# Patient Record
Sex: Female | Born: 1950 | Race: Black or African American | Hispanic: No | Smoking: Current some day smoker
Health system: Southern US, Community
[De-identification: ages and names within clinical notes are randomized; demographics above are authoritative.]

## PROBLEM LIST (undated history)

## (undated) DIAGNOSIS — R9431 Abnormal electrocardiogram [ECG] [EKG]: Secondary | ICD-10-CM

## (undated) DIAGNOSIS — C139 Malignant neoplasm of hypopharynx, unspecified: Secondary | ICD-10-CM

## (undated) NOTE — *Deleted (*Deleted)
Ms. 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 *** Using fluoride trays daily? *** Last ENT visit was on: Not since diagnosis Other notable issues, if any: ***  **vitals**

---

## 1999-01-21 ENCOUNTER — Emergency Department (HOSPITAL_COMMUNITY): Admission: EM | Admit: 1999-01-21 | Discharge: 1999-01-21 | Payer: Self-pay | Admitting: Emergency Medicine

## 2013-11-14 ENCOUNTER — Emergency Department (INDEPENDENT_AMBULATORY_CARE_PROVIDER_SITE_OTHER)
Admission: EM | Admit: 2013-11-14 | Discharge: 2013-11-14 | Disposition: A | Discharge status: Home / Self Care | Payer: 59 | Source: Home / Self Care | Attending: Family Medicine | Admitting: Family Medicine

## 2013-11-14 ENCOUNTER — Encounter (HOSPITAL_COMMUNITY): Payer: Self-pay | Admitting: Emergency Medicine

## 2013-11-14 DIAGNOSIS — B86 Scabies: Secondary | ICD-10-CM

## 2013-11-14 MED ORDER — PERMETHRIN 5 % EX CREA: 1.0000 "application " | TOPICAL_CREAM | Freq: Once | CUTANEOUS | Status: DC

## 2013-11-14 MED ORDER — TRIAMCINOLONE ACETONIDE 0.1 % EX CREA: 1.0000 "application " | TOPICAL_CREAM | Freq: Two times a day (BID) | CUTANEOUS | Status: DC

## 2013-11-14 NOTE — ED Notes (Signed)
Reports rash on arms and abdomen since 7/30.   Denies any recent hotel stays, changes in soaps/detergents, or medications.  No otc treatments tried.

## 2013-11-14 NOTE — ED Provider Notes (Signed)
Tina Kennedy is a 63 y.o. female who presents to Urgent Care today for rash. Patient notes small. Papules scattered across her trunk and extremities. These have been present for about one week. She has not tried any treatment for them yet. She denies any medications fevers chills nausea vomiting or diarrhea. She denies any soaps shampoos detergents etc. She feels well otherwise.   History reviewed. No pertinent past medical history. History  Substance Use Topics  . Smoking status: Current Some Day Smoker  . Smokeless tobacco: Not on file  . Alcohol Use: No   ROS as above Medications: No current facility-administered medications for this encounter.   Current Outpatient Prescriptions  Medication Sig Dispense Refill  . permethrin (ACTICIN) 5 % cream Apply 1 application topically once.  120 g  0  . triamcinolone cream (KENALOG) 0.1 % Apply 1 application topically 2 (two) times daily.  30 g  1    Exam:  BP 158/83  Pulse 84  Temp(Src) 98.7 F (37.1 C) (Oral)  Resp 18  SpO2 95% Gen: Well NAD HEENT: EOMI,  MMM Lungs: Normal work of breathing. CTABL Heart: RRR no MRG Abd: NABS, Soft. Nondistended, Nontender Exts: Brisk capillary refill, warm and well perfused.  Skin: Small scattered excoriated. Papules on trunk and extremities.  No results found for this or any previous visit (from the past 24 hour(s)). No results found.  Assessment and Plan: 63 y.o. female with probable scabies. Plan to treat with permethrin and triamcinolone.  Recommend patient followup with her primary care provider for hypertension.  Discussed warning signs or symptoms. Please see discharge instructions. Patient expresses understanding.   This note was created using Systems analyst. Any transcription errors are unintended.    Gregor Hams, MD 11/14/13 [phone]

## 2013-11-14 NOTE — Discharge Instructions (Signed)
Thank you for coming in today. Apply the permethrin cream from the neck down at night and wash off in the morning. Use Benadryl at night as needed for itching. Use Gold Bond Itch as needed. Come back if not getting better or worsening. Use triamcinolone cream as needed for itching.    Scabies Scabies are small bugs (mites) that burrow under the skin and cause red bumps and severe itching. These bugs can only be seen with a microscope. Scabies are highly contagious. They can spread easily from person to person by direct contact. They are also spread through sharing clothing or linens that have the scabies mites living in them. It is not unusual for an entire family to become infected through shared towels, clothing, or bedding.  HOME CARE INSTRUCTIONS   Your caregiver may prescribe a cream or lotion to kill the mites. If cream is prescribed, massage the cream into the entire body from the neck to the bottom of both feet. Also massage the cream into the scalp and face if your child is less than 38 year old. Avoid the eyes and mouth. Do not wash your hands after application.  Leave the cream on for 8 to 12 hours. Your child should bathe or shower after the 8 to 12 hour application period. Sometimes it is helpful to apply the cream to your child right before bedtime.  One treatment is usually effective and will eliminate approximately 95% of infestations. For severe cases, your caregiver may decide to repeat the treatment in 1 week. Everyone in your household should be treated with one application of the cream.  New rashes or burrows should not appear within 24 to 48 hours after successful treatment. However, the itching and rash may last for 2 to 4 weeks after successful treatment. Your caregiver may prescribe a medicine to help with the itching or to help the rash go away more quickly.  Scabies can live on clothing or linens for up to 3 days. All of your child's recently used clothing, towels,  stuffed toys, and bed linens should be washed in hot water and then dried in a dryer for at least 20 minutes on high heat. Items that cannot be washed should be enclosed in a plastic bag for at least 3 days.  To help relieve itching, bathe your child in a cool bath or apply cool washcloths to the affected areas.  Your child may return to school after treatment with the prescribed cream. SEEK MEDICAL CARE IF:   The itching persists longer than 4 weeks after treatment.  The rash spreads or becomes infected. Signs of infection include red blisters or yellow-tan crust. Document Released: 03/28/2005 Document Revised: 06/20/2011 Document Reviewed: 08/06/2008 [facility] Patient Information 2015 [employer], Preemption. This information is not intended to replace advice given to you by your health care provider. Make sure you discuss any questions you have with your health care provider.

## 2013-11-28 ENCOUNTER — Ambulatory Visit: Payer: 59 | Admitting: Internal Medicine

## 2013-12-24 ENCOUNTER — Ambulatory Visit: Payer: 59 | Attending: Internal Medicine | Admitting: Internal Medicine

## 2013-12-24 ENCOUNTER — Encounter: Payer: Self-pay | Admitting: Internal Medicine

## 2013-12-24 VITALS — BP 134/80 | HR 85 | Temp 98.9°F | Resp 16 | Ht 65.0 in | Wt 155.0 lb

## 2013-12-24 DIAGNOSIS — Z2821 Immunization not carried out because of patient refusal: Secondary | ICD-10-CM

## 2013-12-24 DIAGNOSIS — Z0000 Encounter for general adult medical examination without abnormal findings: Secondary | ICD-10-CM | POA: Diagnosis not present

## 2013-12-24 DIAGNOSIS — F17200 Nicotine dependence, unspecified, uncomplicated: Secondary | ICD-10-CM | POA: Diagnosis not present

## 2013-12-24 DIAGNOSIS — H538 Other visual disturbances: Secondary | ICD-10-CM | POA: Diagnosis not present

## 2013-12-24 DIAGNOSIS — Z23 Encounter for immunization: Secondary | ICD-10-CM

## 2013-12-24 LAB — POCT URINALYSIS DIPSTICK
Bilirubin, UA: NEGATIVE
Glucose, UA: NEGATIVE
Ketones, UA: NEGATIVE
NITRITE UA: NEGATIVE
PH UA: 7
PROTEIN UA: NEGATIVE
RBC UA: NEGATIVE
Spec Grav, UA: 1.01
UROBILINOGEN UA: 1

## 2013-12-24 MED ORDER — BENZONATATE 100 MG PO CAPS: 100.0000 mg | ORAL_CAPSULE | Freq: Two times a day (BID) | ORAL | Status: DC | PRN

## 2013-12-24 NOTE — Progress Notes (Signed)
Pt is here to establish care. Pt is requesting a physical.

## 2013-12-24 NOTE — Progress Notes (Signed)
Patient ID: Tina Kennedy, female   DOB: 10-25-1950, 63 y.o.   MRN: [id number]   [id number]  [id number]  DOB - 01-13-1951  CC:  Chief Complaint  Patient presents with  . Establish Care       HPI: Tina Kennedy is a 63 y.o. female here today to establish medical care.  Patient presents today with no past medical history for a annual physical exam.  She states that her last pap smear and mammogram where both three years ago and were both normal.  She states that she has never had a colonoscopy and is unsure of last Tdap vaccine.  She reports that she does smoke cigarettes and use ocasional marijuana.  She drinks about 4 cans of beer on weekend days.  She is retired but still does some temporary work on the side to help pay her bills.   Patient has No headache, No chest pain, No abdominal pain - No Nausea, No new weakness tingling or numbness, No Cough - SOB.  No Known Allergies History reviewed. No pertinent past medical history. Current Outpatient Prescriptions on File Prior to Visit  Medication Sig Dispense Refill  . permethrin (ACTICIN) 5 % cream Apply 1 application topically once.  120 g  0  . triamcinolone cream (KENALOG) 0.1 % Apply 1 application topically 2 (two) times daily.  30 g  1   No current facility-administered medications on file prior to visit.   Family History  Problem Relation Age of Onset  . Hypertension Mother   . Hypertension Sister    History   Social History  . Marital Status: Married    Spouse Name: N/A    Number of Children: N/A  . Years of Education: N/A   Occupational History  . Not on file.   Social History Main Topics  . Smoking status: Current Some Day Smoker  . Smokeless tobacco: Not on file  . Alcohol Use: No  . Drug Use: Yes    Special: Marijuana  . Sexual Activity: Yes   Other Topics Concern  . Not on file   Social History Narrative  . No narrative on file   Review of Systems  Constitutional: Negative.   HENT:  Negative.   Eyes: Positive for blurred vision.  Respiratory: Positive for cough and sputum production (occasionally). Negative for shortness of breath and wheezing.   Cardiovascular: Negative.   Gastrointestinal: Negative.   Genitourinary: Negative.   Musculoskeletal: Negative.   Skin: Negative.   Neurological: Negative.   Endo/Heme/Allergies: Negative.   Psychiatric/Behavioral: Positive for substance abuse. Negative for depression. The patient is not nervous/anxious.        Objective:   Filed Vitals:   12/24/13 1405  BP: 134/80  Pulse: 85  Temp: 98.9 F (37.2 C)  Resp: 16    Physical Exam: Constitutional: Patient appears well-developed and well-nourished. No distress. HENT: Normocephalic, atraumatic, External right and left ear normal. Oropharynx is clear and moist.  Eyes: Conjunctivae and EOM are normal. PERRLA, no scleral icterus. Neck: Normal ROM. Neck supple. No JVD. No tracheal deviation. No thyromegaly. CVS: RRR, S1/S2 +, no murmurs, no gallops, no carotid bruit.  Pulmonary: Effort and breath sounds normal, no stridor, rhonchi, wheezes, rales.  Abdominal: Soft. BS +, no distension, tenderness, rebound or guarding.  Musculoskeletal: Normal range of motion. No edema and no tenderness.  Lymphadenopathy: No lymphadenopathy noted, cervical Neuro: Alert. Normal reflexes, muscle tone coordination. No cranial nerve deficit. Skin: Skin is warm and dry. No rash noted. Not diaphoretic.  No erythema. No pallor. Psychiatric: Normal mood and affect. Behavior, judgment, thought content normal.  No results found for this basename: WBC, HGB, HCT, MCV, PLT   No results found for this basename: CREATININE, BUN, NA, K, CL, CO2    No results found for this basename: HGBA1C   Lipid Panel  No results found for this basename: chol, trig, hdl, cholhdl, vldl, ldlcalc       Assessment and plan:   Ramaya was seen today for establish care.  Diagnoses and associated orders for this  visit:  Annual physical exam - POCT urinalysis dipstick - CBC; Future - COMPLETE METABOLIC PANEL WITH GFR; Future - Lipid panel; Future - TSH; Future - Hemoglobin A1C; Future - HM COLONOSCOPY - MM Digital Screening; Future - Tdap vaccine greater than or equal to 7yo IM  Tobacco use disorder Smoking cessation discussed in detail  Influenza vaccine refused Explained benefits of vaccine    Return for need pap scheduled.     Chari Manning, NP-C [facility] [phone] 01/06/2014 9:59 PM

## 2014-07-11 ENCOUNTER — Encounter (HOSPITAL_COMMUNITY): Payer: Self-pay

## 2014-07-11 ENCOUNTER — Emergency Department (HOSPITAL_COMMUNITY)
Admission: EM | Admit: 2014-07-11 | Discharge: 2014-07-11 | Disposition: A | Discharge status: Home / Self Care | Payer: 59 | Attending: Emergency Medicine | Admitting: Emergency Medicine

## 2014-07-11 DIAGNOSIS — Z720 Tobacco use: Secondary | ICD-10-CM | POA: Insufficient documentation

## 2014-07-11 DIAGNOSIS — M5431 Sciatica, right side: Secondary | ICD-10-CM

## 2014-07-11 DIAGNOSIS — Z79899 Other long term (current) drug therapy: Secondary | ICD-10-CM | POA: Insufficient documentation

## 2014-07-11 MED ORDER — NAPROXEN 500 MG PO TABS: 500.0000 mg | ORAL_TABLET | Freq: Two times a day (BID) | ORAL | Status: DC

## 2014-07-11 MED ORDER — HYDROCODONE-ACETAMINOPHEN 5-325 MG PO TABS: 1.0000 | ORAL_TABLET | Freq: Four times a day (QID) | ORAL | Status: DC | PRN

## 2014-07-11 NOTE — ED Provider Notes (Signed)
CSN: [id number]     Arrival date & time 07/11/14  0849 History   First MD Initiated Contact with Patient 07/11/14 [phone]     Chief Complaint  Patient presents with  . Rectal Pain     (Consider location/radiation/quality/duration/timing/severity/associated sxs/prior Treatment) Patient is a 64 y.o. female presenting with leg pain. The history is provided by the patient.  Leg Pain Location:  Buttock Time since incident:  3 weeks Injury: no   Buttock location:  R buttock Pain details:    Quality:  Sharp and shooting   Radiates to:  R leg   Severity:  Moderate   Onset quality:  Gradual   Duration:  3 weeks   Timing:  Constant   Progression:  Worsening Chronicity:  New Relieved by:  Rest Exacerbated by: standing and prolonged sitting. Ineffective treatments:  Heat (otc meds) Associated symptoms: tingling   Associated symptoms: no back pain, no decreased ROM and no muscle weakness     History reviewed. No pertinent past medical history. History reviewed. No pertinent past surgical history. Family History  Problem Relation Age of Onset  . Hypertension Mother   . Hypertension Sister    History  Substance Use Topics  . Smoking status: Current Some Day Smoker    Types: Cigarettes  . Smokeless tobacco: Not on file  . Alcohol Use: No   OB History    No data available     Review of Systems  Musculoskeletal: Negative for back pain.  All other systems reviewed and are negative.     Allergies  Review of patient's allergies indicates no known allergies.  Home Medications   Prior to Admission medications   Medication Sig Start Date End Date Taking? Authorizing Provider  benzonatate (TESSALON) 100 MG capsule Take 1 capsule (100 mg total) by mouth 2 (two) times daily as needed for cough. 12/24/13   Lance Bosch, NP  permethrin (ACTICIN) 5 % cream Apply 1 application topically once. 11/14/13   Gregor Hams, MD  triamcinolone cream (KENALOG) 0.1 % Apply 1 application topically  2 (two) times daily. 11/14/13   Gregor Hams, MD   BP 165/86 mmHg  Pulse 94  Temp(Src) 97.9 F (36.6 C) (Oral)  Resp 18  Ht 5\' 5"  (1.651 m)  Wt 140 lb (63.504 kg)  BMI 23.30 kg/m2  SpO2 95% Physical Exam  Constitutional: She is oriented to person, place, and time. She appears well-developed and well-nourished. No distress.  HENT:  Head: Normocephalic and atraumatic.  Eyes: EOM are normal. Pupils are equal, round, and reactive to light.  Cardiovascular: Normal rate.   Pulmonary/Chest: Effort normal.  Abdominal: Soft.  Genitourinary: Rectum normal. Rectal exam shows no external hemorrhoid.  Musculoskeletal: Normal range of motion. She exhibits tenderness.  Tenderness over the sciatic notch.  No induration, fluctuance. No edema  Neurological: She is alert and oriented to person, place, and time. She has normal strength. No cranial nerve deficit or sensory deficit. Gait normal.  Skin: Skin is warm and dry. No rash noted.  Psychiatric: She has a normal mood and affect. Her behavior is normal.  Nursing note and vitals reviewed.   ED Course  Procedures (including critical care time) Labs Review Labs Reviewed - No data to display  Imaging Review No results found.   EKG Interpretation None      MDM   Final diagnoses:  Sciatica, right    Patient here with symptoms most consistent with sciatica. She has pain in her sciatic notch  that radiates into her leg. No difficulty ambulating or weakness. She has no evidence of hemorrhoids or rectal tenderness. Patient was started with anti-inflammatories and given follow-up.    [name], MD 07/11/14 [phone]

## 2014-07-11 NOTE — ED Notes (Signed)
Pt. Having pain on her rt. Buttocks area radiates into her rectum.  She can feel swelling on her rt. Cheek, no bleeing in her bowels last bm was last night.  NOrmal BM

## 2019-06-15 ENCOUNTER — Ambulatory Visit: Payer: Self-pay | Attending: Internal Medicine

## 2019-06-15 DIAGNOSIS — Z23 Encounter for immunization: Secondary | ICD-10-CM | POA: Insufficient documentation

## 2019-06-15 NOTE — Progress Notes (Signed)
   Covid-19 Vaccination Clinic  Name:  Tina Kennedy    MRN: [id number] DOB: 1950-09-13  06/15/2019  [name] was observed post Covid-19 immunization for 15 minutes without incident. She was provided with Vaccine Information Sheet and instruction to access the V-Safe system.   [name] was instructed to call 911 with any severe reactions post vaccine: Marland Kitchen Difficulty breathing  . Swelling of face and throat  . A fast heartbeat  . A bad rash all over body  . Dizziness and weakness   Immunizations Administered    Name Date Dose VIS Date Route   Pfizer COVID-19 Vaccine 06/15/2019 10:38 AM 0.3 mL 03/22/2019 Intramuscular   Manufacturer: [place]   Lot: [id number]   [place]: [id number]

## 2019-07-06 ENCOUNTER — Ambulatory Visit: Payer: Self-pay

## 2019-07-16 ENCOUNTER — Ambulatory Visit: Payer: Self-pay | Attending: Internal Medicine

## 2019-07-16 ENCOUNTER — Ambulatory Visit: Payer: Self-pay

## 2019-07-16 DIAGNOSIS — Z23 Encounter for immunization: Secondary | ICD-10-CM

## 2019-07-16 NOTE — Progress Notes (Signed)
   Covid-19 Vaccination Clinic  Name:  Tina Kennedy    MRN: [id number] DOB: 01/21/1951  07/16/2019  [name] was observed post Covid-19 immunization for 15 minutes without incident. She was provided with Vaccine Information Sheet and instruction to access the V-Safe system.   [name] was instructed to call 911 with any severe reactions post vaccine: Marland Kitchen Difficulty breathing  . Swelling of face and throat  . A fast heartbeat  . A bad rash all over body  . Dizziness and weakness   Immunizations Administered    Name Date Dose VIS Date Route   Pfizer COVID-19 Vaccine 07/16/2019 10:31 AM 0.3 mL 03/22/2019 Intramuscular   Manufacturer: [employer]   Lot: [id number]   [place]: [id number]

## 2019-08-02 ENCOUNTER — Ambulatory Visit (HOSPITAL_COMMUNITY)
Admission: EM | Admit: 2019-08-02 | Discharge: 2019-08-02 | Disposition: A | Discharge status: Home / Self Care | Payer: Medicare Other | Attending: Family Medicine | Admitting: Family Medicine

## 2019-08-02 ENCOUNTER — Other Ambulatory Visit: Payer: Self-pay

## 2019-08-02 ENCOUNTER — Encounter (HOSPITAL_COMMUNITY): Payer: Self-pay

## 2019-08-02 DIAGNOSIS — R1310 Dysphagia, unspecified: Secondary | ICD-10-CM | POA: Diagnosis not present

## 2019-08-02 DIAGNOSIS — R030 Elevated blood-pressure reading, without diagnosis of hypertension: Secondary | ICD-10-CM

## 2019-08-02 NOTE — Discharge Instructions (Signed)
Your blood pressure was noted to be elevated during your visit today. If you are currently taking medication for high blood pressure, please ensure you are taking this as directed. If you do not have a history of high blood pressure and your blood pressure remains persistently elevated, you may need to begin taking a medication at some point. You may return here within the next few days to recheck if unable to see your primary care provider or if do not have a one.  BP (!) 158/104 (BP Location: Right Arm)   Pulse 80   Temp 98.7 F (37.1 C) (Oral)   Resp 17   SpO2 98%

## 2019-08-02 NOTE — ED Provider Notes (Signed)
[place]   [id number] 08/02/19 Arrival Time: 0908  ASSESSMENT & PLAN:  1. Difficulty swallowing solids   2. Elevated blood pressure reading without diagnosis of hypertension     Benign abdominal exam. No indications for urgent abdominal/pelvic imaging at this time. Discussed.  Discussed need of endoscopy. See f/u below.    Discharge Instructions     Your blood pressure was noted to be elevated during your visit today. If you are currently taking medication for high blood pressure, please ensure you are taking this as directed. If you do not have a history of high blood pressure and your blood pressure remains persistently elevated, you may need to begin taking a medication at some point. You may return here within the next few days to recheck if unable to see your primary care provider or if do not have a one.  BP (!) 158/104 (BP Location: Right Arm)   Pulse 80   Temp 98.7 F (37.1 C) (Oral)   Resp 17   SpO2 98%     Follow-up Information    Schedule an appointment as soon as possible for a visit  with Gastroenterology, [facility].   Contact information: [place] [address] [phone]        Schedule an appointment as soon as possible for a visit  with [facility].   Specialty: Gastroenterology Contact information: [address] [place] [phone]           Reviewed expectations re: course of current medical issues. Questions answered. Outlined signs and symptoms indicating need for more acute intervention. Patient verbalized understanding. After Visit Summary given.   SUBJECTIVE: History from: patient. Tina Kennedy is a 69 y.o. female who presents with complaint of difficulty swallowing foods. Gradual onset; first noticed about one month ago. Has lost a couple of pounds. Tolerating PO fluids. "Just get choked when I try to eat". No voice changes. No abdominal pain.  Otherwise feels well. No h/o GERD. No GERD symptoms. No OTC tx. Symptoms gradually worsening. Normal bowel movements.  No LMP recorded. Patient is postmenopausal.   History reviewed. No pertinent surgical history.  Social History   Tobacco Use  Smoking Status Current Some Day Smoker  . Types: Cigarettes  50 years. Approx 1/2 PPD.  Increased blood pressure noted today. Reports that she has not been treated for hypertension in the past.  She reports no chest pain on exertion, no dyspnea on exertion, no swelling of ankles, no orthostatic dizziness or lightheadedness, no orthopnea or paroxysmal nocturnal dyspnea and no palpitations.  OBJECTIVE:  Vitals:   08/02/19 1005  BP: (!) 158/104  Pulse: 80  Resp: 17  Temp: 98.7 F (37.1 C)  TempSrc: Oral  SpO2: 98%    General appearance: alert, oriented, no acute distress HEENT: [place]; AT; oropharynx moist without obvious abnormalities Lungs: unlabored respirations; clear Abdomen: soft; without distention; without guarding or rebound tenderness Back: FROM at waist Extremities: without LE edema; symmetrical; without gross deformities Skin: warm and dry Neurologic: normal gait Psychological: alert and cooperative; normal mood and affect    No Known Allergies                                             History reviewed. No pertinent past medical history.  Social History   Socioeconomic History  . Marital  status: Married    Spouse name: Not on file  . Number of children: Not on file  . Years of education: Not on file  . Highest education level: Not on file  Occupational History  . Not on file  Tobacco Use  . Smoking status: Current Some Day Smoker    Types: Cigarettes  Substance and Sexual Activity  . Alcohol use: No  . Drug use: Yes    Types: Marijuana  . Sexual activity: Yes  Other Topics Concern  . Not on file  Social History Narrative  . Not on file   Social Determinants of Health   Financial Resource Strain:     . Difficulty of Paying Living Expenses:   Food Insecurity:   . Worried About Charity fundraiser in the Last Year:   . Arboriculturist in the Last Year:   Transportation Needs:   . Film/video editor (Medical):   Marland Kitchen Lack of Transportation (Non-Medical):   Physical Activity:   . Days of Exercise per Week:   . Minutes of Exercise per Session:   Stress:   . Feeling of Stress :   Social Connections:   . Frequency of Communication with Friends and Family:   . Frequency of Social Gatherings with Friends and Family:   . Attends Religious Services:   . Active Member of Clubs or Organizations:   . Attends Archivist Meetings:   Marland Kitchen Marital Status:   Intimate Partner Violence:   . Fear of Current or Ex-Partner:   . Emotionally Abused:   Marland Kitchen Physically Abused:   . Sexually Abused:     Family History  Problem Relation Age of Onset  . Hypertension Mother   . Hypertension Sister      Vanessa Kick, MD 08/02/19 1038

## 2019-08-02 NOTE — ED Triage Notes (Signed)
Pt presents with difficulty swallowing her food and spitting up salivia X 1 month with no complaints of pain.

## 2019-08-08 ENCOUNTER — Other Ambulatory Visit: Payer: Self-pay | Admitting: Physician Assistant

## 2019-08-08 DIAGNOSIS — R1310 Dysphagia, unspecified: Secondary | ICD-10-CM

## 2019-08-14 ENCOUNTER — Ambulatory Visit
Admission: RE | Admit: 2019-08-14 | Discharge: 2019-08-14 | Disposition: A | Discharge status: Home / Self Care | Payer: Medicare Other | Source: Ambulatory Visit | Attending: Physician Assistant | Admitting: Physician Assistant

## 2019-08-14 DIAGNOSIS — R1310 Dysphagia, unspecified: Secondary | ICD-10-CM

## 2019-08-22 ENCOUNTER — Other Ambulatory Visit (HOSPITAL_COMMUNITY): Payer: Self-pay | Admitting: *Deleted

## 2019-08-22 DIAGNOSIS — R1310 Dysphagia, unspecified: Secondary | ICD-10-CM

## 2019-08-27 ENCOUNTER — Other Ambulatory Visit: Payer: Self-pay | Admitting: Physician Assistant

## 2019-08-27 ENCOUNTER — Ambulatory Visit (HOSPITAL_COMMUNITY)
Admission: RE | Admit: 2019-08-27 | Discharge: 2019-08-27 | Disposition: A | Discharge status: Home / Self Care | Payer: Medicare Other | Source: Ambulatory Visit | Attending: Physician Assistant | Admitting: Physician Assistant

## 2019-08-27 ENCOUNTER — Other Ambulatory Visit: Payer: Self-pay

## 2019-08-27 DIAGNOSIS — R1310 Dysphagia, unspecified: Secondary | ICD-10-CM | POA: Insufficient documentation

## 2019-08-27 DIAGNOSIS — R221 Localized swelling, mass and lump, neck: Secondary | ICD-10-CM

## 2019-08-27 NOTE — Progress Notes (Signed)
Modified Barium Swallow Progress Note  Patient Details  Name: Tina Kennedy MRN: [id number] Date of Birth: 1951/02/04  Today's Date: 08/27/2019  Modified Barium Swallow completed.  Full report located under Chart Review in the Imaging Section.  Brief recommendations include the following:  Clinical Impression  Pt presents with a pharyngoesophageal dysphagia secondary to what appears to be a mass-like protrusion extending from his posterior pharyngeal wall and down toward his UES (please see radiologist's note for additional description). This impedes full epiglottic inversion, leading to vallecular residue as well as decreased airway protection. Aspiration/penetration occurs with thin liquids; penetration with nectar thick liquids. No spontaneous cough is elicited. There is also mild residue in the pyriform sinuses secondary to reduced clearance into the UES. Her efficiency and herairway protecton are both improved with a head turn to the right. Recommend continuing the soft solid foods that she has been consuming at home as well as thin liquids, but would utilize a head turn to the left with all intake. Would also recommend additional w/u including ENT consult for anatomical abnormalities noted during this study, particularly since she also has vocal quality changes and unintentional weight loss.   Swallow Evaluation Recommendations   Recommended Consults: Consider ENT evaluation   SLP Diet Recommendations: Dysphagia 3 (Mech soft) solids;Thin liquid   Liquid Administration via: Cup;Straw   Medication Administration: Crushed with puree   Supervision: Patient able to self feed   Compensations: Slow rate;Small sips/bites;Other (Comment)(head turn LEFT)   Postural Changes: Seated upright at 90 degrees   Oral Care Recommendations: Oral care BID         Osie Bond., M.A. [place] Pager [phone] Office [phone]  08/27/2019,1:37 PM

## 2019-09-13 ENCOUNTER — Ambulatory Visit
Admission: RE | Admit: 2019-09-13 | Discharge: 2019-09-13 | Disposition: A | Discharge status: Home / Self Care | Payer: Medicare Other | Source: Ambulatory Visit | Attending: Physician Assistant | Admitting: Physician Assistant

## 2019-09-13 DIAGNOSIS — R221 Localized swelling, mass and lump, neck: Secondary | ICD-10-CM

## 2019-09-13 MED ORDER — IOPAMIDOL (ISOVUE-300) INJECTION 61%
75.0000 mL | Freq: Once | INTRAVENOUS | Status: AC | PRN
  Administered 2019-09-13: 75 mL via INTRAVENOUS

## 2019-10-08 NOTE — H&P (Signed)
HPI:   Tina Kennedy is a 69 y.o. female who presents as a consult Patient.   Referring Provider: Murray Hodgkins,*  Chief complaint: Trouble swallowing.  HPI: History of about 1/2 pack/day smoking and 2-4 beer daily drinking. Over the past 2 months she has lost about 25 pounds because of inability to swallow. She had an upper endoscopy which revealed something in the hypopharynx. 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  PMH/Meds/All/SocHx/FamHx/ROS:   History reviewed. No pertinent past medical history.  Past Surgical History:  Procedure Laterality Date  . NO PAST SURGERIES   No family history of bleeding disorders, wound healing problems or difficulty with anesthesia.   Social History   Socioeconomic History  . Marital status: Unknown  Spouse name: Not on file  . Number of children: Not on file  . Years of education: Not on file  . Highest education level: Not on file  Occupational History  . Not on file  Tobacco Use  . Smoking status: Current Every Day Smoker  Packs/day: 0.50  . Smokeless tobacco: Never Used  Substance and Sexual Activity  . Alcohol use: Not on file  . Drug use: Not on file  . Sexual activity: Not on file  Other Topics Concern  . Not on file  Social History Narrative  . Not on file   Social Determinants of Health   Financial Resource Strain:  . Difficulty of Paying Living Expenses:  Food Insecurity:  . Worried About Charity fundraiser in the Last Year:  . Arboriculturist in the Last Year:  Transportation Needs:  . Film/video editor (Medical):  Marland Kitchen Lack of Transportation (Non-Medical):  Physical Activity:  . Days of Exercise per Week:  . Minutes of Exercise per Session:  Stress:  . Feeling of Stress :  Social Connections:  . Frequency of Communication with Friends and Family:  . Frequency of Social Gatherings with Friends and Family:  . Attends Religious Services:  . Active Member of Clubs or Organizations:  . Attends  Archivist Meetings:  Marland Kitchen Marital Status:   No current outpatient medications on file.  A complete ROS was performed with pertinent positives/negatives noted in the HPI. The remainder of the ROS are negative.   Physical Exam:   BP 143/88  Pulse 89  Temp (!) 96.4 F (35.8 C)  Ht 1.651 m (5\' 5" )  Wt 53.5 kg (118 lb)  BMI 19.64 kg/m   General: Healthy and alert, in no distress, breathing easily. Normal affect. In a pleasant mood. Head: Normocephalic, atraumatic. No masses, or scars. Eyes: Pupils are equal, and reactive to light. Vision is grossly intact. No spontaneous or gaze nystagmus. Ears: Ear canals are clear. Tympanic membranes are intact, with normal landmarks and the middle ears are clear and healthy. Hearing: Grossly normal. Nose: Nasal cavities are clear with healthy mucosa, no polyps or exudate. Airways are patent. Face: No masses or scars, facial nerve function is symmetric. Oral Cavity: No mucosal abnormalities are noted. Tongue with normal mobility. She is edentulous. Oropharynx: Tonsils are symmetric. There is a submucosal soft tissue bulge in the right side consistent with a retropharyngeal node. There are no mucosal masses identified. Tongue base appears normal and healthy. Larynx/Hypopharynx: Intolerant of examination with a mirror. Chest: Deferred Neck: No palpable masses, no cervical adenopathy, no thyroid nodules or enlargement. Neuro: Cranial nerves II-XII with normal function. Balance: Normal gate. Other findings: none.  Independent Review of Additional Tests or Records:  none  Procedures:  Procedure note: Flexible fiberoptic laryngoscopy  Details of the procedure were explained to the patient and all questions were answered.   Procedure:   After anesthetizing the nasal cavity with topical lidocaine and oxymetazoline, the flexible endoscope was introduced and passed through the nasal cavity into the nasopharynx. The scope was then advanced to  the level of the oropharynx, then the hypopharynx and larynx. Scope number 10 was used.  Findings:   The posterior soft palate, uvula, tongue base and vallecula were visualized and appeared healthy without mucosal masses or lesions. The epiglottis appears normal. There is pooling of secretions throughout the larynx. There is a fullness of the arytenoids and post cricoid area into the esophageal introitus where there appears to be a large mass. I can visualize the cords but I cannot see well enough to assess the mobility. There also appears to be a growth either contiguous or separate within the glottis.  The scope was withdrawn from the nose. She tolerated the procedure well.   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 Possibly a second primary in the larynx. Recommend direct laryngoscopy and esophagoscopy with biopsy at the hospital under general anesthesia. We can do this as an outpatient although we did discuss there is a small chance of requiring tracheostomy for safe airway. We will schedule this as soon as possible. Urged her to try to stop smoking and drinking.

## 2019-10-08 NOTE — Progress Notes (Signed)
[facility] [place], [address] AT [place] [address] Phone: [phone] Fax: [place], [address] [place] [address] Phone: [phone] Fax: [phone]      Your procedure is scheduled on Tuesday July 6  Report to [facility] Main Entrance "A" at 0530 A.M., and check in at the Admitting office.  Call this number if you have problems the morning of surgery:  [phone]  Call [phone] if you have any questions prior to your surgery date Monday-Friday 8am-4pm    Remember:  Do not eat or drink after midnight the night before your surgery     Take these medicines the morning of surgery with A SIP OF WATER NONE  As of today, STOP taking any Aspirin (unless otherwise instructed by your surgeon) and Aspirin containing products, Aleve, Naproxen, Ibuprofen, Motrin, Advil, Goody's, BC's, all herbal medications, fish oil, and all vitamins.                      Do not wear jewelry, make up, or nail polish            Do not wear lotions, powders, perfumes, or deodorant.            Do not shave 48 hours prior to surgery.              Do not bring valuables to the hospital.            [facility] is not responsible for any belongings or valuables.  Do NOT Smoke (Tobacco/Vapping) or drink Alcohol 24 hours prior to your procedure If you use a CPAP at night, you may bring all equipment for your overnight stay.   Contacts, glasses, dentures or bridgework may not be worn into surgery.      For patients admitted to the hospital, discharge time will be determined by your treatment team.   Patients discharged the day of surgery will not be allowed to drive home, and someone needs to stay with them for 24 hours.    Special instructions:   [place]- Preparing For Surgery  Before surgery, you can play an important  role. Because skin is not sterile, your skin needs to be as free of germs as possible. You can reduce the number of germs on your skin by washing with CHG (chlorahexidine gluconate) Soap before surgery.  CHG is an antiseptic cleaner which kills germs and bonds with the skin to continue killing germs even after washing.    Oral Hygiene is also important to reduce your risk of infection.  Remember - BRUSH YOUR TEETH THE MORNING OF SURGERY WITH YOUR REGULAR TOOTHPASTE  Please do not use if you have an allergy to CHG or antibacterial soaps. If your skin becomes reddened/irritated stop using the CHG.  Do not shave (including legs and underarms) for at least 48 hours prior to first CHG shower. It is OK to shave your face.  Please follow these instructions carefully.   1. Shower the NIGHT BEFORE SURGERY and the MORNING OF SURGERY with CHG Soap.   2. If you chose to wash your hair, wash your hair first as usual with your normal shampoo.  3. After you shampoo, rinse your hair and body thoroughly to remove the shampoo.  4. Use CHG as you would any other  liquid soap. You can apply CHG directly to the skin and wash gently with a scrungie or a clean washcloth.   5. Apply the CHG Soap to your body ONLY FROM THE NECK DOWN.  Do not use on open wounds or open sores. Avoid contact with your eyes, ears, mouth and genitals (private parts). Wash Face and genitals (private parts)  with your normal soap.   6. Wash thoroughly, paying special attention to the area where your surgery will be performed.  7. Thoroughly rinse your body with warm water from the neck down.  8. DO NOT shower/wash with your normal soap after using and rinsing off the CHG Soap.  9. Pat yourself dry with a CLEAN TOWEL.  10. Wear CLEAN PAJAMAS to bed the night before surgery, wear comfortable clothes the morning of surgery  11. Place CLEAN SHEETS on your bed the night of your first shower and DO NOT SLEEP WITH PETS.   Day of Surgery:    Do not apply any deodorants/lotions.  Please wear clean clothes to the hospital/surgery center.   Remember to brush your teeth WITH YOUR REGULAR TOOTHPASTE.   Please read over the following fact sheets that you were given.

## 2019-10-09 ENCOUNTER — Other Ambulatory Visit: Payer: Self-pay

## 2019-10-09 ENCOUNTER — Encounter (HOSPITAL_COMMUNITY)
Admission: RE | Admit: 2019-10-09 | Discharge: 2019-10-09 | Disposition: A | Discharge status: Home / Self Care | Payer: Medicare Other | Source: Ambulatory Visit | Attending: Otolaryngology | Admitting: Otolaryngology

## 2019-10-09 ENCOUNTER — Encounter (HOSPITAL_COMMUNITY): Payer: Self-pay

## 2019-10-09 DIAGNOSIS — Z01812 Encounter for preprocedural laboratory examination: Secondary | ICD-10-CM | POA: Insufficient documentation

## 2019-10-09 LAB — CBC
HCT: 49.6 % — ABNORMAL HIGH (ref 36.0–46.0)
Hemoglobin: 17 g/dL — ABNORMAL HIGH (ref 12.0–15.0)
MCH: 28.1 pg (ref 26.0–34.0)
MCHC: 34.3 g/dL (ref 30.0–36.0)
MCV: 82.1 fL (ref 80.0–100.0)
Platelets: 397 10*3/uL (ref 150–400)
RBC: 6.04 MIL/uL — ABNORMAL HIGH (ref 3.87–5.11)
RDW: 13.7 % (ref 11.5–15.5)
WBC: 6.1 10*3/uL (ref 4.0–10.5)
nRBC: 0 % (ref 0.0–0.2)

## 2019-10-09 NOTE — Progress Notes (Signed)
PCP - Does not have one  Cardiologist - Denies  Chest x-ray - Not indicated EKG - Not indicated Stress Test - Denies ECHO - Denies Cardiac Cath - Denies  Sleep Study - Denies   DM - Denies  Blood Thinner Instructions:  Aspirin Instructions: given  COVID TEST- DOS   Anesthesia review: No  Patient denies shortness of breath, fever, cough and chest pain at PAT appointment   All instructions explained to the patient, with a verbal understanding of the material. Patient agrees to go over the instructions while at home for a better understanding. Patient also instructed to self quarantine after being tested for COVID-19. The opportunity to ask questions was provided.

## 2019-10-14 NOTE — Anesthesia Preprocedure Evaluation (Addendum)
Anesthesia Evaluation  Patient identified by MRN, date of birth, ID band Patient awake    Reviewed: Allergy & Precautions, H&P , NPO status , Patient's Chart, lab work & pertinent test results  Airway Mallampati: II  TM Distance: >3 FB Neck ROM: Full    Dental no notable dental hx. (+) Edentulous Upper, Edentulous Lower, Dental Advisory Given   Pulmonary Current Smoker and Patient abstained from smoking.,    Pulmonary exam normal breath sounds clear to auscultation       Cardiovascular Exercise Tolerance: Good negative cardio ROS   Rhythm:Regular Rate:Normal     Neuro/Psych negative neurological ROS  negative psych ROS   GI/Hepatic negative GI ROS, Neg liver ROS,   Endo/Other  negative endocrine ROS  Renal/GU negative Renal ROS  negative genitourinary   Musculoskeletal   Abdominal   Peds  Hematology negative hematology ROS (+)   Anesthesia Other Findings   Reproductive/Obstetrics negative OB ROS                            Anesthesia Physical Anesthesia Plan  ASA: II  Anesthesia Plan: General   Post-op Pain Management:    Induction: Intravenous  PONV Risk Score and Plan: 3 and Ondansetron, Dexamethasone and Midazolam  Airway Management Planned: Oral ETT  Additional Equipment:   Intra-op Plan:   Post-operative Plan: Extubation in OR  Informed Consent: I have reviewed the patients History and Physical, chart, labs and discussed the procedure including the risks, benefits and alternatives for the proposed anesthesia with the patient or authorized representative who has indicated his/her understanding and acceptance.     Dental advisory given  Plan Discussed with: CRNA  Anesthesia Plan Comments:        Anesthesia Quick Evaluation

## 2019-10-15 ENCOUNTER — Ambulatory Visit (HOSPITAL_COMMUNITY)
Admission: RE | Admit: 2019-10-15 | Discharge: 2019-10-15 | Disposition: A | Discharge status: Home / Self Care | Payer: Medicare Other | Attending: Otolaryngology | Admitting: Otolaryngology

## 2019-10-15 ENCOUNTER — Encounter (HOSPITAL_COMMUNITY): Payer: Self-pay | Admitting: Otolaryngology

## 2019-10-15 ENCOUNTER — Ambulatory Visit (HOSPITAL_COMMUNITY): Payer: Medicare Other | Admitting: Anesthesiology

## 2019-10-15 ENCOUNTER — Other Ambulatory Visit: Payer: Self-pay

## 2019-10-15 ENCOUNTER — Encounter (HOSPITAL_COMMUNITY)
Admission: RE | Disposition: A | Discharge status: Home / Self Care | Payer: Self-pay | Source: Home / Self Care | Attending: Otolaryngology

## 2019-10-15 DIAGNOSIS — C140 Malignant neoplasm of pharynx, unspecified: Secondary | ICD-10-CM | POA: Diagnosis not present

## 2019-10-15 DIAGNOSIS — Z20822 Contact with and (suspected) exposure to covid-19: Secondary | ICD-10-CM | POA: Insufficient documentation

## 2019-10-15 DIAGNOSIS — R1310 Dysphagia, unspecified: Secondary | ICD-10-CM | POA: Diagnosis not present

## 2019-10-15 DIAGNOSIS — F17200 Nicotine dependence, unspecified, uncomplicated: Secondary | ICD-10-CM | POA: Diagnosis not present

## 2019-10-15 DIAGNOSIS — J381 Polyp of vocal cord and larynx: Secondary | ICD-10-CM | POA: Diagnosis not present

## 2019-10-15 DIAGNOSIS — R221 Localized swelling, mass and lump, neck: Secondary | ICD-10-CM | POA: Diagnosis present

## 2019-10-15 HISTORY — PX: DIRECT LARYNGOSCOPY: SHX5326

## 2019-10-15 HISTORY — PX: RIGID ESOPHAGOSCOPY: SHX5226

## 2019-10-15 LAB — SARS CORONAVIRUS 2 BY RT PCR (HOSPITAL ORDER, PERFORMED IN ~~LOC~~ HOSPITAL LAB): SARS Coronavirus 2: NEGATIVE

## 2019-10-15 SURGERY — LARYNGOSCOPY, DIRECT
Anesthesia: General | Site: Throat

## 2019-10-15 MED ORDER — ACETAMINOPHEN 10 MG/ML IV SOLN
INTRAVENOUS | Status: AC
  Filled 2019-10-15: qty 100

## 2019-10-15 MED ORDER — SUCCINYLCHOLINE CHLORIDE 200 MG/10ML IV SOSY
PREFILLED_SYRINGE | INTRAVENOUS | Status: AC
  Filled 2019-10-15: qty 10

## 2019-10-15 MED ORDER — LACTATED RINGERS IV SOLN: INTRAVENOUS | Status: DC

## 2019-10-15 MED ORDER — LIDOCAINE 2% (20 MG/ML) 5 ML SYRINGE
INTRAMUSCULAR | Status: DC | PRN
  Administered 2019-10-15: 60 mg via INTRAVENOUS

## 2019-10-15 MED ORDER — DEXAMETHASONE SODIUM PHOSPHATE 10 MG/ML IJ SOLN
INTRAMUSCULAR | Status: AC
  Filled 2019-10-15: qty 1

## 2019-10-15 MED ORDER — FENTANYL CITRATE (PF) 100 MCG/2ML IJ SOLN: 25.0000 ug | INTRAMUSCULAR | Status: DC | PRN

## 2019-10-15 MED ORDER — DEXAMETHASONE SODIUM PHOSPHATE 10 MG/ML IJ SOLN
INTRAMUSCULAR | Status: DC | PRN
  Administered 2019-10-15: 5 mg via INTRAVENOUS

## 2019-10-15 MED ORDER — LABETALOL HCL 5 MG/ML IV SOLN
INTRAVENOUS | Status: DC | PRN
  Administered 2019-10-15 (×2): 5 mg via INTRAVENOUS

## 2019-10-15 MED ORDER — SODIUM CHLORIDE 0.9 % IV SOLN
0.0125 ug/kg/min | INTRAVENOUS | Status: DC
  Administered 2019-10-15: .4 ug/kg/min via INTRAVENOUS
  Filled 2019-10-15: qty 2000

## 2019-10-15 MED ORDER — 0.9 % SODIUM CHLORIDE (POUR BTL) OPTIME
TOPICAL | Status: DC | PRN
  Administered 2019-10-15: 1000 mL

## 2019-10-15 MED ORDER — ORAL CARE MOUTH RINSE: 15.0000 mL | Freq: Once | OROMUCOSAL | Status: AC

## 2019-10-15 MED ORDER — LIDOCAINE 2% (20 MG/ML) 5 ML SYRINGE
INTRAMUSCULAR | Status: AC
  Filled 2019-10-15: qty 5

## 2019-10-15 MED ORDER — MIDAZOLAM HCL 2 MG/2ML IJ SOLN
INTRAMUSCULAR | Status: DC | PRN
  Administered 2019-10-15: 2 mg via INTRAVENOUS

## 2019-10-15 MED ORDER — ONDANSETRON HCL 4 MG/2ML IJ SOLN
INTRAMUSCULAR | Status: DC | PRN
  Administered 2019-10-15: 4 mg via INTRAVENOUS

## 2019-10-15 MED ORDER — FENTANYL CITRATE (PF) 250 MCG/5ML IJ SOLN
INTRAMUSCULAR | Status: AC
  Filled 2019-10-15: qty 5

## 2019-10-15 MED ORDER — CHLORHEXIDINE GLUCONATE 0.12 % MT SOLN
OROMUCOSAL | Status: AC
  Administered 2019-10-15: 15 mL via OROMUCOSAL
  Filled 2019-10-15: qty 15

## 2019-10-15 MED ORDER — EPINEPHRINE HCL (NASAL) 0.1 % NA SOLN
NASAL | Status: DC | PRN
  Administered 2019-10-15: 30 mL via NASAL

## 2019-10-15 MED ORDER — EPINEPHRINE PF 1 MG/ML IJ SOLN
INTRAMUSCULAR | Status: AC
  Filled 2019-10-15: qty 1

## 2019-10-15 MED ORDER — CHLORHEXIDINE GLUCONATE 0.12 % MT SOLN: 15.0000 mL | Freq: Once | OROMUCOSAL | Status: AC

## 2019-10-15 MED ORDER — ACETAMINOPHEN 10 MG/ML IV SOLN
INTRAVENOUS | Status: DC | PRN
Start: 2019-10-15 — End: 2019-10-15
  Administered 2019-10-15: 1000 mg via INTRAVENOUS

## 2019-10-15 MED ORDER — MIDAZOLAM HCL 2 MG/2ML IJ SOLN
INTRAMUSCULAR | Status: AC
  Filled 2019-10-15: qty 2

## 2019-10-15 MED ORDER — EPINEPHRINE HCL (NASAL) 0.1 % NA SOLN
NASAL | Status: AC
  Filled 2019-10-15: qty 30

## 2019-10-15 MED ORDER — LABETALOL HCL 5 MG/ML IV SOLN
INTRAVENOUS | Status: AC
  Filled 2019-10-15: qty 4

## 2019-10-15 MED ORDER — PROPOFOL 10 MG/ML IV BOLUS
INTRAVENOUS | Status: AC
  Filled 2019-10-15: qty 40

## 2019-10-15 MED ORDER — PROPOFOL 10 MG/ML IV BOLUS
INTRAVENOUS | Status: DC | PRN
  Administered 2019-10-15: 50 mg via INTRAVENOUS

## 2019-10-15 MED ORDER — SUCCINYLCHOLINE CHLORIDE 200 MG/10ML IV SOSY
PREFILLED_SYRINGE | INTRAVENOUS | Status: DC | PRN
  Administered 2019-10-15: 80 mg via INTRAVENOUS

## 2019-10-15 MED ORDER — ONDANSETRON HCL 4 MG/2ML IJ SOLN
INTRAMUSCULAR | Status: AC
  Filled 2019-10-15: qty 2

## 2019-10-15 SURGICAL SUPPLY — 28 items
BALLN PULM 15 16.5 18 X 75CM (BALLOONS) ×1
BALLN PULM 15 16.5 18X75 (BALLOONS) ×2
BALLOON PULM 15 16.5 18X75 (BALLOONS) ×1 IMPLANT
CANISTER SUCT 3000ML PPV (MISCELLANEOUS) ×3 IMPLANT
COVER BACK TABLE 60X90IN (DRAPES) ×3 IMPLANT
COVER MAYO STAND STRL (DRAPES) ×3 IMPLANT
COVER WAND RF STERILE (DRAPES) ×3 IMPLANT
DRAPE HALF SHEET 40X57 (DRAPES) ×3 IMPLANT
GAUZE 4X4 16PLY RFD (DISPOSABLE) IMPLANT
GAUZE SPONGE 4X4 12PLY STRL (GAUZE/BANDAGES/DRESSINGS) ×3 IMPLANT
GLOVE ECLIPSE 7.5 STRL STRAW (GLOVE) ×3 IMPLANT
GUARD TEETH (MISCELLANEOUS) IMPLANT
KIT BASIN OR (CUSTOM PROCEDURE TRAY) ×3 IMPLANT
KIT TURNOVER KIT B (KITS) ×3 IMPLANT
MARKER SKIN DUAL TIP RULER LAB (MISCELLANEOUS) ×3 IMPLANT
NEEDLE PRECISIONGLIDE 27X1.5 (NEEDLE) IMPLANT
NS IRRIG 1000ML POUR BTL (IV SOLUTION) ×3 IMPLANT
PAD ARMBOARD 7.5X6 YLW CONV (MISCELLANEOUS) ×6 IMPLANT
PATTIES SURGICAL .5 X3 (DISPOSABLE) IMPLANT
SOL ANTI FOG 6CC (MISCELLANEOUS) IMPLANT
SOLUTION ANTI FOG 6CC (MISCELLANEOUS)
SYR CONTROL 10ML LL (SYRINGE) IMPLANT
SYR TB 1ML LUER SLIP (SYRINGE) IMPLANT
TOWEL GREEN STERILE (TOWEL DISPOSABLE) ×3 IMPLANT
TOWEL GREEN STERILE FF (TOWEL DISPOSABLE) ×3 IMPLANT
TUBE CONNECTING 12'X1/4 (SUCTIONS) ×1
TUBE CONNECTING 12X1/4 (SUCTIONS) ×2 IMPLANT
WATER STERILE IRR 1000ML POUR (IV SOLUTION) ×3 IMPLANT

## 2019-10-15 NOTE — Op Note (Signed)
OPERATIVE REPORT  DATE OF SURGERY: 10/15/2019  PATIENT:  Tina Kennedy,  69 y.o. female  PRE-OPERATIVE DIAGNOSIS: Hypopharyngeal mass  POST-OPERATIVE DIAGNOSIS: Hypopharyngeal, and laryngeal mass  PROCEDURE:  Procedure(s): DIRECT LARYNGOSCOPY w/BIOPSY RIGID ESOPHAGOSCOPY  SURGEON:  Beckie Salts, MD  ASSISTANTS: None  ANESTHESIA:   General   EBL: 20 ml  DRAINS: None  LOCAL MEDICATIONS USED:  None  SPECIMEN: 1.  Anterior commissure mass, 2.  Hypopharyngeal/posterior pharyngeal wall mass  COUNTS:  Correct  PROCEDURE DETAILS: The patient was taken to the operating room and placed on the operating table in the supine position. Following induction of general endotracheal anesthesia, the table was turned 90 degrees and the patient was draped in a standard fashion for endoscopy.  1.  The Jako laryngoscope was used to evaluate the larynx and the hypopharynx.  An incidental finding of a spherical smooth mucosal covered nodule arising from the anterior commissure perhaps from the left anterior cord was identified and was removed with biopsy forceps and sent for pathologic evaluation.  No other lesions were identified within the larynx.  In the hypopharynx there is a large fungating mass involving the posterior pharyngeal wall extending from about 2 cm above the esophageal introitus down into the esophageal introitus.  Multiple biopsies were taken of this and sent for pathologic valuation.  2.  The cervical rigid esophagoscope was then used to evaluate the esophagus.  The lesion ended just below the esophageal introitus.  There are no other lesions identified in the cervical esophagus.  Patient was awakened extubated and transferred to recovery in stable condition.    PATIENT DISPOSITION:  To PACU, stable

## 2019-10-15 NOTE — Transfer of Care (Signed)
Immediate Anesthesia Transfer of Care Note  Patient: Tina Kennedy  Procedure(s) Performed: DIRECT LARYNGOSCOPY w/BIOPSY (N/A Throat) RIGID ESOPHAGOSCOPY (N/A Throat)  Patient Location: PACU  Anesthesia Type:General  Level of Consciousness: alert  and patient cooperative  Airway & Oxygen Therapy: Patient Spontanous Breathing and Patient connected to face mask oxygen  Post-op Assessment: Report given to RN and Post -op Vital signs reviewed and stable  Post vital signs: Reviewed and stable  Last Vitals:  Vitals Value Taken Time  BP 158/92 10/15/19 0840  Temp    Pulse 84 10/15/19 0843  Resp 25 10/15/19 0843  SpO2 91 % 10/15/19 0843  Vitals shown include unvalidated device data.  Last Pain:  Vitals:   10/15/19 0659  TempSrc:   PainSc: 0-No pain         Complications: No complications documented.

## 2019-10-15 NOTE — Progress Notes (Signed)
Tachypneic/ coughing persistently, expectorating  Mod amt of blood tinged sputum

## 2019-10-15 NOTE — Discharge Instructions (Signed)
Drink as much as you are able to.  We will call with results of the biopsies.

## 2019-10-15 NOTE — Interval H&P Note (Signed)
History and Physical Interval Note:  10/15/2019 7:21 AM  Tina Kennedy  has presented today for surgery, with the diagnosis of esophageal mass.  The various methods of treatment have been discussed with the patient and family. After consideration of risks, benefits and other options for treatment, the patient has consented to  Procedure(s): DIRECT LARYNGOSCOPY w/BIOPSY (N/A) RIGID ESOPHAGOSCOPY (N/A) as a surgical intervention.  The patient's history has been reviewed, patient examined, no change in status, stable for surgery.  I have reviewed the patient's chart and labs.  Questions were answered to the patient's satisfaction.     Izora Gala

## 2019-10-15 NOTE — Anesthesia Procedure Notes (Signed)
Procedure Name: Intubation Date/Time: 10/15/2019 7:55 AM Performed by: Kathryne Hitch, CRNA Pre-anesthesia Checklist: Patient identified, Emergency Drugs available, Suction available and Patient being monitored Patient Re-evaluated:Patient Re-evaluated prior to induction Oxygen Delivery Method: Circle system utilized Preoxygenation: Pre-oxygenation with 100% oxygen Induction Type: IV induction Ventilation: Oral airway inserted - appropriate to patient size and Two handed mask ventilation required Laryngoscope Size: Miller and 2 Grade View: Grade I Tube type: Oral Tube size: 7.0 mm Number of attempts: 1 Airway Equipment and Method: Stylet and Oral airway Placement Confirmation: ETT inserted through vocal cords under direct vision,  positive ETCO2 and breath sounds checked- equal and bilateral Secured at: 21 cm Tube secured with: Tape Dental Injury: Teeth and Oropharynx as per pre-operative assessment

## 2019-10-16 ENCOUNTER — Encounter (HOSPITAL_COMMUNITY): Payer: Self-pay | Admitting: Otolaryngology

## 2019-10-16 LAB — SURGICAL PATHOLOGY

## 2019-10-16 NOTE — Anesthesia Postprocedure Evaluation (Signed)
Anesthesia Post Note  Patient: Tina Kennedy  Procedure(s) Performed: DIRECT LARYNGOSCOPY w/BIOPSY (N/A Throat) RIGID ESOPHAGOSCOPY (N/A Throat)     Patient location during evaluation: PACU Anesthesia Type: General Level of consciousness: awake and alert Pain management: pain level controlled Vital Signs Assessment: post-procedure vital signs reviewed and stable Respiratory status: spontaneous breathing, nonlabored ventilation and respiratory function stable Cardiovascular status: blood pressure returned to baseline and stable Postop Assessment: no apparent nausea or vomiting Anesthetic complications: no   No complications documented.  Last Vitals:  Vitals:   10/15/19 0940 10/15/19 0950  BP: 122/82   Pulse: 80   Resp: (!) 27   Temp:  (!) 36.3 C  SpO2: 93%     Last Pain:  Vitals:   10/15/19 0950  TempSrc:   PainSc: 0-No pain                 Chantry Headen,W. EDMOND

## 2019-10-23 ENCOUNTER — Telehealth: Payer: Self-pay | Admitting: Hematology

## 2019-10-23 NOTE — Telephone Encounter (Signed)
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 Tina Kennedy has been cld and scheduled to see Dr. Burr Medico on 7/19 at 3pm. Pt aware to arrive 15 minutes early.

## 2019-10-24 ENCOUNTER — Other Ambulatory Visit (HOSPITAL_COMMUNITY): Payer: Self-pay | Admitting: Otolaryngology

## 2019-10-24 ENCOUNTER — Other Ambulatory Visit: Payer: Self-pay | Admitting: Otolaryngology

## 2019-10-24 DIAGNOSIS — C139 Malignant neoplasm of hypopharynx, unspecified: Secondary | ICD-10-CM

## 2019-10-24 NOTE — Progress Notes (Signed)
Oncology Nurse Navigator Documentation  Placed introductory call to new referral patient Tina Kennedy.   Introduced myself as the H&N oncology nurse navigator that works with Dr. Burr Medico and Dr. Isidore Moos to whom she has been referred by Dr. Constance Holster. She confirmed understanding of referral.  Briefly explained my role as his navigator, provided my contact information.   Confirmed understanding of upcoming appts and [place] location, explained arrival and registration process.  I explained the purpose of a dental evaluation prior to starting RT, indicated she would be contacted by [facility] to arrange an appt.    I encouraged her to call with questions/concerns as he moves forward with appts and procedures.    He verbalized understanding of information provided, expressed appreciation for my call.   Navigator Initial Assessment . Employment Status: She is not working at this time. She was working at [employer] until recently.  . Currently on FMLA / STD: no . Living Situation: She lives alone.  . Support System: She reports multiple family members who live close by. She has one son.  Marland Kitchen PCP: none . PCD: none, she is edentulous . Financial Concerns: not now . Transportation Needs: no . Sensory Deficits: none . Language Barriers/Interpreter Needed:  no . Ambulation Needs: no . DME Used in Home: no . Psychosocial Needs:  no . Concerns/Needs Understanding Cancer:  addressed/answered by navigator to best of ability . Self-Expressed Needs: no  Harlow Asa RN, BSN, OCN Head & Neck Oncology Nurse [place] at [facility] Phone # [phone]  Fax # [phone]

## 2019-10-28 ENCOUNTER — Telehealth: Payer: Self-pay | Admitting: Oncology

## 2019-10-28 ENCOUNTER — Inpatient Hospital Stay: Payer: Medicare Other | Admitting: Hematology

## 2019-10-28 DIAGNOSIS — C139 Malignant neoplasm of hypopharynx, unspecified: Secondary | ICD-10-CM | POA: Insufficient documentation

## 2019-10-28 NOTE — Telephone Encounter (Signed)
Pt has been cld and rescheduled to see Dr. Alen Blew on 7/22 at 2pm. Ms. Morning has been made aware to arrive 15 minutes early.

## 2019-10-31 ENCOUNTER — Inpatient Hospital Stay: Payer: Medicare Other | Attending: Hematology | Admitting: Oncology

## 2019-10-31 ENCOUNTER — Telehealth: Payer: Self-pay | Admitting: Oncology

## 2019-10-31 VITALS — BP 169/101 | HR 104 | Temp 97.3°F | Resp 18 | Ht 65.0 in | Wt 110.1 lb

## 2019-10-31 DIAGNOSIS — Z7189 Other specified counseling: Secondary | ICD-10-CM | POA: Diagnosis not present

## 2019-10-31 DIAGNOSIS — F17210 Nicotine dependence, cigarettes, uncomplicated: Secondary | ICD-10-CM | POA: Insufficient documentation

## 2019-10-31 DIAGNOSIS — R1310 Dysphagia, unspecified: Secondary | ICD-10-CM | POA: Diagnosis not present

## 2019-10-31 DIAGNOSIS — C139 Malignant neoplasm of hypopharynx, unspecified: Secondary | ICD-10-CM | POA: Insufficient documentation

## 2019-10-31 MED ORDER — LIDOCAINE-PRILOCAINE 2.5-2.5 % EX CREA
1.0000 | TOPICAL_CREAM | CUTANEOUS | 0 refills | Status: DC | PRN
Start: 2019-10-31 — End: 2020-07-23

## 2019-10-31 MED ORDER — PROCHLORPERAZINE MALEATE 10 MG PO TABS
10.0000 mg | ORAL_TABLET | Freq: Four times a day (QID) | ORAL | 0 refills | Status: DC | PRN
Start: 2019-10-31 — End: 2020-07-23

## 2019-10-31 NOTE — Progress Notes (Signed)
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 

## 2019-10-31 NOTE — Progress Notes (Signed)
Oncology Nurse Navigator Documentation  I called and spoke to Tina Kennedy. I informed her of her upcoming PET scan on 11/07/19 at 1:00 pm at [facility]. She is aware to arrive at 12:30, nothing except water 6 hours before arrival time and no gum or candy. She is also aware no carbohydrates the meal the night before the appointment. She was also made aware of the lab appointment at [facility] the same day at 10:30. She voiced her understanding and wrote the instructions down as I was speaking with her. She knows she can call me if she has any further questions or concerns.   Harlow Asa RN, BSN, OCN Head & Neck Oncology Nurse [place] at [facility] Phone # [phone]  Fax # [phone]

## 2019-10-31 NOTE — Progress Notes (Signed)
Oncology Nurse Navigator Documentation  Met with patient after initial consult with Dr. Alen Blew.  . Further introduced myself as his/their Navigator, explained my role as a member of the Care Team. . Provided New Patient Information packet: o Contact information for physician, this navigator, other members of the Care Team o Advance Directive information ([place] blue pamphlet with LCSW insert); provided [facility] AD booklet at his request, encouraged him to contact Gwinda [place], LCSW, to complete. o Fall Prevention Patient [place] sheet o Symptom Management Clinic information o [facility] campus map with highlight of [place] o SLP Information sheet . Provided and discussed educational handouts for PEG and PAC. Marland Kitchen Assisted with post-consult appt scheduling.  . She verbalized understanding of information provided. . I encouraged them to call with questions/concerns moving forward.  Harlow Asa, RN, BSN, OCN Head & Neck Oncology Nurse [place] at [place] [phone]

## 2019-10-31 NOTE — Progress Notes (Signed)
Reason for the request: Hypopharyngeal cancer  HPI: I was asked by Dr. Constance Holster  to evaluate Tina Kennedy for evaluation of head and neck cancer.  She is a 69 year old woman with history of tobacco use but does not seek routine medical care.  She presented with dysphagia and weight loss for the last 2 months.  She has lost close to 30 pounds in the interim. She was evaluated by Dr. Paulita Fujita subsequently evaluated by Dr. Constance Holster.  She underwent direct laryngoscopy and a biopsy completed on July 6 of 2021.  Direct visualization noted to have a large fungating mass involving the posterior pharyngeal wall extending from about 2 cm above the esophageal enteritis.  Biopsies obtained which showed invasive poorly differentiated squamous cell carcinoma.  Based on these findings he was referred to me for evaluation.  CT scan of the neck obtained on 09/13/2019 showed a large hypopharyngeal tumor with parapharyngeal lymph node involvement but no lymph node involvement noted.  Clinically, she reports dysphagia but is able to eat soft foods and adequate hydration.  She denies any neck pain or otalgia.  She denies any shortness of breath or difficulty breathing.  She continues to live independently and attends to activities of daily living.   She does not report any headaches, blurry vision, syncope or seizures. Does not report any fevers, chills or sweats.  Does not report any cough, wheezing or hemoptysis.  Does not report any chest pain, palpitation, orthopnea or leg edema.  Does not report any nausea, vomiting or abdominal pain.  Does not report any constipation or diarrhea.  Does not report any skeletal complaints.    Does not report frequency, urgency or hematuria.  Does not report any skin rashes or lesions. Does not report any heat or cold intolerance.  Does not report any lymphadenopathy or petechiae.  Does not report any anxiety or depression.  Remaining review of systems is negative.    No past medical history on  file.:  Past Surgical History:  Procedure Laterality Date  . DIRECT LARYNGOSCOPY N/A 10/15/2019   Procedure: DIRECT LARYNGOSCOPY w/BIOPSY;  Surgeon: Izora Gala, MD;  Location: [facility];  Service: ENT;  Laterality: N/A;  . RIGID ESOPHAGOSCOPY N/A 10/15/2019   Procedure: RIGID ESOPHAGOSCOPY;  Surgeon: Izora Gala, MD;  Location: [facility];  Service: ENT;  Laterality: N/A;  :   Current Outpatient Medications:  .  Aspirin-Salicylamide-Caffeine (BC HEADACHE POWDER PO), Take 1 packet by mouth daily as needed (pain/headaches.)., Disp: , Rfl: :  No Known Allergies:  Family History  Problem Relation Age of Onset  . Hypertension Mother   . Hypertension Sister   :  Social History   Socioeconomic History  . Marital status: Divorced    Spouse name: Not on file  . Number of children: Not on file  . Years of education: Not on file  . Highest education level: Not on file  Occupational History  . Not on file  Tobacco Use  . Smoking status: Current Some Day Smoker    Packs/day: 0.50    Years: 10.00    Pack years: 5.00    Types: Cigarettes  . Smokeless tobacco: Never Used  Vaping Use  . Vaping Use: Never used  Substance and Sexual Activity  . Alcohol use: Yes    Alcohol/week: 1.0 standard drink    Types: 1 Cans of beer per week    Comment: Occasional on weekend  . Drug use: Yes    Types: Marijuana    Comment: occasionally  .  Sexual activity: Yes  Other Topics Concern  . Not on file  Social History Narrative  . Not on file   Social Determinants of Health   Financial Resource Strain:   . Difficulty of Paying Living Expenses:   Food Insecurity:   . Worried About Charity fundraiser in the Last Year:   . Arboriculturist in the Last Year:   Transportation Needs:   . Film/video editor (Medical):   Marland Kitchen Lack of Transportation (Non-Medical):   Physical Activity:   . Days of Exercise per Week:   . Minutes of Exercise per Session:   Stress:   . Feeling of Stress :   Social  Connections:   . Frequency of Communication with Friends and Family:   . Frequency of Social Gatherings with Friends and Family:   . Attends Religious Services:   . Active Member of Clubs or Organizations:   . Attends Archivist Meetings:   Marland Kitchen Marital Status:   Intimate Partner Violence:   . Fear of Current or Ex-Partner:   . Emotionally Abused:   Marland Kitchen Physically Abused:   . Sexually Abused:   :  Pertinent items are noted in HPI.  Exam: Blood pressure (!) 169/101, pulse 104, temperature (!) 97.3 F (36.3 C), temperature source Temporal, resp. rate 18, height 5\' 5"  (1.651 m), weight 110 lb 1.6 oz (49.9 kg), SpO2 98 %.  ECOG 1  General appearance: alert and cooperative appeared without distress. Head: atraumatic without any abnormalities. Eyes: conjunctivae/corneas clear. PERRL.  Sclera anicteric. Throat: lips, mucosa, and tongue normal; without oral thrush or ulcers.  Right hypopharyngeal protrusion noted. Resp: clear to auscultation bilaterally without rhonchi, wheezes or dullness to percussion. Cardio: regular rate and rhythm, S1, S2 normal, no murmur, click, rub or gallop GI: soft, non-tender; bowel sounds normal; no masses,  no organomegaly Skin: Skin color, texture, turgor normal. No rashes or lesions Lymph nodes: Cervical, supraclavicular, and axillary nodes normal. Neurologic: Grossly normal without any motor, sensory or deep tendon reflexes. Musculoskeletal: No joint deformity or effusion.  IMPRESSION: 1. Bulky enlargement and heterogeneity of the cervical esophagus most compatible with Esophageal Carcinoma. Associated large, malignant appearing right retropharyngeal lymph node (4 cm long axis). But no other nodal metastases is identified in the neck or upper chest.  2. Several indeterminate bone lucencies at the skull base and in the cervical spine, but no destructive osseous metastasis is identified.  3. Partially visible right lung  nodule/ground-glass.  4. Attention directed to #2 and #3 on follow-up staging CT or PET-CT.  These results will be called to the ordering clinician or representative by the Radiologist Assistant, and communication documented in the PACS or [employer].  Assessment and Plan:   69 year old woman with:  1.  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  She presented with weight loss and dysphagia and her tumor is biopsy-proven after direct endoscopy and biopsy completed by Dr. Constance Holster.  The natural course of this disease was discussed and management options moving forward were reviewed.  The first step is to complete staging work-up as well which we will obtain a PET scan 4.  Likely she will have stage III disease based on the initial CT scan and examination.  Treatment options were discussed which include definitive therapy with radiation concomitantly with chemotherapy versus induction chemotherapy followed by concurrent chemoradiation.  Surgery will not be in her best option at this point given  the location of this tumor and organ preservation is the preferred method.  After discussion today, I favor the approach of concurrent chemotherapy with radiation as a definitive modality.  She will be a reasonable cisplatin candidate which will be given on a weekly basis for better tolerance.  Complication associated with this treatments include nausea, vomiting, myelosuppression, neutropenia, neutropenic sepsis and renal failure.  Electrolyte imbalance and SIADH are also possibility.  After discussion today, she is motivated for treatment and we will make arrangements to start therapy in the near future.  Chemotherapy education class will be arranged for her.  2.  IV access: Risks and benefits of Port-A-Cath insertion were reviewed.  Complications that include bleeding, thrombosis and infection were  discussed.  She is agreeable to proceed at this time.  Cream will be available to her as well.  3.  Nutrition: She will be at high risk of malnutrition and she is already has lost significant amount of weight.  She will require nutritional consults as well as PEG tube which we will arrange for on the same time of her Port-A-Cath.  4.  Antiemetics: Prescription for Compazine will be made available to her.  5.  Smoking cessation: She is currently smoking close to half a pack a day but she is motivated and has already cut down on her smoking.  Continues to emphasize importance of doing so.  6.  Renal function surveillance: We will obtain that baseline kidney function monitor kidney function closely on platinum based therapy.  7.  Covid vaccination considerations: She is fully vaccinated and completed her vaccination series.  8.  Prognosis and goals of care: Therapy is curative at this time although she understands that this is difficult treatment but her disease remains curable at this time.  9.  Follow-up: Will be determined by the start of her therapy.  60  minutes were dedicated to this visit. The time was spent on reviewing laboratory data, imaging studies, discussing treatment options, and answering questions regarding future plan.       A copy of this consult has been forwarded to the requesting physician.

## 2019-10-31 NOTE — Telephone Encounter (Signed)
Scheduled per 07/22 los, patient received updated calender.

## 2019-11-06 ENCOUNTER — Telehealth: Payer: Self-pay

## 2019-11-06 NOTE — Telephone Encounter (Signed)
Called patient to check on her and see if she had any questions/concerns about her upcoming appointments. Patient willing to review appointments for tomorrow, but was adamant she didn't want to talk about any other appointments because it was too over whelming. Confirmed she knew where to go for her 10:30 lab appointment and then 13:00 PET scan (and need to be NPO 6 hours prior to scan). Patient denied any other needs at this time but knows to call our office should something arise.

## 2019-11-07 ENCOUNTER — Other Ambulatory Visit: Payer: Self-pay

## 2019-11-07 ENCOUNTER — Ambulatory Visit (HOSPITAL_COMMUNITY)
Admission: RE | Admit: 2019-11-07 | Discharge: 2019-11-07 | Disposition: A | Discharge status: Home / Self Care | Payer: Medicare Other | Source: Ambulatory Visit | Attending: Oncology | Admitting: Oncology

## 2019-11-07 ENCOUNTER — Inpatient Hospital Stay: Payer: Medicare Other

## 2019-11-07 DIAGNOSIS — F17210 Nicotine dependence, cigarettes, uncomplicated: Secondary | ICD-10-CM | POA: Diagnosis not present

## 2019-11-07 DIAGNOSIS — I2510 Atherosclerotic heart disease of native coronary artery without angina pectoris: Secondary | ICD-10-CM | POA: Diagnosis not present

## 2019-11-07 DIAGNOSIS — C139 Malignant neoplasm of hypopharynx, unspecified: Secondary | ICD-10-CM | POA: Diagnosis present

## 2019-11-07 DIAGNOSIS — I700 Atherosclerosis of aorta: Secondary | ICD-10-CM | POA: Insufficient documentation

## 2019-11-07 DIAGNOSIS — J439 Emphysema, unspecified: Secondary | ICD-10-CM | POA: Insufficient documentation

## 2019-11-07 LAB — CBC WITH DIFFERENTIAL (CANCER CENTER ONLY)
Abs Immature Granulocytes: 0.02 10*3/uL (ref 0.00–0.07)
Basophils Absolute: 0.1 10*3/uL (ref 0.0–0.1)
Basophils Relative: 1 %
Eosinophils Absolute: 0.1 10*3/uL (ref 0.0–0.5)
Eosinophils Relative: 1 %
HCT: 47.4 % — ABNORMAL HIGH (ref 36.0–46.0)
Hemoglobin: 16.6 g/dL — ABNORMAL HIGH (ref 12.0–15.0)
Immature Granulocytes: 0 %
Lymphocytes Relative: 32 %
Lymphs Abs: 1.7 10*3/uL (ref 0.7–4.0)
MCH: 27.9 pg (ref 26.0–34.0)
MCHC: 35 g/dL (ref 30.0–36.0)
MCV: 79.7 fL — ABNORMAL LOW (ref 80.0–100.0)
Monocytes Absolute: 0.7 10*3/uL (ref 0.1–1.0)
Monocytes Relative: 13 %
Neutro Abs: 2.8 10*3/uL (ref 1.7–7.7)
Neutrophils Relative %: 53 %
Platelet Count: 369 10*3/uL (ref 150–400)
RBC: 5.95 MIL/uL — ABNORMAL HIGH (ref 3.87–5.11)
RDW: 13.2 % (ref 11.5–15.5)
WBC Count: 5.3 10*3/uL (ref 4.0–10.5)
nRBC: 0 % (ref 0.0–0.2)

## 2019-11-07 LAB — CMP (CANCER CENTER ONLY)
ALT: 13 U/L (ref 0–44)
AST: 25 U/L (ref 15–41)
Albumin: 2.8 g/dL — ABNORMAL LOW (ref 3.5–5.0)
Alkaline Phosphatase: 121 U/L (ref 38–126)
Anion gap: 10 (ref 5–15)
BUN: 6 mg/dL — ABNORMAL LOW (ref 8–23)
CO2: 27 mmol/L (ref 22–32)
Calcium: 9.9 mg/dL (ref 8.9–10.3)
Chloride: 97 mmol/L — ABNORMAL LOW (ref 98–111)
Creatinine: 0.68 mg/dL (ref 0.44–1.00)
GFR, Est AFR Am: >60 mL/min (ref >60)
GFR, Estimated: >60 mL/min (ref >60)
Glucose, Bld: 90 mg/dL (ref 70–99)
Potassium: 3.8 mmol/L (ref 3.5–5.1)
Sodium: 134 mmol/L — ABNORMAL LOW (ref 135–145)
Total Bilirubin: 1.4 mg/dL — ABNORMAL HIGH (ref 0.3–1.2)
Total Protein: 7.2 g/dL (ref 6.5–8.1)

## 2019-11-07 MED ORDER — FLUDEOXYGLUCOSE F - 18 (FDG) INJECTION
5.5000 | Freq: Once | INTRAVENOUS | Status: AC | PRN
  Administered 2019-11-07: 5.5 via INTRAVENOUS

## 2019-11-08 LAB — GLUCOSE, CAPILLARY: Glucose-Capillary: 112 mg/dL — ABNORMAL HIGH (ref 70–99)

## 2019-11-11 ENCOUNTER — Other Ambulatory Visit: Payer: Self-pay

## 2019-11-11 ENCOUNTER — Inpatient Hospital Stay: Payer: Medicare Other | Attending: Oncology | Admitting: Oncology

## 2019-11-11 VITALS — BP 147/93 | HR 97 | Temp 97.0°F | Resp 20 | Ht 65.0 in | Wt 107.3 lb

## 2019-11-11 DIAGNOSIS — C770 Secondary and unspecified malignant neoplasm of lymph nodes of head, face and neck: Secondary | ICD-10-CM | POA: Insufficient documentation

## 2019-11-11 DIAGNOSIS — C139 Malignant neoplasm of hypopharynx, unspecified: Secondary | ICD-10-CM | POA: Diagnosis present

## 2019-11-11 DIAGNOSIS — Z5111 Encounter for antineoplastic chemotherapy: Secondary | ICD-10-CM | POA: Diagnosis not present

## 2019-11-11 NOTE — Progress Notes (Signed)
Head and Neck Cancer Location of Tumor / Histology: Invasive poorly differentiated squamous cell carcinoma of RIGHT hypopharynx  Patient presented with symptoms of: dysphagia and weight loss for about the last 2 months. She has lost close to 30 pounds in the interim.  Biopsies revealed:  10/15/2019 FINAL MICROSCOPIC DIAGNOSIS:  A. SOFT TISSUE, ANTERIOR COMMISURE LESION, BIOPSY:  - Fibroepithelial polyp, inflamed with mild squamous atypia.  - No invasive carcinoma.  B. SOFT TISSUE, POSTERIOR PHARANGEAL WALL MASS , BIOPSY:  - Invasive poorly differentiated squamous cell carcinoma.   Nutrition Status Yes No Comments  Weight changes? [x]  []    Swallowing concerns? [x]  []  She can only tolerate very soft food or foods with gravy/sauce. She can tolerate thin liquids fine. She is supplementing with Boost/Ensure  PEG? [x]  []     Referrals Yes No Comments  Social Work? [x]  []    Dentistry? [x]  []  Seeing Dr. Teena Dunk later today  Swallowing therapy? [x]  []    Nutrition? [x]  []    Med/Onc? [x]  []  Dr. Zola Button (weekly Cisplatin)   Safety Issues Yes No Comments  Prior radiation? []  [x]    Pacemaker/ICD? []  [x]    Possible current pregnancy? []  [x]    Is the patient on methotrexate? []  [x]     Tobacco/Marijuana/Snuff/ETOH use: Current smoker, but has weaned herself down to 1-2 cigarettes a day. She drinks alcohol occasionally  Past/Anticipated interventions by otolaryngology, if any: 10/15/2019 Dr. Izora Gala DIRECT LARYNGOSCOPY w/BIOPSY RIGID ESOPHAGOSCOPY  Past/Anticipated interventions by medical oncology, if any: Under care of Dr. Zola Button 11/11/2019 -She has no evidence of distant metastasis at this time and definitive treatment with radiation and chemotherapy remains her best option. -She is agreeable to proceed and we will arrange for education class prior to proceeding with chemotherapy.  The start date will be dependent on her radiation appointments -IV access:  Port-A-Cath will be inserted in the near future. -Nutrition: Feeding tube will be placed as well.  Nutritional consult will be needed moving forward -Prognosis and goals of care: Her disease remains curable and aggressive measures are warranted given her reasonable performance status. -Follow-up: In the near future for the start of therapy  Current Complaints / other details:   Currently has a lot of secretions and phlegm that she is constantly having to cough/spit up.

## 2019-11-11 NOTE — Progress Notes (Signed)
Hematology and Oncology Follow Up Visit  Tina Kennedy [id number] February 13, 1951 69 y.o. 11/11/2019 9:26 AM Patient, No Pcp Cheri Fowler, MD   Principle Diagnosis: 69 year old woman with squamous cell carcinoma of the right hypopharynx.  She was found to have retropharyngeal tumor with adenopathy indicating at least stage III disease diagnosed in July 2021.   Prior Therapy:  She is status post direct laryngoscopy and biopsy completed on October 15, 2019.  Current therapy: Under evaluation to start definitive therapy.  Interim History: Tina Kennedy returns today for a follow-up visit.  Since the last visit, she reports no major changes in her health.  She does not report any oral pain or discomfort but has reported warm weight loss.  She denies any nausea, vomiting or abdominal pain.  She denies any dysphagia.  Her performance status quality of life remained reasonable.     Medications: I have reviewed the patient's current medications.  Current Outpatient Medications  Medication Sig Dispense Refill  . Aspirin-Salicylamide-Caffeine (BC HEADACHE POWDER PO) Take 1 packet by mouth daily as needed (pain/headaches.).    Marland Kitchen lidocaine-prilocaine (EMLA) cream Apply 1 application topically as needed. 30 g 0  . prochlorperazine (COMPAZINE) 10 MG tablet Take 1 tablet (10 mg total) by mouth every 6 (six) hours as needed for nausea or vomiting. 30 tablet 0   No current facility-administered medications for this visit.     Allergies: No Known Allergies    Physical Exam: Blood pressure (!) 147/93, pulse 97, temperature (!) 97 F (36.1 C), temperature source Temporal, resp. rate 20, height 5\' 5"  (1.651 m), weight 107 lb 4.8 oz (48.7 kg), SpO2 100 %.   ECOG: 1   General appearance: Comfortable appearing without any discomfort Head: Normocephalic without any trauma Oropharynx: Edentulous without any oral ulcers or lesions. Eyes: Pupils are equal and round reactive to light. Lymph nodes: No  cervical, supraclavicular, inguinal or axillary lymphadenopathy.   Heart:regular rate and rhythm.  S1 and S2 without leg edema. Lung: Clear without any rhonchi or wheezes.  No dullness to percussion. Abdomin: Soft, nontender, nondistended with good bowel sounds.  No hepatosplenomegaly. Musculoskeletal: No joint deformity or effusion.  Full range of motion noted. Neurological: No deficits noted on motor, sensory and deep tendon reflex exam. Skin: No petechial rash or dryness.  Appeared moist.      Lab Results: Lab Results  Component Value Date   WBC 5.3 11/07/2019   HGB 16.6 (H) 11/07/2019   HCT 47.4 (H) 11/07/2019   MCV 79.7 (L) 11/07/2019   PLT 369 11/07/2019     Chemistry      Component Value Date/Time   NA 134 (L) 11/07/2019 1015   K 3.8 11/07/2019 1015   CL 97 (L) 11/07/2019 1015   CO2 27 11/07/2019 1015   BUN 6 (L) 11/07/2019 1015   CREATININE 0.68 11/07/2019 1015      Component Value Date/Time   CALCIUM 9.9 11/07/2019 1015   ALKPHOS 121 11/07/2019 1015   AST 25 11/07/2019 1015   ALT 13 11/07/2019 1015   BILITOT 1.4 (H) 11/07/2019 1015     IMPRESSION: 1. 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 2. No evidence of extracervical metastatic disease. 3. Low anal hypermetabolism which could be physiologic or neoplastic. Consider physical exam correlation. 4. A right-sided ground-glass nodule is nonspecific, warranting follow-up attention. 5. Incidental findings, including: Aortic atherosclerosis (ICD10-I70.0), coronary artery atherosclerosis and emphysema (ICD10-J43.9).    Impression and Plan:   69 year old woman with:  1.  Squamous cell carcinoma of the right hypopharyngeal area.  She was found to have at least stage III disease with right retropharyngeal nodal metastasis.  PET CT scan obtained on 11/07/2019 was personally reviewed and discussed with the patient.  She has no evidence of distant metastasis at this time and  definitive treatment with radiation and chemotherapy remains her best option.  She is scheduled to meet with Dr. Isidore Moos this week and she will also have Port-A-Cath as well as PEG tube placement.  Complication associated with chemotherapy were reiterated today.  Nausea, vomiting, myelosuppression and infusion related complications were reviewed.  Renal toxicity and electrolyte imbalance were also discussed associated with cisplatin based therapy.   She is agreeable to proceed and we will arrange for education class prior to proceeding with chemotherapy.  The start date will be dependent on her radiation appointments.  2.  IV access: Port-A-Cath will be inserted in the near future.  EMLA cream is available to her.  3.  Nutrition: Feeding tube will be placed as well.  Nutritional consult will be needed moving forward.   4.  Antiemetics: No nausea or vomiting reported.  Compazine is available to her.  5.  Renal function surveillance: Baseline kidney function is normal we will continue to monitor on platinum therapy.  6.  Prognosis and goals of care: Her disease remains curable and aggressive measures are warranted given her reasonable performance status.  7.  Follow-up: In the near future for the start of therapy.  30  minutes were spent on this encounter.  The time was dedicated to reviewing her disease status, reviewing imaging studies, discussing complications noted therapy and future plan of care review.   Zola Button, MD 8/2/20219:26 AM

## 2019-11-12 ENCOUNTER — Ambulatory Visit (HOSPITAL_COMMUNITY): Payer: Self-pay | Admitting: Dentistry

## 2019-11-12 ENCOUNTER — Other Ambulatory Visit: Payer: Self-pay

## 2019-11-12 ENCOUNTER — Encounter (HOSPITAL_COMMUNITY): Payer: Self-pay | Admitting: Dentistry

## 2019-11-12 ENCOUNTER — Encounter: Payer: Self-pay | Admitting: Radiation Oncology

## 2019-11-12 ENCOUNTER — Ambulatory Visit
Admission: RE | Admit: 2019-11-12 | Discharge: 2019-11-12 | Disposition: A | Discharge status: Home / Self Care | Payer: Medicare Other | Source: Ambulatory Visit | Attending: Radiation Oncology | Admitting: Radiation Oncology

## 2019-11-12 VITALS — BP 142/97 | HR 84 | Temp 99.0°F

## 2019-11-12 VITALS — BP 139/86 | HR 95 | Temp 98.4°F | Resp 20 | Ht 65.0 in | Wt 107.6 lb

## 2019-11-12 DIAGNOSIS — K08109 Complete loss of teeth, unspecified cause, unspecified class: Secondary | ICD-10-CM | POA: Diagnosis not present

## 2019-11-12 DIAGNOSIS — Z01818 Encounter for other preprocedural examination: Secondary | ICD-10-CM

## 2019-11-12 DIAGNOSIS — F17210 Nicotine dependence, cigarettes, uncomplicated: Secondary | ICD-10-CM | POA: Insufficient documentation

## 2019-11-12 DIAGNOSIS — C132 Malignant neoplasm of posterior wall of hypopharynx: Secondary | ICD-10-CM

## 2019-11-12 DIAGNOSIS — K0889 Other specified disorders of teeth and supporting structures: Secondary | ICD-10-CM

## 2019-11-12 DIAGNOSIS — M8580 Other specified disorders of bone density and structure, unspecified site: Secondary | ICD-10-CM | POA: Insufficient documentation

## 2019-11-12 DIAGNOSIS — M278 Other specified diseases of jaws: Secondary | ICD-10-CM

## 2019-11-12 DIAGNOSIS — I700 Atherosclerosis of aorta: Secondary | ICD-10-CM | POA: Diagnosis not present

## 2019-11-12 DIAGNOSIS — K0820 Unspecified atrophy of edentulous alveolar ridge: Secondary | ICD-10-CM

## 2019-11-12 DIAGNOSIS — Z79899 Other long term (current) drug therapy: Secondary | ICD-10-CM | POA: Diagnosis not present

## 2019-11-12 DIAGNOSIS — C139 Malignant neoplasm of hypopharynx, unspecified: Secondary | ICD-10-CM

## 2019-11-12 DIAGNOSIS — J439 Emphysema, unspecified: Secondary | ICD-10-CM | POA: Diagnosis not present

## 2019-11-12 DIAGNOSIS — C138 Malignant neoplasm of overlapping sites of hypopharynx: Secondary | ICD-10-CM | POA: Diagnosis present

## 2019-11-12 DIAGNOSIS — I2510 Atherosclerotic heart disease of native coronary artery without angina pectoris: Secondary | ICD-10-CM | POA: Insufficient documentation

## 2019-11-12 DIAGNOSIS — I6523 Occlusion and stenosis of bilateral carotid arteries: Secondary | ICD-10-CM | POA: Insufficient documentation

## 2019-11-12 DIAGNOSIS — Z972 Presence of dental prosthetic device (complete) (partial): Secondary | ICD-10-CM

## 2019-11-12 DIAGNOSIS — Z98811 Dental restoration status: Secondary | ICD-10-CM | POA: Diagnosis not present

## 2019-11-12 DIAGNOSIS — R911 Solitary pulmonary nodule: Secondary | ICD-10-CM | POA: Insufficient documentation

## 2019-11-12 NOTE — Progress Notes (Signed)
DENTAL CONSULTATION  Date of Consultation:  11/12/2019 Patient Name:   Tina Kennedy Date of Birth:   05/07/1950 Medical Record Number: [id number]  COVID 19 SCREENING: The patient does not symptoms concerning for COVID-19 infection (Including fever, chills, cough, or new SHORTNESS OF BREATH).  Patient has had both doses of the Pfizer coronavirus vaccine.  VITALS: BP (!) 142/97 (BP Location: Right Arm)   Pulse 84   Temp 99 F (37.2 C)   CHIEF COMPLAINT: Patient referred by Dr. Isidore Moos for dental consultation.  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  Patient with anticipated chemoradiation therapy.  Patient is now seen as part of a medically necessary prechemoradiation therapy dental protocol examination.  The patient currently denies acute toothaches, swellings, or abscesses.  Patient indicates that she is edentulous.  Patient had upper and lower complete dentures fabricated 8 to 9 years ago at [place], [place].  Patient indicatesthat the upper denture fits good.  Patient has trouble wearing the lower complete denture.  Patient indicates that she takes the dentures out to eat.  Patient puts the dentures and water when they are out of her mouth.  Patient denies having any active denture irritation.  Patient denies having dental phobia.   Patient Active Problem List   Diagnosis Date Noted  . 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 Goals of care, counseling/discussion 10/31/2019    PMH: History reviewed. No pertinent past medical history.  PSH: Past Surgical History:  Procedure Laterality Date  . DIRECT LARYNGOSCOPY N/A 10/15/2019   Procedure: DIRECT LARYNGOSCOPY w/BIOPSY;  Surgeon: Izora Gala, MD;  Location: [place];  Service: ENT;  Laterality: N/A;  . RIGID ESOPHAGOSCOPY N/A 10/15/2019   Procedure: RIGID ESOPHAGOSCOPY;  Surgeon: Izora Gala, MD;  Location: [facility] [place];  Service: ENT;   Laterality: N/A;    ALLERGIES: No Known Allergies  MEDICATIONS: Current Outpatient Medications  Medication Sig Dispense Refill  . Aspirin-Salicylamide-Caffeine (BC HEADACHE POWDER PO) Take 1 packet by mouth daily as needed (pain/headaches.).    Marland Kitchen lidocaine-prilocaine (EMLA) cream Apply 1 application topically as needed. (Patient not taking: Reported on 11/12/2019) 30 g 0  . prochlorperazine (COMPAZINE) 10 MG tablet Take 1 tablet (10 mg total) by mouth every 6 (six) hours as needed for nausea or vomiting. (Patient not taking: Reported on 11/12/2019) 30 tablet 0   No current facility-administered medications for this visit.    LABS: Lab Results  Component Value Date   WBC 5.3 11/07/2019   HGB 16.6 (H) 11/07/2019   HCT 47.4 (H) 11/07/2019   MCV 79.7 (L) 11/07/2019   PLT 369 11/07/2019      Component Value Date/Time   NA 134 (L) 11/07/2019 1015   K 3.8 11/07/2019 1015   CL 97 (L) 11/07/2019 1015   CO2 27 11/07/2019 1015   GLUCOSE 90 11/07/2019 1015   BUN 6 (L) 11/07/2019 1015   CREATININE 0.68 11/07/2019 1015   CALCIUM 9.9 11/07/2019 1015   GFRNONAA >60 11/07/2019 1015   GFRAA >60 11/07/2019 1015   No results found for: INR, PROTIME No results found for: PTT  SOCIAL HISTORY: Social History   Socioeconomic History  . Marital status: Divorced    Spouse name: Not on file  . Number of children: 1  . Years of education: Not on file  . Highest education level: Not on file  Occupational History  . Not on file  Tobacco Use  . Smoking status:  Current Some Day Smoker    Packs/day: 0.50    Years: 10.00    Pack years: 5.00    Types: Cigarettes  . Smokeless tobacco: Never Used  . Tobacco comment: Currently 1-2 cigarettes daily  Vaping Use  . Vaping Use: Never used  Substance and Sexual Activity  . Alcohol use: Yes    Alcohol/week: 1.0 standard drink    Types: 1 Cans of beer per week    Comment: Occasional on weekend  . Drug use: Yes    Types: Marijuana    Comment:  occasionally  . Sexual activity: Yes  Other Topics Concern  . Not on file  Social History Narrative  . Not on file   Social Determinants of Health   Financial Resource Strain:   . Difficulty of Paying Living Expenses:   Food Insecurity:   . Worried About Charity fundraiser in the Last Year:   . Arboriculturist in the Last Year:   Transportation Needs:   . Film/video editor (Medical):   Marland Kitchen Lack of Transportation (Non-Medical):   Physical Activity:   . Days of Exercise per Week:   . Minutes of Exercise per Session:   Stress:   . Feeling of Stress :   Social Connections:   . Frequency of Communication with Friends and Family:   . Frequency of Social Gatherings with Friends and Family:   . Attends Religious Services:   . Active Member of Clubs or Organizations:   . Attends Archivist Meetings:   Marland Kitchen Marital Status:   Intimate Partner Violence:   . Fear of Current or Ex-Partner:   . Emotionally Abused:   Marland Kitchen Physically Abused:   . Sexually Abused:     FAMILY HISTORY: Family History  Problem Relation Age of Onset  . Hypertension Mother   . Hypertension Sister     REVIEW OF SYSTEMS: Reviewed with the patient as per History of present illness. Psych: Patient denies having dental phobia.  DENTAL HISTORY: CHIEF COMPLAINT: Patient referred by Dr. Isidore Moos for dental consultation.  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  Patient with anticipated chemoradiation therapy.  Patient is now seen as part of a medically necessary prechemoradiation therapy dental protocol examination.  The patient currently denies acute toothaches, swellings, or abscesses.  Patient indicates that she is edentulous.  Patient had upper and lower complete dentures fabricated 8 to 9 years ago at [place], [place].  Patient indicatesthat the upper denture fits good.  Patient has trouble wearing the lower complete  denture.  Patient indicates that she takes the dentures out to eat.  Patient puts the dentures and water when they are out of her mouth.  Patient denies having any active denture irritation.  Patient denies having dental phobia.  DENTAL EXAMINATION: GENERAL: The patient is a well-developed, well-nourished female no acute distress. HEAD AND NECK: There is no palpable neck lymphadenopathy.  The patient denies acute TMJ symptoms. INTRAORAL EXAM: Patient has normal saliva.  The patient has buccal exostoses in the area of 18 through 20 and 28 through 32. DENTITION: Patient is edentulous.  There is atrophy of the edentulous alveolar ridges. PROSTHODONTIC: Patient has upper and lower complete dentures fabricated approximately 8 to 9 years ago at [facility].  These are clinically ill fitting.  The patient is unable to insert the lower denture due to rubbing on the lower left and lower right buccal exostoses. OCCLUSION: I  am unable to assess the occlusion of the upper and lower complete dentures at this time. Occlusion appears to be acceptable while evaluating the dentures outside of the mouth.  RADIOGRAPHIC INTERPRETATION: Orthopantogram was taken today. The patient is edentulous.  There is no evidence of retained root tips or impacted teeth. There are bilateral mandibular radiopacities consistent with the presence of the bilateral mandibular buccal exostoses.   ASSESSMENTS: 1.  Hypopharyngeal cancer 2.  Prechemoradiation therapy dental protocol 3.  Patient is edentulous 4.  There is atrophy of the edentulous alveolar ridges 5.  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  Ill-fitting maxillary and mandibular complete dentures   PLAN/RECOMMENDATIONS: 1. I discussed the risks, benefits, and complications of various treatment options with the patient in relationship to her medical and dental conditions, anticipated chemoradiation therapy, and chemoradiation therapy side effects to include  xerostomia, trismus, mucositis, taste changes, gum and jawbone changes, and risk for infection and osteoradionecrosis.. We discussed various treatment options to include no treatment, pre-prosthetic surgery as indicated, implant therapy, and replacement of missing teeth as indicated. The patient currently wishes to defer any dental treatment or surgery at this time.  Patient will follow up with an oral surgeon for evaluation for bilateral mandibular buccal exostosis reductions and subsequent fabrication of new upper and lower complete dentures as indicated after adequate healing.  Due to the ill fitting nature of the lower complete denture, the patient will keep the denture out and maintain a soft diet at this time along with the use of the anticipated feeding tube.  Discussion with Dr. Isidore Moos concerning anticipated ports and doses.  We also discussed the need for future preprosthetic surgery.  Due to the patient's need to start treatment as soon as possible, no dental surgery was scheduled at this time. Dr. Isidore Moos will keep the dose to the lower right and lower left posterior quadrants below 5000 cGy or lower to allow for the future preprosthetic surgery that will be required in order to allow for successful denture fabrication.   2. Discussion of findings with medical team and coordination of future medical and dental care as needed.  I spent in excess of  90 minutes during the conduct of this consultation and >50% of this time involved direct face-to-face encounter for counseling and/or coordination of the patient's care.    Lenn Cal, DDS

## 2019-11-12 NOTE — Patient Instructions (Signed)

## 2019-11-12 NOTE — Progress Notes (Signed)
Oncology Nurse Navigator Documentation  Met with patient during initial consult with Dr. Isidore Moos.    . Further introduced myself as his Navigator, explained my role as a member of the Care Team.   . Provided introductory explanation of radiation treatment including SIM planning and purpose of Aquaplast head and shoulder mask, showed them example.   . Provided and discussed education handouts for PEG and PAC.  . I encouraged her to contact me with questions/concerns as treatments/procedures begin.  She verbalized understanding of information provided.    I provided her with a calendar of upcoming appointments including IV start, CT simulation tomorrow morning, COVID pre-surgical testing tomorrow afternoon and PEG/PAC placement Friday 8/6.  I plan to provide PEG education after CT simulation tomorrow, and she is aware.  Harlow Asa, RN, BSN, OCN Head & Neck Oncology Nurse [place] at [place] [phone]

## 2019-11-13 ENCOUNTER — Other Ambulatory Visit: Payer: Self-pay

## 2019-11-13 ENCOUNTER — Other Ambulatory Visit: Payer: Self-pay | Admitting: Oncology

## 2019-11-13 ENCOUNTER — Ambulatory Visit
Admission: RE | Admit: 2019-11-13 | Discharge: 2019-11-13 | Disposition: A | Discharge status: Home / Self Care | Payer: Medicare Other | Source: Ambulatory Visit | Attending: Radiation Oncology | Admitting: Radiation Oncology

## 2019-11-13 ENCOUNTER — Other Ambulatory Visit (HOSPITAL_COMMUNITY)
Admission: RE | Admit: 2019-11-13 | Discharge: 2019-11-13 | Disposition: A | Discharge status: Home / Self Care | Payer: Medicare Other | Source: Ambulatory Visit | Attending: Oncology | Admitting: Oncology

## 2019-11-13 VITALS — BP 130/87 | HR 83 | Temp 98.5°F | Resp 19

## 2019-11-13 DIAGNOSIS — C139 Malignant neoplasm of hypopharynx, unspecified: Secondary | ICD-10-CM | POA: Diagnosis present

## 2019-11-13 DIAGNOSIS — Z79899 Other long term (current) drug therapy: Secondary | ICD-10-CM | POA: Diagnosis not present

## 2019-11-13 DIAGNOSIS — Z01812 Encounter for preprocedural laboratory examination: Secondary | ICD-10-CM | POA: Diagnosis present

## 2019-11-13 DIAGNOSIS — C770 Secondary and unspecified malignant neoplasm of lymph nodes of head, face and neck: Secondary | ICD-10-CM | POA: Insufficient documentation

## 2019-11-13 DIAGNOSIS — Z20822 Contact with and (suspected) exposure to covid-19: Secondary | ICD-10-CM | POA: Insufficient documentation

## 2019-11-13 DIAGNOSIS — Z510 Encounter for antineoplastic radiation therapy: Secondary | ICD-10-CM | POA: Diagnosis not present

## 2019-11-13 LAB — SARS CORONAVIRUS 2 (TAT 6-24 HRS): SARS Coronavirus 2: NEGATIVE

## 2019-11-13 MED ORDER — SODIUM CHLORIDE 0.9% FLUSH
10.0000 mL | Freq: Once | INTRAVENOUS | Status: AC
  Administered 2019-11-13: 10 mL via INTRAVENOUS

## 2019-11-13 NOTE — Progress Notes (Signed)
Called over to CT simulation because patient was restless and reporting dizziness/"funny feeling" in her head. On arrival patient did not appear to be in distress, but she did keep vocalizing that she didn't feel well. Vitals taken and stable. After waiting for about 10 minutes patient stated she was willing to try and lay down on the table again for simulation. Blanket was placed under her head, and a support rest under her knees. Patient stated those two supports helped her feel more stable. Dr. Isidore Moos came to machine and examined patient. She listened to patient's lungs and heart, and reported regular rate/rhythm and normal breath sounds. Patient agreed to proceed with simulation with contrast, and per therapists was able to complete procedure without incident. Patient brought back over to nursing for repeat vitals and PEG education from [facility].  Vitals:   11/13/19 0849 11/13/19 0922  BP: (!) 138/91 130/87  Pulse: 92 83  Resp:    Temp:    SpO2: 97% 98%

## 2019-11-13 NOTE — Progress Notes (Signed)
Oncology Nurse Navigator Documentation  Met with Tina Kennedy to provide PEG/port education prior to 11/15/19 PEG placement.  Provided port educational handout, showed example, provided guidance for post-surgical dsg removal, site care.   Using  PEG teaching device   and Teach Back, provided education for PEG use and care, including: hand hygiene, gravity bolus administration of daily water flushes and nutritional supplement, fluids and medications; care of tube insertion site including daily dressing change and cleaning; S&S of infection.    Tina Kennedy correctly verbalized procedures for and provided correct return demonstration of gravity administration of water, dressing change and site care.   I provided written instructions for PEG flushing/dressing change in support of verbal instruction.    I provided/described contents of Start of Care Bolus Feeding Kit (3 60 cc syringes, 2 boxes 4x4 drainage sponges, 1 package mesh briefs, 1 roll paper tape, 1 case Osmolite 1.5).  He voiced understanding he is to start using Osmolite per guidance of Nutrition.  She understands I will be available for ongoing PEG support. Provided barium sulfate prep which I obtained from [facility], reviewed instructions which included guidance for today's COVID screening at [place].   I went over her upcoming schedule with her, and let her know that she would be scheduled to start chemotherapy next week, as well. I will also arrange transportation to and from all her cancer center appointments as requested.  She voiced her understanding and knows to call me if she has any further needs.   Harlow Asa RN, BSN, OCN Head & Neck Oncology Nurse [place] at [facility] Phone # [phone]  Fax # [phone]

## 2019-11-13 NOTE — Progress Notes (Signed)
Has armband been applied?  Yes.    Does patient have an allergy to IV contrast dye?: No.   Has patient ever received premedication for IV contrast dye?: No.   Does patient take metformin?: No.  Date of lab work: November 07, 2019 BUN: 6 CR: 0.68  IV site: forearm left, condition patent and no redness  Has IV site been added to flowsheet?  Yes.    Vitals:   11/13/19 0757  BP: 126/83  Pulse: 96  Resp: 19  Temp: 98.5 F (36.9 C)  SpO2: 97%

## 2019-11-14 ENCOUNTER — Telehealth: Payer: Self-pay | Admitting: *Deleted

## 2019-11-14 ENCOUNTER — Other Ambulatory Visit: Payer: Self-pay | Admitting: Radiology

## 2019-11-14 ENCOUNTER — Inpatient Hospital Stay: Payer: Medicare Other

## 2019-11-14 ENCOUNTER — Other Ambulatory Visit: Payer: Self-pay | Admitting: Student

## 2019-11-14 NOTE — Telephone Encounter (Signed)
Pt did not show for education appt.  Called & reached pt at home.  She had her Covid test done yest & was told to quarantine & states she didn't know about appt.  Offered to do education by phone but she would rather do in person.  Message sent to scheduler to move to next week with radiation.

## 2019-11-15 ENCOUNTER — Other Ambulatory Visit: Payer: Self-pay | Admitting: Oncology

## 2019-11-15 ENCOUNTER — Ambulatory Visit (HOSPITAL_COMMUNITY)
Admission: RE | Admit: 2019-11-15 | Discharge: 2019-11-15 | Disposition: A | Discharge status: Home / Self Care | Payer: Medicare Other | Source: Ambulatory Visit | Attending: Oncology | Admitting: Oncology

## 2019-11-15 ENCOUNTER — Encounter: Payer: Self-pay | Admitting: General Practice

## 2019-11-15 ENCOUNTER — Encounter (HOSPITAL_COMMUNITY): Payer: Self-pay

## 2019-11-15 ENCOUNTER — Other Ambulatory Visit: Payer: Self-pay

## 2019-11-15 DIAGNOSIS — Z510 Encounter for antineoplastic radiation therapy: Secondary | ICD-10-CM | POA: Diagnosis not present

## 2019-11-15 DIAGNOSIS — C139 Malignant neoplasm of hypopharynx, unspecified: Secondary | ICD-10-CM | POA: Diagnosis not present

## 2019-11-15 HISTORY — PX: IR GASTROSTOMY TUBE MOD SED: IMG625

## 2019-11-15 HISTORY — PX: IR IMAGING GUIDED PORT INSERTION: IMG5740

## 2019-11-15 LAB — BASIC METABOLIC PANEL
Anion gap: 11 (ref 5–15)
BUN: 5 mg/dL — ABNORMAL LOW (ref 8–23)
CO2: 25 mmol/L (ref 22–32)
Calcium: 8.9 mg/dL (ref 8.9–10.3)
Chloride: 98 mmol/L (ref 98–111)
Creatinine, Ser: 0.49 mg/dL (ref 0.44–1.00)
GFR calc Af Amer: >60 mL/min (ref >60)
GFR calc non Af Amer: >60 mL/min (ref >60)
Glucose, Bld: 85 mg/dL (ref 70–99)
Potassium: 4 mmol/L (ref 3.5–5.1)
Sodium: 134 mmol/L — ABNORMAL LOW (ref 135–145)

## 2019-11-15 LAB — PROTIME-INR
INR: 1 (ref 0.8–1.2)
Prothrombin Time: 12.6 seconds (ref 11.4–15.2)

## 2019-11-15 LAB — CBC
HCT: 47.2 % — ABNORMAL HIGH (ref 36.0–46.0)
Hemoglobin: 16.4 g/dL — ABNORMAL HIGH (ref 12.0–15.0)
MCH: 29.7 pg (ref 26.0–34.0)
MCHC: 34.7 g/dL (ref 30.0–36.0)
MCV: 85.4 fL (ref 80.0–100.0)
Platelets: 337 10*3/uL (ref 150–400)
RBC: 5.53 MIL/uL — ABNORMAL HIGH (ref 3.87–5.11)
RDW: 14 % (ref 11.5–15.5)
WBC: 4.7 10*3/uL (ref 4.0–10.5)
nRBC: 0 % (ref 0.0–0.2)

## 2019-11-15 MED ORDER — LIDOCAINE-EPINEPHRINE 1 %-1:100000 IJ SOLN
INTRAMUSCULAR | Status: AC | PRN
  Administered 2019-11-15 (×2): 10 mL via INTRADERMAL

## 2019-11-15 MED ORDER — FENTANYL CITRATE (PF) 100 MCG/2ML IJ SOLN
INTRAMUSCULAR | Status: AC | PRN
  Administered 2019-11-15: 50 ug via INTRAVENOUS
  Administered 2019-11-15: 25 ug via INTRAVENOUS
  Administered 2019-11-15: 50 ug via INTRAVENOUS

## 2019-11-15 MED ORDER — HEPARIN SOD (PORK) LOCK FLUSH 100 UNIT/ML IV SOLN
INTRAVENOUS | Status: AC
  Filled 2019-11-15: qty 5

## 2019-11-15 MED ORDER — MIDAZOLAM HCL 2 MG/2ML IJ SOLN
INTRAMUSCULAR | Status: AC | PRN
  Administered 2019-11-15: 0.5 mg via INTRAVENOUS
  Administered 2019-11-15 (×2): 1 mg via INTRAVENOUS

## 2019-11-15 MED ORDER — CEFAZOLIN SODIUM-DEXTROSE 2-4 GM/100ML-% IV SOLN
INTRAVENOUS | Status: AC
  Filled 2019-11-15: qty 100

## 2019-11-15 MED ORDER — LIDOCAINE HCL 1 % IJ SOLN
INTRAMUSCULAR | Status: AC
  Filled 2019-11-15: qty 20

## 2019-11-15 MED ORDER — CEFAZOLIN SODIUM-DEXTROSE 2-4 GM/100ML-% IV SOLN
2.0000 g | INTRAVENOUS | Status: AC
  Administered 2019-11-15: 2 g via INTRAVENOUS

## 2019-11-15 MED ORDER — FENTANYL CITRATE (PF) 100 MCG/2ML IJ SOLN
INTRAMUSCULAR | Status: AC
  Filled 2019-11-15: qty 4

## 2019-11-15 MED ORDER — MIDAZOLAM HCL 2 MG/2ML IJ SOLN
INTRAMUSCULAR | Status: AC
  Filled 2019-11-15: qty 4

## 2019-11-15 MED ORDER — GLUCAGON HCL RDNA (DIAGNOSTIC) 1 MG IJ SOLR
INTRAMUSCULAR | Status: AC
  Filled 2019-11-15: qty 1

## 2019-11-15 MED ORDER — LIDOCAINE-EPINEPHRINE 1 %-1:100000 IJ SOLN
INTRAMUSCULAR | Status: AC
  Filled 2019-11-15: qty 1

## 2019-11-15 MED ORDER — HEPARIN SOD (PORK) LOCK FLUSH 100 UNIT/ML IV SOLN
INTRAVENOUS | Status: AC | PRN
  Administered 2019-11-15: 500 [IU] via INTRAVENOUS

## 2019-11-15 MED ORDER — SODIUM CHLORIDE 0.9 % IV SOLN: INTRAVENOUS | Status: DC

## 2019-11-15 NOTE — Sedation Documentation (Addendum)
Port a cath procedure begun.

## 2019-11-15 NOTE — Consult Note (Signed)
Chief Complaint: Patient was seen in consultation today for Port-A-Cath and percutaneous gastrostomy tube placements  Referring Physician(s): Wyatt Portela  Supervising Physician: Corrie Mckusick  Patient Status: [facility] - Out-pt  History of Present Illness: Tina Kennedy is a 69 y.o. 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   No past medical history on file. Past Surgical History:  Procedure Laterality Date  . DIRECT LARYNGOSCOPY N/A 10/15/2019   Procedure: DIRECT LARYNGOSCOPY w/BIOPSY;  Surgeon: Izora Gala, MD;  Location: [place];  Service: ENT;  Laterality: N/A;  . RIGID ESOPHAGOSCOPY N/A 10/15/2019   Procedure: RIGID ESOPHAGOSCOPY;  Surgeon: Izora Gala, MD;  Location: [place];  Service: ENT;  Laterality: N/A;     Past Surgical History:  Procedure Laterality Date  . DIRECT LARYNGOSCOPY N/A 10/15/2019   Procedure: DIRECT LARYNGOSCOPY w/BIOPSY;  Surgeon: Izora Gala, MD;  Location: [place];  Service: ENT;  Laterality: N/A;  . RIGID ESOPHAGOSCOPY N/A 10/15/2019   Procedure: RIGID ESOPHAGOSCOPY;  Surgeon: Izora Gala, MD;  Location: [place];  Service: ENT;  Laterality: N/A;    Allergies: Patient has no known allergies.  Medications: Prior to Admission medications   Medication Sig Start Date End Date Taking? Authorizing Provider  Aspirin-Salicylamide-Caffeine (BC HEADACHE POWDER PO) Take 1 packet by mouth daily as needed (pain/headaches.).    [provider]  lidocaine-prilocaine (EMLA) cream Apply 1 application topically as needed. Patient not taking: Reported on 11/12/2019 10/31/19   Wyatt Portela, MD  prochlorperazine (COMPAZINE) 10 MG tablet Take 1 tablet (10 mg total) by mouth every 6 (six) hours as needed for nausea or vomiting. Patient not taking: Reported on 11/12/2019 10/31/19   Wyatt Portela, MD      Family History  Problem Relation Age of Onset  . Hypertension Mother   . Hypertension Sister     Social History   Socioeconomic History  . Marital status: Divorced    Spouse name: Not on file  . Number of children: 1  . Years of education: Not on file  . Highest education level: Not on file  Occupational History  . Not on file  Tobacco Use  . Smoking status: Current Some Day Smoker    Packs/day: 0.50    Years: 10.00    Pack years: 5.00    Types: Cigarettes  . Smokeless tobacco: Never Used  . Tobacco comment: Currently 1-2 cigarettes daily  Vaping Use  . Vaping Use: Never used  Substance and Sexual Activity  . Alcohol use: Yes    Alcohol/week: 1.0 standard drink    Types: 1 Cans of beer per week    Comment: Occasional on weekend  . Drug use: Yes    Types: Marijuana    Comment: occasionally  . Sexual activity: Yes  Other Topics Concern  . Not on file  Social History Narrative  . Not on file   Social Determinants of Health   Financial Resource Strain:   . Difficulty of Paying Living Expenses:   Food Insecurity:   . Worried About Charity fundraiser in the Last Year:   . Arboriculturist in the Last Year:   Transportation Needs:   . Film/video editor (Medical):   Marland Kitchen Lack of Transportation (Non-Medical):   Physical Activity:   . Days of Exercise per Week:   . Minutes of Exercise per Session:  Stress:   . Feeling of Stress :   Social Connections:   . Frequency of Communication with Friends and Family:   . Frequency of Social Gatherings with Friends and Family:   . Attends Religious Services:   . Active Member of Clubs or Organizations:   . Attends Archivist Meetings:   Marland Kitchen Marital Status:       Review of Systems denies fever, headache, chest pain, worsening dyspnea, abdominal/back pain, nausea, vomiting or bleeding.  She does have increased oral secretions, and odynophagia  Vital Signs: Blood pressure 143/87, heart rate 89,  respirations 18, temp 98.4, O2 sat 100% room air   Physical Exam awake, alert.  Chest with distant breath sounds bilaterally.  Heart with regular rate and rhythm.  Abdomen soft, positive bowel sounds, nontender.  No lower extremity edema.  Imaging: NM PET Image Initial (PI) Skull Base To Thigh  Result Date: 11/07/2019 CLINICAL DATA:  Initial treatment strategy for staging of hypopharyngeal cancer. EXAM: NUCLEAR MEDICINE PET SKULL BASE TO THIGH TECHNIQUE: 5.5 mCi F-18 FDG was injected intravenously. Full-ring PET imaging was performed from the skull base to thigh after the radiotracer. CT data was obtained and used for attenuation correction and anatomic localization. Fasting blood glucose: 112 mg/dl COMPARISON:  Neck CT 09/13/2019 FINDINGS: Mediastinal blood pool activity: SUV max 2.2 Liver activity: SUV max NA NECK: Right retropharyngeal node with corresponding hypermetabolism. Example 2.3 x 2.1 cm and a S.U.V. max of 17.4 on 19/4. This is relatively similar to on the prior diagnostic CT. Bulky proximal esophageal mass measures a S.U.V. max of 33.0, including on 33/4. Incidental CT findings: Bilateral carotid atherosclerosis. No interval finding since the prior diagnostic CT. CHEST: No pulmonary parenchymal or thoracic nodal hypermetabolism. Incidental CT findings: Aortic atherosclerosis. Lad coronary artery atherosclerosis. Centrilobular and paraseptal emphysema. Vague ground-glass nodularity along the right minor fissure at 11 mm on 70/4. ABDOMEN/PELVIS: No abdominopelvic nodal hypermetabolism. Low anal hypermetabolism measures a S.U.V. max of 11.5. This area suboptimally evaluated at CT secondary to pelvic floor laxity and suggestion of rectal prolapse. Incidental CT findings: Normal adrenal glands. Abdominal aortic atherosclerosis. Large amount of stool in the rectum. SKELETON: No abnormal marrow activity. Incidental CT findings: Mild osteopenia. Nonacute lateral left rib fractures. IMPRESSION: 1.  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 2. No evidence of extracervical metastatic disease. 3. Low anal hypermetabolism which could be physiologic or neoplastic. Consider physical exam correlation. 4. A right-sided ground-glass nodule is nonspecific, warranting follow-up attention. 5. Incidental findings, including: Aortic atherosclerosis (ICD10-I70.0), coronary artery atherosclerosis and emphysema (ICD10-J43.9). Electronically Signed   By: Abigail Miyamoto M.D.   On: 11/07/2019 16:35    Labs:  CBC: Recent Labs    10/09/19 0955 11/07/19 1015  WBC 6.1 5.3  HGB 17.0* 16.6*  HCT 49.6* 47.4*  PLT 397 369    COAGS: No results for input(s): INR, APTT in the last 8760 hours.  BMP: Recent Labs    11/07/19 1015  NA 134*  K 3.8  CL 97*  CO2 27  GLUCOSE 90  BUN 6*  CALCIUM 9.9  CREATININE 0.68  GFRNONAA >60  GFRAA >60    LIVER FUNCTION TESTS: Recent Labs    11/07/19 1015  BILITOT 1.4*  AST 25  ALT 13  ALKPHOS 121  PROT 7.2  ALBUMIN 2.8*    TUMOR MARKERS: No results for input(s): AFPTM, CEA, CA199, CHROMGRNA in the last 8760 hours.  Assessment and Plan: 69 y.o. 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  Details/risks of procedures, including but not limited to, internal bleeding, infection, injury to adjacent structures discussed with patient with her understanding and consent.   Thank you for this interesting consult.  I greatly enjoyed meeting Tina Kennedy and look forward to participating in their care.  A copy of this report was sent to the requesting provider on this date.  Electronically Signed: D. Rowe Robert, PA-C 11/15/2019, 8:25 AM   I spent a total of 25 minutes    in face to face in clinical consultation, greater than 50% of which was  counseling/coordinating care for Port-A-Cath and gastrostomy tube placements

## 2019-11-15 NOTE — Sedation Documentation (Signed)
Port a cath placement finished. Patient prepped for Gastrostomy tube placement.

## 2019-11-15 NOTE — Procedures (Signed)
Interventional Radiology Procedure Note  Procedure: Placement of a right IJ approach single lumen PowerPort.  Tip is positioned at the superior cavoatrial junction and catheter is ready for immediate use.  Complications: None Recommendations:  - Ok to shower tomorrow - Do not submerge for 7 days - Routine line care   Signed,  Idaliz Tinkle S. Laykin Rainone, DO   

## 2019-11-15 NOTE — Discharge Instructions (Addendum)

## 2019-11-15 NOTE — Procedures (Signed)
Interventional Radiology Procedure Note  Procedure: Attempt for placement of percutaneous gtube.   Unable to pass OG for initiation, secondary to tumor burden.   Complications: None Recommendations: - Port complete - 1 hr dc home - advise either GI or surgical referral  Signed,   Dulcy Fanny. Earleen Newport, DO

## 2019-11-15 NOTE — Progress Notes (Signed)
[place] CSW Progress Notes  Call to patient to complete social work assessment, left VM w encouragement to return call when she is able.  Edwyna Shell, LCSW Clinical Social Worker Phone:  [phone]

## 2019-11-15 NOTE — Sedation Documentation (Signed)
Gastrostomy tube placement procedure discontinued due to Dr Earleen Newport unable to pass OG tube despite multiple attempts.

## 2019-11-16 ENCOUNTER — Encounter: Payer: Self-pay | Admitting: Radiation Oncology

## 2019-11-16 DIAGNOSIS — C132 Malignant neoplasm of posterior wall of hypopharynx: Secondary | ICD-10-CM | POA: Insufficient documentation

## 2019-11-16 NOTE — Progress Notes (Signed)
Radiation Oncology         [phone]) [phone] ________________________________  Initial outpatient Consultation  Name: Tina Kennedy MRN: [id number]  Date: 11/12/2019  DOB: Jan 08, 1951  ZT:[id number], No Pcp Per  Izora Gala, MD   REFERRING PHYSICIAN: Izora Gala, MD  DIAGNOSIS: C13.2   ICD-10-CM   1. 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 Hypopharyngeal cancer ([place])  C13.9   3. 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 female who presented with dysphagia and weight loss for about the last 2 months. She has lost close to 30 pounds in the interim.  She saw Dr. Constance Holster who performed laryngoscopy.  Per his note, this revealed "The posterior soft palate, uvula, tongue base and vallecula were visualized and appeared healthy without mucosal masses or lesions. The epiglottis appears normal. There is pooling of secretions throughout the larynx. There is a fullness of the arytenoids and post cricoid area into the esophageal introitus where there appears to be a large mass. I can visualize the cords but I cannot see well enough to assess the mobility. There also appears to be a growth either contiguous or separate within the glottis."  Biopsies revealed:  10/15/2019 FINAL MICROSCOPIC DIAGNOSIS:  A. SOFT TISSUE, ANTERIOR COMMISURE LESION, BIOPSY:  - Fibroepithelial polyp, inflamed with mild squamous atypia.  - No invasive carcinoma.  B. SOFT TISSUE, POSTERIOR PHARANGEAL WALL MASS , BIOPSY:  - Invasive poorly differentiated squamous cell carcinoma.   CT neck results:1. Bulky enlargement and heterogeneity of the cervical esophagus most compatible  with Esophageal Carcinoma. Associated large, malignant appearing right retropharyngeal lymph node (4 cm long axis). But no other nodal metastases is identified in the neck or upper chest.  2. Several indeterminate bone lucencies at the skull base and in the cervical spine, but no destructive osseous metastasis is identified.  3. Partially visible right lung nodule/ground-glass.   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 No distant metastases.   Nutrition Status Yes No Comments  Weight changes? [x]  []    Swallowing concerns? [x]  []  She can only tolerate very soft food or foods with gravy/sauce. She can tolerate thin liquids fine. She is supplementing with Boost/Ensure  PEG? [x]  []     Referrals Yes No Comments  Social Work? [x]  []    Dentistry? [x]  []  Seeing Dr. Teena Dunk later today  Swallowing therapy? [x]  []    Nutrition? [x]  []    Med/Onc? [x]  []  Dr. Zola Button (weekly Cisplatin)   Safety Issues Yes No Comments  Prior radiation? []  [x]    Pacemaker/ICD? []  [x]    Possible current pregnancy? []  [x]    Is the patient on methotrexate? []  [x]     Tobacco/Marijuana/Snuff/ETOH use: Current smoker, but has weaned herself down to 1-2 cigarettes a day. She drinks alcohol occasionally  Past/Anticipated interventions by otolaryngology, if any: 10/15/2019 Dr. Izora Gala DIRECT LARYNGOSCOPY w/BIOPSY RIGID ESOPHAGOSCOPY  Past/Anticipated interventions by medical oncology, if any: Under care of Dr. Zola Button 11/11/2019 -She has no evidence of distant metastasis at this time and definitive treatment with radiation and chemotherapy remains her best option. -She is agreeable to proceed and we will arrange for education class prior to proceeding with  chemotherapy.  The start date will be dependent on her radiation appointments -IV access: Port-A-Cath will be inserted in the near future. -Nutrition: Feeding tube will be placed as  well.  Nutritional consult will be needed moving forward -Prognosis and goals of care: Her disease remains curable and aggressive measures are warranted given her reasonable performance status. -Follow-up: In the near future for the start of therapy  Current Complaints / other details:   Currently has a lot of secretions and phlegm that she is constantly having to cough/spit up.   I have personally reviewed her imaging.  PREVIOUS RADIATION THERAPY: No  PAST MEDICAL HISTORY:  has no past medical history on file.    PAST SURGICAL HISTORY: Past Surgical History:  Procedure Laterality Date  . DIRECT LARYNGOSCOPY N/A 10/15/2019   Procedure: DIRECT LARYNGOSCOPY w/BIOPSY;  Surgeon: Izora Gala, MD;  Location: [place] [facility];  Service: ENT;  Laterality: N/A;  . IR GASTROSTOMY TUBE MOD SED  11/15/2019  . IR IMAGING GUIDED PORT INSERTION  11/15/2019  . RIGID ESOPHAGOSCOPY N/A 10/15/2019   Procedure: RIGID ESOPHAGOSCOPY;  Surgeon: Izora Gala, MD;  Location: [facility] OR;  Service: ENT;  Laterality: N/A;    FAMILY HISTORY: family history includes Hypertension in her mother and sister.  SOCIAL HISTORY:  reports that she has been smoking cigarettes. She has a 5.00 pack-year smoking history. She has never used smokeless tobacco. She reports current alcohol use of about 1.0 standard drink of alcohol per week. She reports current drug use. Drug: Marijuana.  ALLERGIES: Patient has no known allergies.  MEDICATIONS:  Current Outpatient Medications  Medication Sig Dispense Refill  . Aspirin-Salicylamide-Caffeine (BC HEADACHE POWDER PO) Take 1 packet by mouth daily as needed (pain/headaches.).    Marland Kitchen lidocaine-prilocaine (EMLA) cream Apply 1 application topically as needed. (Patient not taking: Reported on 11/12/2019) 30 g 0  . prochlorperazine (COMPAZINE) 10 MG tablet Take 1 tablet (10 mg total) by mouth every 6 (six) hours as needed for nausea or vomiting. (Patient not taking: Reported on 11/12/2019) 30 tablet 0   No current  facility-administered medications for this encounter.    REVIEW OF SYSTEMS:  Notable for that above.   PHYSICAL EXAM:  height is 5\' 5"  (1.651 m) and weight is 107 lb 9.6 oz (48.8 kg). Her temperature is 98.4 F (36.9 C). Her blood pressure is 139/86 and her pulse is 95. Her respiration is 20 and oxygen saturation is 98%.   General: Alert and oriented, in no acute distress; thin, audible stridor, but breathing without obvious difficulty HEENT: Head is normocephalic. Extraocular movements are intact. Oropharynx is notable for swelling on right at site of Rt retropharyngeal mass. No other lesions. Neck: Neck is notable for no masses palpated Extremities: No cyanosis or edema. Lymphatics: see Neck Exam Skin: No concerning lesions. Psychiatric: Judgment and insight are intact. Affect is appropriate.   ECOG = 2  0 - Asymptomatic (Fully active, able to carry on all predisease activities without restriction)  1 - Symptomatic but completely ambulatory (Restricted in physically strenuous activity but ambulatory and able to carry out work of a light or sedentary nature. For example, light housework, office work)  2 - Symptomatic, <50% in bed during the day (Ambulatory and capable of all self care but unable to carry out any work activities. Up and about more than 50% of waking hours)  3 - Symptomatic, >50% in bed, but not bedbound (Capable of only limited self-care, confined to bed or chair 50% or more of waking  hours)  4 - Bedbound (Completely disabled. Cannot carry on any self-care. Totally confined to bed or chair)  5 - Death   Eustace Pen MM, Creech RH, Tormey DC, et al. [phone]). "Toxicity and response criteria of the [facility]". [place] Oncol. 5 (6): 649-55   LABORATORY DATA:  Lab Results  Component Value Date   WBC 4.7 11/15/2019   HGB 16.4 (H) 11/15/2019   HCT 47.2 (H) 11/15/2019   MCV 85.4 11/15/2019   PLT 337 11/15/2019   CMP     Component Value  Date/Time   NA 134 (L) 11/15/2019 0825   K 4.0 11/15/2019 0825   CL 98 11/15/2019 0825   CO2 25 11/15/2019 0825   GLUCOSE 85 11/15/2019 0825   BUN 5 (L) 11/15/2019 0825   CREATININE 0.49 11/15/2019 0825   CREATININE 0.68 11/07/2019 1015   CALCIUM 8.9 11/15/2019 0825   PROT 7.2 11/07/2019 1015   ALBUMIN 2.8 (L) 11/07/2019 1015   AST 25 11/07/2019 1015   ALT 13 11/07/2019 1015   ALKPHOS 121 11/07/2019 1015   BILITOT 1.4 (H) 11/07/2019 1015   GFRNONAA >60 11/15/2019 0825   GFRNONAA >60 11/07/2019 1015   GFRAA >60 11/15/2019 0825   GFRAA >60 11/07/2019 1015      No results found for: TSH   RADIOGRAPHY: IR GASTROSTOMY TUBE MOD SED  Result Date: 11/15/2019 INDICATION: 69 year old female with pharyngeal cancer referred for gastrostomy tube EXAM: PERC PLACEMENT GASTROSTOMY MEDICATIONS: 2 g Ancef; Antibiotics were administered within 1 hour of the procedure. ANESTHESIA/SEDATION: Versed 0 mg IV; Fentanyl 25 mcg IV Moderate Sedation Time:  0 The patient was continuously monitored during the procedure by the interventional radiology nurse under my direct supervision. CONTRAST:  None FLUOROSCOPY TIME:  Fluoroscopy Time: 3 minutes 54 seconds (15 mGy). COMPLICATIONS: None PROCEDURE: Informed written consent was obtained from the patient and the patient's family after a thorough discussion of the procedural risks, benefits and alternatives. All questions were addressed. Maximal Sterile Barrier Technique was utilized including caps, mask, sterile gowns, sterile gloves, sterile drape, hand hygiene and skin antiseptic. A timeout was performed prior to the initiation of the procedure. The epigastrium was prepped with Betadine in a sterile fashion, and a sterile drape was applied covering the operative field. A sterile gown and sterile gloves were used for the procedure. A 5-French orogastric tube was unable to be placed, given tumor burden and challenges with patient comfort. We withdrew from the procedure.  Patient tolerated the procedure well and remained hemodynamically stable throughout. No complications were encountered and no blood loss encountered. IMPRESSION: Attempt at image guided percutaneous gastrostomy was unsuccessful, as we could not place orogastric tube secondary to patient comfort and tumor burden. Patient potentially remains a candidate for a percutaneous gastrostomy with either GI or surgical technique. Signed, Dulcy Fanny. Earleen Newport, DO [facility] Radiology Electronically Signed   By: Corrie Mckusick D.O.   On: 11/15/2019 11:52   NM PET Image Initial (PI) Skull Base To Thigh  Result Date: 11/07/2019 CLINICAL DATA:  Initial treatment strategy for staging of hypopharyngeal cancer. EXAM: NUCLEAR MEDICINE PET SKULL BASE TO THIGH TECHNIQUE: 5.5 mCi F-18 FDG was injected intravenously. Full-ring PET imaging was performed from the skull base to thigh after the radiotracer. CT data was obtained and used for attenuation correction and anatomic localization. Fasting blood glucose: 112 mg/dl COMPARISON:  Neck CT 09/13/2019 FINDINGS: Mediastinal blood pool activity: SUV max 2.2 Liver activity: SUV max NA  NECK: Right retropharyngeal node with corresponding hypermetabolism. Example 2.3 x 2.1 cm and a S.U.V. max of 17.4 on 19/4. This is relatively similar to on the prior diagnostic CT. Bulky proximal esophageal mass measures a S.U.V. max of 33.0, including on 33/4. Incidental CT findings: Bilateral carotid atherosclerosis. No interval finding since the prior diagnostic CT. CHEST: No pulmonary parenchymal or thoracic nodal hypermetabolism. Incidental CT findings: Aortic atherosclerosis. Lad coronary artery atherosclerosis. Centrilobular and paraseptal emphysema. Vague ground-glass nodularity along the right minor fissure at 11 mm on 70/4. ABDOMEN/PELVIS: No abdominopelvic nodal hypermetabolism. Low anal hypermetabolism measures a S.U.V. max of 11.5. This area  suboptimally evaluated at CT secondary to pelvic floor laxity and suggestion of rectal prolapse. Incidental CT findings: Normal adrenal glands. Abdominal aortic atherosclerosis. Large amount of stool in the rectum. SKELETON: No abnormal marrow activity. Incidental CT findings: Mild osteopenia. Nonacute lateral left rib fractures. IMPRESSION: 1. 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 2. No evidence of extracervical metastatic disease. 3. Low anal hypermetabolism which could be physiologic or neoplastic. Consider physical exam correlation. 4. A right-sided ground-glass nodule is nonspecific, warranting follow-up attention. 5. Incidental findings, including: Aortic atherosclerosis (ICD10-I70.0), coronary artery atherosclerosis and emphysema (ICD10-J43.9). Electronically Signed   By: Abigail Miyamoto M.D.   On: 11/07/2019 16:35   IR IMAGING GUIDED PORT INSERTION  Result Date: 11/15/2019 INDICATION: 69 year old female with pharyngeal cancer referred for port catheter EXAM: IMAGE GUIDED PORT CATHETER PLACEMENT MEDICATIONS: 2 g Ancef; The antibiotic was administered within an appropriate time interval prior to skin puncture. ANESTHESIA/SEDATION: Moderate (conscious) sedation was employed during this procedure. A total of Versed 2.5 mg and Fentanyl 125 mcg was administered intravenously. Moderate Sedation Time: 45 minutes. The patient's level of consciousness and vital signs were monitored continuously by radiology nursing throughout the procedure under my direct supervision. FLUOROSCOPY TIME:  Fluoroscopy Time: 0 minutes 18 seconds (1 mGy). COMPLICATIONS: None PROCEDURE: The procedure, risks, benefits, and alternatives were explained to the patient. Questions regarding the procedure were encouraged and answered. The patient understands and consents to the procedure. Ultrasound survey was performed with images stored and sent to PACs. The right neck and chest was prepped with chlorhexidine,  and draped in the usual sterile fashion using maximum barrier technique (cap and mask, sterile gown, sterile gloves, large sterile sheet, hand hygiene and cutaneous antiseptic). Antibiotic prophylaxis was provided with 2.0g Ancef administered IV one hour prior to skin incision. Local anesthesia was attained by infiltration with 1% lidocaine without epinephrine. Ultrasound demonstrated patency of the right internal jugular vein, and this was documented with an image. Under real-time ultrasound guidance, this vein was accessed with a 21 gauge micropuncture needle and image documentation was performed. A small dermatotomy was made at the access site with an 11 scalpel. A 0.018" wire was advanced into the SVC and used to estimate the length of the internal catheter. The access needle exchanged for a 44F micropuncture vascular sheath. The 0.018" wire was then removed and a 0.035" wire advanced into the IVC. An appropriate location for the subcutaneous reservoir was selected below the clavicle and an incision was made through the skin and underlying soft tissues. The subcutaneous tissues were then dissected using a combination of blunt and sharp surgical technique and a pocket was formed. A single lumen power injectable portacatheter was then tunneled through the subcutaneous tissues from the pocket to the dermatotomy and the port reservoir placed within the subcutaneous pocket. The venous access site was then serially dilated and a peel  away vascular sheath placed over the wire. The wire was removed and the port catheter advanced into position under fluoroscopic guidance. The catheter tip is positioned in the cavoatrial junction. This was documented with a spot image. The portacatheter was then tested and found to flush and aspirate well. The port was flushed with saline followed by 100 units/mL heparinized saline. The pocket was then closed in two layers using first subdermal inverted interrupted absorbable sutures  followed by a running subcuticular suture. The epidermis was then sealed with Dermabond. The dermatotomy at the venous access site was also seal with Dermabond. Patient tolerated the procedure well and remained hemodynamically stable throughout. No complications encountered and no significant blood loss encountered IMPRESSION: Status post right IJ port catheter placement. Signed, Dulcy Fanny. Dellia Nims, RPVI [facility] Radiology Electronically Signed   By: Corrie Mckusick D.O.   On: 11/15/2019 11:50      IMPRESSION/PLAN:  This is a delightful patient with advanced head and neck cancer. I recommend radiotherapy for this patient. Anticipate concurrent systemic therapy.  We discussed the potential risks, benefits, and side effects of radiotherapy. We talked in detail about acute and late effects. We discussed that some of the most bothersome acute effects may be mucositis, dysgeusia, salivary changes, skin irritation, hair loss, dehydration, weight loss and fatigue. We talked about late effects which include but are not necessarily limited to dysphagia, hypothyroidism, vessel or nerve injury, spinal cord injury, xerostomia, trismus, and neck edema. No guarantees of treatment were given. A consent form was signed and placed in the patient's medical record. The patient is enthusiastic about proceeding with treatment. I look forward to participating in the patient's care.    Simulation (treatment planning) will take place very soon, given the advanced nature of her disease, stridor, disease aggressiveness, and urgency of treatment.  We also discussed that the treatment of head and neck cancer is a multidisciplinary process to maximize treatment outcomes and quality of life. For this reason the following referrals have been or will be made:   Medical oncology to discuss chemotherapy    Dentistry for dental evaluation,though I don't think we have time for  extractions given the urgency of treatment.   Nutritionist for nutrition support during and after treatment.   Speech language pathology for swallowing and/or speech therapy.   Social work for social support.    Physical therapy due to risk of lymphedema in neck and deconditioning.  Anticipate PAC and PEG placement in IR.   Baseline labs including TSH.   I asked the patient today about tobacco use. The patient uses tobacco.  I advised the patient to quit. Services were offered by me today including outpatient counseling and pharmacotherapy. I assessed for the willingness to attempt to quit and provided encouragement and demonstrated willingness to make referrals and/or prescriptions to help the patient attempt to quit. The patient has follow-up with the oncologic team to touch base on their tobacco use and /or cessation efforts.  Over 3 minutes were spent on this issue. She plans to quit on her own in 3 days (Nov 15 2019).  She is also working on cutting back on her ETOH use.  On date of service, in total, I spent 60 minutes on this encounter. Patient was seen in person.  __________________________________________   Eppie Gibson, MD

## 2019-11-18 ENCOUNTER — Telehealth: Payer: Self-pay | Admitting: *Deleted

## 2019-11-18 ENCOUNTER — Other Ambulatory Visit: Payer: Self-pay

## 2019-11-18 DIAGNOSIS — C132 Malignant neoplasm of posterior wall of hypopharynx: Secondary | ICD-10-CM

## 2019-11-18 DIAGNOSIS — R634 Abnormal weight loss: Secondary | ICD-10-CM

## 2019-11-18 NOTE — Progress Notes (Signed)
Pharmacist Chemotherapy Monitoring - Initial Assessment    Anticipated start date: 11/22/2019   Regimen:   Are orders appropriate based on the patients diagnosis, regimen, and cycle? Yes  Does the plan date match the patients scheduled date? Yes  Is the sequencing of drugs appropriate? Yes  Are the premedications appropriate for the patients regimen? Yes  Prior Authorization for treatment is: Approved o If applicable, is the correct biosimilar selected based on the patient's insurance? not applicable  Organ Function and Labs:  Are dose adjustments needed based on the patient's renal function, hepatic function, or hematologic function? Yes  Are appropriate labs ordered prior to the start of patient's treatment? Yes  Other organ system assessment, if indicated: cisplatin: baseline audiogram reaching out to MD.   The following baseline labs, if indicated, have been ordered: N/A  Dose Assessment:  Are the drug doses appropriate? Yes  Are the following correct: o Drug concentrations Yes o IV fluid compatible with drug Yes o Administration routes Yes o Timing of therapy Yes  If applicable, does the patient have documented access for treatment and/or plans for port-a-cath placement? yes  If applicable, have lifetime cumulative doses been properly documented and assessed? no Lifetime Dose Tracking  No doses have been documented on this patient for the following tracked chemicals: Doxorubicin, Epirubicin, Idarubicin, Daunorubicin, Mitoxantrone, Bleomycin, Oxaliplatin, Carboplatin, Liposomal Doxorubicin  o   Toxicity Monitoring/Prevention:  The patient has the following take home antiemetics prescribed: Prochlorperazine  The patient has the following take home medications prescribed: N/A  Medication allergies and previous infusion related reactions, if applicable, have been reviewed and addressed. Yes  The patient's current medication list has been assessed for drug-drug  interactions with their chemotherapy regimen. no significant drug-drug interactions were identified on review.  Order Review:  Are the treatment plan orders signed? Yes  Is the patient scheduled to see a provider prior to their treatment? No  I verify that I have reviewed each item in the above checklist and answered each question accordingly.  Arabel Barcenas D 11/18/2019 3:35 PM

## 2019-11-18 NOTE — Telephone Encounter (Signed)
CALLED PATIENT TO INFORM OF LAB APPT. FOR 11-20-19 @ 10 AM, SPOKE WITH PATIENT AND SHE IS AWARE OF THIS APPT.

## 2019-11-19 ENCOUNTER — Inpatient Hospital Stay: Payer: Medicare Other

## 2019-11-19 NOTE — Progress Notes (Signed)
Nutrition  Received message from Anderson Malta, [place] that patient wanted to cancel nutrition appointment for today due to feeling overwhelmed.  RN sent message to scheduler to reschedule nutrition appointment.   Khayla Koppenhaver B. Zenia Resides, [place], [place] Registered Dietitian [phone] (mobile)

## 2019-11-20 ENCOUNTER — Ambulatory Visit
Admission: RE | Admit: 2019-11-20 | Discharge: 2019-11-20 | Disposition: A | Discharge status: Home / Self Care | Payer: Medicare Other | Source: Ambulatory Visit | Attending: Radiation Oncology | Admitting: Radiation Oncology

## 2019-11-20 ENCOUNTER — Inpatient Hospital Stay: Payer: Medicare Other

## 2019-11-20 ENCOUNTER — Other Ambulatory Visit: Payer: Self-pay

## 2019-11-20 DIAGNOSIS — Z510 Encounter for antineoplastic radiation therapy: Secondary | ICD-10-CM | POA: Diagnosis not present

## 2019-11-20 DIAGNOSIS — R634 Abnormal weight loss: Secondary | ICD-10-CM

## 2019-11-20 LAB — TSH: TSH: 0.658 u[IU]/mL (ref 0.308–3.960)

## 2019-11-20 NOTE — Progress Notes (Signed)
Oncology Nurse Navigator Documentation  To provide support, encouragement and care continuity, met with Ms. Tina Kennedy for her initial RT.    I reviewed the 2-step treatment process, answered questions.   Tina Kennedy completed treatment without difficulty, denied questions/concerns.  I reviewed the registration/arrival procedure for subsequent treatments.  I provided her with a new updated scheduled and went through all her appointments with her that are upcoming.   I then assisted her back upstairs to registration for her chemotherapy education appointment scheduled today.   I assured her that I would help to arrange transportation to all cancer center appointments.  I encouraged her to call me with questions/concerns as chemotherapy and radiation  Proceeds  She is aware that she will be receiving a phone call from scheduling to consult with a general surgeon to have her feeding tube placed.   Harlow Asa RN, BSN, OCN Head & Neck Oncology Nurse [place] at [facility] Phone # [phone]  Fax # [phone]

## 2019-11-21 ENCOUNTER — Ambulatory Visit
Admission: RE | Admit: 2019-11-21 | Discharge: 2019-11-21 | Disposition: A | Discharge status: Home / Self Care | Payer: Medicare Other | Source: Ambulatory Visit | Attending: Radiation Oncology | Admitting: Radiation Oncology

## 2019-11-21 ENCOUNTER — Other Ambulatory Visit: Payer: Self-pay

## 2019-11-21 DIAGNOSIS — Z510 Encounter for antineoplastic radiation therapy: Secondary | ICD-10-CM | POA: Diagnosis not present

## 2019-11-22 ENCOUNTER — Encounter: Payer: Self-pay | Admitting: Oncology

## 2019-11-22 ENCOUNTER — Inpatient Hospital Stay: Payer: Medicare Other

## 2019-11-22 ENCOUNTER — Telehealth: Payer: Self-pay | Admitting: *Deleted

## 2019-11-22 ENCOUNTER — Inpatient Hospital Stay: Payer: Medicare Other | Admitting: General Practice

## 2019-11-22 ENCOUNTER — Other Ambulatory Visit: Payer: Self-pay

## 2019-11-22 ENCOUNTER — Ambulatory Visit
Admission: RE | Admit: 2019-11-22 | Discharge: 2019-11-22 | Disposition: A | Discharge status: Home / Self Care | Payer: Medicare Other | Source: Ambulatory Visit | Attending: Radiation Oncology | Admitting: Radiation Oncology

## 2019-11-22 VITALS — BP 136/80 | HR 99 | Temp 98.6°F | Resp 18

## 2019-11-22 DIAGNOSIS — C139 Malignant neoplasm of hypopharynx, unspecified: Secondary | ICD-10-CM

## 2019-11-22 DIAGNOSIS — Z510 Encounter for antineoplastic radiation therapy: Secondary | ICD-10-CM | POA: Diagnosis not present

## 2019-11-22 DIAGNOSIS — C132 Malignant neoplasm of posterior wall of hypopharynx: Secondary | ICD-10-CM

## 2019-11-22 DIAGNOSIS — Z5111 Encounter for antineoplastic chemotherapy: Secondary | ICD-10-CM | POA: Diagnosis not present

## 2019-11-22 DIAGNOSIS — Z95828 Presence of other vascular implants and grafts: Secondary | ICD-10-CM

## 2019-11-22 LAB — CBC WITH DIFFERENTIAL (CANCER CENTER ONLY)
Abs Immature Granulocytes: 0.02 10*3/uL (ref 0.00–0.07)
Basophils Absolute: 0.1 10*3/uL (ref 0.0–0.1)
Basophils Relative: 1 %
Eosinophils Absolute: 0.1 10*3/uL (ref 0.0–0.5)
Eosinophils Relative: 1 %
HCT: 44.7 % (ref 36.0–46.0)
Hemoglobin: 15.9 g/dL — ABNORMAL HIGH (ref 12.0–15.0)
Immature Granulocytes: 0 %
Lymphocytes Relative: 15 %
Lymphs Abs: 1 10*3/uL (ref 0.7–4.0)
MCH: 29 pg (ref 26.0–34.0)
MCHC: 35.6 g/dL (ref 30.0–36.0)
MCV: 81.6 fL (ref 80.0–100.0)
Monocytes Absolute: 0.7 10*3/uL (ref 0.1–1.0)
Monocytes Relative: 11 %
Neutro Abs: 4.7 10*3/uL (ref 1.7–7.7)
Neutrophils Relative %: 72 %
Platelet Count: 364 10*3/uL (ref 150–400)
RBC: 5.48 MIL/uL — ABNORMAL HIGH (ref 3.87–5.11)
RDW: 13.3 % (ref 11.5–15.5)
WBC Count: 6.5 10*3/uL (ref 4.0–10.5)
nRBC: 0 % (ref 0.0–0.2)

## 2019-11-22 LAB — CMP (CANCER CENTER ONLY)
ALT: 8 U/L (ref 0–44)
AST: 20 U/L (ref 15–41)
Albumin: 2.6 g/dL — ABNORMAL LOW (ref 3.5–5.0)
Alkaline Phosphatase: 106 U/L (ref 38–126)
Anion gap: 8 (ref 5–15)
BUN: 5 mg/dL — ABNORMAL LOW (ref 8–23)
CO2: 27 mmol/L (ref 22–32)
Calcium: 9.6 mg/dL (ref 8.9–10.3)
Chloride: 98 mmol/L (ref 98–111)
Creatinine: 0.68 mg/dL (ref 0.44–1.00)
GFR, Est AFR Am: >60 mL/min (ref >60)
GFR, Estimated: >60 mL/min (ref >60)
Glucose, Bld: 181 mg/dL — ABNORMAL HIGH (ref 70–99)
Potassium: 3.7 mmol/L (ref 3.5–5.1)
Sodium: 133 mmol/L — ABNORMAL LOW (ref 135–145)
Total Bilirubin: 1 mg/dL (ref 0.3–1.2)
Total Protein: 6.8 g/dL (ref 6.5–8.1)

## 2019-11-22 MED ORDER — SODIUM CHLORIDE 0.9 % IV SOLN
Freq: Once | INTRAVENOUS | Status: AC
  Filled 2019-11-22: qty 250

## 2019-11-22 MED ORDER — PALONOSETRON HCL INJECTION 0.25 MG/5ML
INTRAVENOUS | Status: AC
  Filled 2019-11-22: qty 5

## 2019-11-22 MED ORDER — POTASSIUM CHLORIDE 2 MEQ/ML IV SOLN
Freq: Once | INTRAVENOUS | Status: AC
  Filled 2019-11-22: qty 10

## 2019-11-22 MED ORDER — SODIUM CHLORIDE 0.9 % IV SOLN
10.0000 mg | Freq: Once | INTRAVENOUS | Status: AC
  Administered 2019-11-22: 10 mg via INTRAVENOUS
  Filled 2019-11-22: qty 10

## 2019-11-22 MED ORDER — PALONOSETRON HCL INJECTION 0.25 MG/5ML
0.2500 mg | Freq: Once | INTRAVENOUS | Status: AC
  Administered 2019-11-22: 0.25 mg via INTRAVENOUS

## 2019-11-22 MED ORDER — SODIUM CHLORIDE 0.9 % IV SOLN
150.0000 mg | Freq: Once | INTRAVENOUS | Status: AC
  Administered 2019-11-22: 150 mg via INTRAVENOUS
  Filled 2019-11-22: qty 150

## 2019-11-22 MED ORDER — HEPARIN SOD (PORK) LOCK FLUSH 100 UNIT/ML IV SOLN
500.0000 [IU] | Freq: Once | INTRAVENOUS | Status: AC | PRN
  Administered 2019-11-22: 500 [IU]
  Filled 2019-11-22: qty 5

## 2019-11-22 MED ORDER — SODIUM CHLORIDE 0.9% FLUSH
10.0000 mL | INTRAVENOUS | Status: DC | PRN
  Administered 2019-11-22: 10 mL via INTRAVENOUS
  Filled 2019-11-22: qty 10

## 2019-11-22 MED ORDER — SODIUM CHLORIDE 0.9 % IV SOLN
40.0000 mg/m2 | Freq: Once | INTRAVENOUS | Status: AC
  Administered 2019-11-22: 60 mg via INTRAVENOUS
  Filled 2019-11-22: qty 60

## 2019-11-22 MED ORDER — SODIUM CHLORIDE 0.9% FLUSH
10.0000 mL | INTRAVENOUS | Status: DC | PRN
  Administered 2019-11-22: 10 mL
  Filled 2019-11-22: qty 10

## 2019-11-22 NOTE — Patient Instructions (Signed)
[facility] Discharge Instructions for Patients Receiving Chemotherapy  Today you received the following chemotherapy agents: cisplatin.  To help prevent nausea and vomiting after your treatment, we encourage you to take your nausea medication as directed.   If you develop nausea and vomiting that is not controlled by your nausea medication, call the clinic.   BELOW ARE SYMPTOMS THAT SHOULD BE REPORTED IMMEDIATELY:  *FEVER GREATER THAN 100.5 F  *CHILLS WITH OR WITHOUT FEVER  NAUSEA AND VOMITING THAT IS NOT CONTROLLED WITH YOUR NAUSEA MEDICATION  *UNUSUAL SHORTNESS OF BREATH  *UNUSUAL BRUISING OR BLEEDING  TENDERNESS IN MOUTH AND THROAT WITH OR WITHOUT PRESENCE OF ULCERS  *URINARY PROBLEMS  *BOWEL PROBLEMS  UNUSUAL RASH Items with * indicate a potential emergency and should be followed up as soon as possible.  Feel free to call the clinic should you have any questions or concerns. The clinic phone number is [phone].  Please show the CHEMO ALERT CARD at check-in to the Emergency Department and triage nurse.  Cisplatin injection What is this medicine? CISPLATIN (SIS pla tin) is a chemotherapy drug. It targets fast dividing cells, like cancer cells, and causes these cells to die. This medicine is used to treat many types of cancer like bladder, ovarian, and testicular cancers. This medicine may be used for other purposes; ask your health care provider or pharmacist if you have questions. COMMON BRAND NAME(S): Platinol, Platinol -AQ What should I tell my health care provider before I take this medicine? They need to know if you have any of these conditions:  eye disease, vision problems  hearing problems  kidney disease  low blood counts, like white cells, platelets, or red blood cells  tingling of the fingers or toes, or other nerve disorder  an unusual or allergic reaction to cisplatin, carboplatin, oxaliplatin, other medicines, foods, dyes, or  preservatives  pregnant or trying to get pregnant  breast-feeding How should I use this medicine? This drug is given as an infusion into a vein. It is administered in a hospital or clinic by a specially trained health care professional. Talk to your pediatrician regarding the use of this medicine in children. Special care may be needed. Overdosage: If you think you have taken too much of this medicine contact a poison control center or emergency room at once. NOTE: This medicine is only for you. Do not share this medicine with others. What if I miss a dose? It is important not to miss a dose. Call your doctor or health care professional if you are unable to keep an appointment. What may interact with this medicine? This medicine may interact with the following medications:  foscarnet  certain antibiotics like amikacin, gentamicin, neomycin, polymyxin B, streptomycin, tobramycin, vancomycin This list may not describe all possible interactions. Give your health care provider a list of all the medicines, herbs, non-prescription drugs, or dietary supplements you use. Also tell them if you smoke, drink alcohol, or use illegal drugs. Some items may interact with your medicine. What should I watch for while using this medicine? Your condition will be monitored carefully while you are receiving this medicine. You will need important blood work done while you are taking this medicine. This drug may make you feel generally unwell. This is not uncommon, as chemotherapy can affect healthy cells as well as cancer cells. Report any side effects. Continue your course of treatment even though you feel ill unless your doctor tells you to stop. This medicine may increase your   risk of getting an infection. Call your healthcare professional for advice if you get a fever, chills, or sore throat, or other symptoms of a cold or flu. Do not treat yourself. Try to avoid being around people who are sick. Avoid taking  medicines that contain aspirin, acetaminophen, ibuprofen, naproxen, or ketoprofen unless instructed by your healthcare professional. These medicines may hide a fever. This medicine may increase your risk to bruise or bleed. Call your doctor or health care professional if you notice any unusual bleeding. Be careful brushing and flossing your teeth or using a toothpick because you may get an infection or bleed more easily. If you have any dental work done, tell your dentist you are receiving this medicine. Do not become pregnant while taking this medicine or for 14 months after stopping it. Women should inform their healthcare professional if they wish to become pregnant or think they might be pregnant. Men should not father a child while taking this medicine and for 11 months after stopping it. There is potential for serious side effects to an unborn child. Talk to your healthcare professional for more information. Do not breast-feed an infant while taking this medicine. This medicine has caused ovarian failure in some women. This medicine may make it more difficult to get pregnant. Talk to your healthcare professional if you are concerned about your fertility. This medicine has caused decreased sperm counts in some men. This may make it more difficult to father a child. Talk to your healthcare professional if you are concerned about your fertility. Drink fluids as directed while you are taking this medicine. This will help protect your kidneys. Call your doctor or health care professional if you get diarrhea. Do not treat yourself. What side effects may I notice from receiving this medicine? Side effects that you should report to your doctor or health care professional as soon as possible:  allergic reactions like skin rash, itching or hives, swelling of the face, lips, or tongue  blurred vision  changes in vision  decreased hearing or ringing of the ears  nausea, vomiting  pain, redness, or  irritation at site where injected  pain, tingling, numbness in the hands or feet  signs and symptoms of bleeding such as bloody or black, tarry stools; red or dark brown urine; spitting up blood or brown material that looks like coffee grounds; red spots on the skin; unusual bruising or bleeding from the eyes, gums, or nose  signs and symptoms of infection like fever; chills; cough; sore throat; pain or trouble passing urine  signs and symptoms of kidney injury like trouble passing urine or change in the amount of urine  signs and symptoms of low red blood cells or anemia such as unusually weak or tired; feeling faint or lightheaded; falls; breathing problems Side effects that usually do not require medical attention (report to your doctor or health care professional if they continue or are bothersome):  loss of appetite  mouth sores  muscle cramps This list may not describe all possible side effects. Call your doctor for medical advice about side effects. You may report side effects to FDA at 1-800-FDA-1088. Where should I keep my medicine? This drug is given in a hospital or clinic and will not be stored at home. NOTE: This sheet is a summary. It may not cover all possible information. If you have questions about this medicine, talk to your doctor, pharmacist, or health care provider.  2020 Elsevier/Gold Standard (2018-03-23 15:59:17)  

## 2019-11-22 NOTE — Progress Notes (Signed)
Met w/ pt to introduce myself as her Arboriculturist.  Unfortunately there aren't any foundations offering copay assistance for her Dx and the type of ins she has.  I offered the [employer], went over what it covers, gave her an expense sheet and the income requirement.  She would like to apply so she will bring proof of income on 11/25/19.  She has my card for any questions or concerns she may have in the future.

## 2019-11-22 NOTE — Patient Instructions (Signed)

## 2019-11-22 NOTE — Progress Notes (Signed)
Patient here today for first time cisplatin. patient received her 2 hour pre hydration fluids and was only able to urinate 100 ml . Ok to begin treatment per Dr. Alen Blew

## 2019-11-22 NOTE — Telephone Encounter (Signed)
xxxxx 

## 2019-11-22 NOTE — Progress Notes (Signed)
[place] CSW Progress Notes  Met w patient in infusion, discussed options for transport to upcoming surgical consult.  Requested consideration for [employer], must be approved by [employer].  Submitted request to [employer].  Transport arranged from [facility] to [place], then home at conclusion of appointment, pending CSW [employer].  Discussed arrangements w patient.  Did try to access [employer] benefits - per [employer], "medical facility to medical facility" required nonemergent EMS transport at significant cost to patient, CSW declined to schedule this on patient behalf.  Edwyna Shell, LCSW Clinical Social Worker Phone:  [phone]

## 2019-11-25 ENCOUNTER — Ambulatory Visit
Admission: RE | Admit: 2019-11-25 | Discharge: 2019-11-25 | Disposition: A | Discharge status: Home / Self Care | Payer: Medicare Other | Source: Ambulatory Visit | Attending: Radiation Oncology | Admitting: Radiation Oncology

## 2019-11-25 ENCOUNTER — Other Ambulatory Visit: Payer: Self-pay

## 2019-11-25 ENCOUNTER — Telehealth: Payer: Self-pay | Admitting: *Deleted

## 2019-11-25 ENCOUNTER — Encounter: Payer: Self-pay | Admitting: Oncology

## 2019-11-25 ENCOUNTER — Other Ambulatory Visit: Payer: Self-pay | Admitting: Radiation Oncology

## 2019-11-25 DIAGNOSIS — C139 Malignant neoplasm of hypopharynx, unspecified: Secondary | ICD-10-CM

## 2019-11-25 DIAGNOSIS — Z510 Encounter for antineoplastic radiation therapy: Secondary | ICD-10-CM | POA: Diagnosis not present

## 2019-11-25 MED ORDER — LIDOCAINE VISCOUS HCL 2 % MT SOLN: OROMUCOSAL | 3 refills | Status: DC

## 2019-11-25 MED ORDER — SONAFINE EX EMUL
1.0000 "application " | Freq: Two times a day (BID) | CUTANEOUS | Status: DC
  Administered 2019-11-25: 1 via TOPICAL

## 2019-11-25 NOTE — Telephone Encounter (Signed)
Called pt to see how she did with her treatment last week.  She denies any problems.  Reminded to call with any questions/concerns & she expressed understanding.

## 2019-11-25 NOTE — Progress Notes (Signed)
Pt is approved for the $1000 [employer] grant.  

## 2019-11-25 NOTE — Telephone Encounter (Signed)
-----   Message from Tildon Husky, RN sent at 11/22/2019  3:37 PM EDT ----- Regarding: first time cisplatin, follow up call shadad First time cisplatin follow up call Shadad patient tolerated well

## 2019-11-25 NOTE — Progress Notes (Signed)
Pt here for patient teaching.    Pt given Radiation and You booklet, Managing Acute Radiation Side Effects for Head and Neck Cancer handout, skin care instructions and Sonafine.    Reviewed areas of pertinence such as fatigue, hair loss, mouth changes, skin changes, throat changes, earaches and taste changes .   Pt able to give teach back of to pat skin, use unscented/gentle soap and drink plenty of water,apply Sonafine bid and avoid applying anything to skin within 4 hours of treatment.   Pt demonstrated understanding and verbalizes understanding of information given and will contact nursing with any questions or concerns.    Http://rtanswers.org/treatmentinformation/whattoexpect/index          

## 2019-11-26 ENCOUNTER — Other Ambulatory Visit: Payer: Self-pay

## 2019-11-26 ENCOUNTER — Ambulatory Visit
Admission: RE | Admit: 2019-11-26 | Discharge: 2019-11-26 | Disposition: A | Discharge status: Home / Self Care | Payer: Medicare Other | Source: Ambulatory Visit | Attending: Radiation Oncology | Admitting: Radiation Oncology

## 2019-11-26 DIAGNOSIS — Z510 Encounter for antineoplastic radiation therapy: Secondary | ICD-10-CM | POA: Diagnosis not present

## 2019-11-27 ENCOUNTER — Ambulatory Visit
Admission: RE | Admit: 2019-11-27 | Discharge: 2019-11-27 | Disposition: A | Discharge status: Home / Self Care | Payer: Medicare Other | Source: Ambulatory Visit | Attending: Radiation Oncology | Admitting: Radiation Oncology

## 2019-11-27 ENCOUNTER — Ambulatory Visit: Payer: Self-pay | Admitting: General Surgery

## 2019-11-27 ENCOUNTER — Encounter: Payer: Medicare Other | Admitting: Nutrition

## 2019-11-27 ENCOUNTER — Other Ambulatory Visit: Payer: Self-pay

## 2019-11-27 DIAGNOSIS — Z510 Encounter for antineoplastic radiation therapy: Secondary | ICD-10-CM | POA: Diagnosis not present

## 2019-11-27 NOTE — H&P (Signed)
History of Present Illness Tina Ok MD; 11/27/2019 11:24 AM) The patient is a 69 year old female who presents with a complaint of throat cancer. Patient is a 69 year old female with a history of pharyngeal squamous cell carcinoma, stage III-IV. Patient currently with significant weight loss. She states this inguinal approximately 3 months which was approximately the time of diagnosis. Patient had attempts at percutaneous gastrostomy tube placement without success. Patient currently undergoing chemotherapy. Patient currently has lost approximate 40 pound the last 3 months. Patient to have ongoing attritional supplementation. She states she's to meet with the dietitian tomorrow.  She's had no previous abdominal surgery.   Past Surgical History (Tina Kennedy, [place]; 11/27/2019 11:06 AM) No pertinent past surgical history   Diagnostic Studies History [facility] Teressa Kennedy, [place]; 11/27/2019 11:06 AM) Colonoscopy  never  Allergies (Tina Kennedy, CMA; 11/27/2019 11:07 AM) No Known Drug Allergies  [11/27/2019]: Allergies Reconciled   Medication History (Tina Kennedy, [place]; 11/27/2019 11:07 AM) No Current Medications Medications Reconciled  Social History (Tina Kennedy, CMA; 11/27/2019 11:06 AM) Alcohol use  Occasional alcohol use. Caffeine use  Coffee.  Family History (Tina, Lyndon Kennedy; 11/27/2019 11:06 AM) Hypertension  Mother.  Pregnancy / Birth History Tina Kennedy, [place]; 11/27/2019 11:06 AM) Age at menarche  15 years. Irregular periods  Maternal age  94-20  Other Problems (Tina Kennedy, [place]; 11/27/2019 11:06 AM) No pertinent past medical history     Review of Systems (Tina Kennedy CMA; 11/27/2019 11:06 AM) General Not Present- Appetite Loss, Chills, Fatigue, Fever, Night Sweats, Weight Gain and Weight Loss. Skin Not Present- Change in Wart/Mole, Dryness, Hives, Jaundice, New Lesions, Non-Healing Wounds, Rash and Ulcer. HEENT Not Present- Earache, Hearing Loss, Hoarseness,  Nose Bleed, Oral Ulcers, Ringing in the Ears, Seasonal Allergies, Sinus Pain, Sore Throat, Visual Disturbances, Wears glasses/contact lenses and Yellow Eyes. Respiratory Not Present- Bloody sputum, Chronic Cough, Difficulty Breathing, Snoring and Wheezing. Breast Not Present- Breast Mass, Breast Pain, Nipple Discharge and Skin Changes. Cardiovascular Not Present- Chest Pain, Difficulty Breathing Lying Down, Leg Cramps, Palpitations, Rapid Heart Rate, Shortness of Breath and Swelling of Extremities. Gastrointestinal Not Present- Abdominal Pain, Bloating, Bloody Stool, Change in Bowel Habits, Chronic diarrhea, Constipation, Difficulty Swallowing, Excessive gas, Gets full quickly at meals, Hemorrhoids, Indigestion, Nausea, Rectal Pain and Vomiting. Female Genitourinary Not Present- Frequency, Nocturia, Painful Urination, Pelvic Pain and Urgency. Musculoskeletal Not Present- Back Pain, Joint Pain, Joint Stiffness, Muscle Pain, Muscle Weakness and Swelling of Extremities. Neurological Not Present- Decreased Memory, Fainting, Headaches, Numbness, Seizures, Tingling, Tremor, Trouble walking and Weakness. Psychiatric Not Present- Anxiety, Bipolar, Change in Sleep Pattern, Depression, Fearful and Frequent crying. Endocrine Not Present- Cold Intolerance, Excessive Hunger, Hair Changes, Heat Intolerance, Hot flashes and New Diabetes. Hematology Not Present- Blood Thinners, Easy Bruising, Excessive bleeding, Gland problems, HIV and Persistent Infections.  Vitals (Tina Kennedy CMA; 11/27/2019 11:08 AM) 11/27/2019 11:07 AM Weight: 107.13 lb Height: 65in Body Surface Area: 1.52 m Body Mass Index: 17.83 kg/m  Temp.: 97.40F  Pulse: 114 (Regular)  BP: 124/76(Sitting, Left Arm, Standard)       Physical Exam Tina Ok MD; 11/27/2019 11:24 AM) The physical exam findings are as follows: Note: Constitutional: No acute distress, conversant, appears stated age  Eyes: Anicteric sclerae, moist  conjunctiva, no lid lag  Neck: No thyromegaly, trachea midline, no cervical lymphadenopathy  Lungs: Clear to auscultation biilaterally, normal respiratory effot  Cardiovascular: regular rate & rhythm, no murmurs, no peripheal edema, pedal pulses 2+  GI: Soft, no masses or hepatosplenomegaly, non-tender to palpation  MSK: Normal gait, no clubbing cyanosis, edema  Skin: No rashes, palpation reveals normal skin turgor  Psychiatric: Appropriate judgment and insight, oriented to person, place, and time    Assessment & Plan Tina Ok MD; 11/27/2019 11:25 AM) PHARYNGEAL CARCINOMA, SQUAMOUS CELL (C14.0) Impression: Patient is a 69 year old female with pharyngeal squamous cell carcinoma stage III-4. Patient with the need for enteral nutrition. This cannot be obtained percutaneously.  1. Will proceed to operating room for laparoscopic G-tube placement. 2. Discussed with her the risks and benefits of the procedure to include but not limited to: Infection, bleeding, damage to structures, possible need for further surgery. The patient voiced understanding and wishes to proceed.

## 2019-11-27 NOTE — H&P (View-Only) (Signed)
History of Present Illness Ralene Ok MD; 11/27/2019 11:24 AM) The patient is a 69 year old female who presents with a complaint of throat cancer. Patient is a 69 year old female with a history of pharyngeal squamous cell carcinoma, stage III-IV. Patient currently with significant weight loss. She states this inguinal approximately 3 months which was approximately the time of diagnosis. Patient had attempts at percutaneous gastrostomy tube placement without success. Patient currently undergoing chemotherapy. Patient currently has lost approximate 40 pound the last 3 months. Patient to have ongoing attritional supplementation. She states she's to meet with the dietitian tomorrow.  She's had no previous abdominal surgery.   Past Surgical History ([name], [place]; 11/27/2019 11:06 AM) No pertinent past surgical history   Diagnostic Studies History [facility] [name], [place]; 11/27/2019 11:06 AM) Colonoscopy  never  Allergies ([name], CMA; 11/27/2019 11:07 AM) No Known Drug Allergies  [11/27/2019]: Allergies Reconciled   Medication History ([name], [place]; 11/27/2019 11:07 AM) No Current Medications Medications Reconciled  Social History ([name], CMA; 11/27/2019 11:06 AM) Alcohol use  Occasional alcohol use. Caffeine use  Coffee.  Family History ([place], [place]; 11/27/2019 11:06 AM) Hypertension  Mother.  Pregnancy / Birth History [name], [place]; 11/27/2019 11:06 AM) Age at menarche  86 years. Irregular periods  Maternal age  74-20  Other Problems ([place], [place]; 11/27/2019 11:06 AM) No pertinent past medical history     Review of Systems ([name]; 11/27/2019 11:06 AM) General Not Present- Appetite Loss, Chills, Fatigue, Fever, Night Sweats, Weight Gain and Weight Loss. Skin Not Present- Change in Wart/Mole, Dryness, Hives, Jaundice, New Lesions, Non-Healing Wounds, Rash and Ulcer. HEENT Not Present- Earache, Hearing Loss, Hoarseness,  Nose Bleed, Oral Ulcers, Ringing in the Ears, Seasonal Allergies, Sinus Pain, Sore Throat, Visual Disturbances, Wears glasses/contact lenses and Yellow Eyes. Respiratory Not Present- Bloody sputum, Chronic Cough, Difficulty Breathing, Snoring and Wheezing. Breast Not Present- Breast Mass, Breast Pain, Nipple Discharge and Skin Changes. Cardiovascular Not Present- Chest Pain, Difficulty Breathing Lying Down, Leg Cramps, Palpitations, Rapid Heart Rate, Shortness of Breath and Swelling of Extremities. Gastrointestinal Not Present- Abdominal Pain, Bloating, Bloody Stool, Change in Bowel Habits, Chronic diarrhea, Constipation, Difficulty Swallowing, Excessive gas, Gets full quickly at meals, Hemorrhoids, Indigestion, Nausea, Rectal Pain and Vomiting. Female Genitourinary Not Present- Frequency, Nocturia, Painful Urination, Pelvic Pain and Urgency. Musculoskeletal Not Present- Back Pain, Joint Pain, Joint Stiffness, Muscle Pain, Muscle Weakness and Swelling of Extremities. Neurological Not Present- Decreased Memory, Fainting, Headaches, Numbness, Seizures, Tingling, Tremor, Trouble walking and Weakness. Psychiatric Not Present- Anxiety, Bipolar, Change in Sleep Pattern, Depression, Fearful and Frequent crying. Endocrine Not Present- Cold Intolerance, Excessive Hunger, Hair Changes, Heat Intolerance, Hot flashes and New Diabetes. Hematology Not Present- Blood Thinners, Easy Bruising, Excessive bleeding, Gland problems, HIV and Persistent Infections.  Vitals ([name]; 11/27/2019 11:08 AM) 11/27/2019 11:07 AM Weight: 107.13 lb Height: 65in Body Surface Area: 1.52 m Body Mass Index: 17.83 kg/m  Temp.: 97.3F  Pulse: 114 (Regular)  BP: 124/76(Sitting, Left Arm, Standard)       Physical Exam Ralene Ok MD; 11/27/2019 11:24 AM) The physical exam findings are as follows: Note: Constitutional: No acute distress, conversant, appears stated age  Eyes: Anicteric sclerae, moist  conjunctiva, no lid lag  Neck: No thyromegaly, trachea midline, no cervical lymphadenopathy  Lungs: Clear to auscultation biilaterally, normal respiratory effot  Cardiovascular: regular rate & rhythm, no murmurs, no peripheal edema, pedal pulses 2+  GI: Soft, no masses or hepatosplenomegaly, non-tender to palpation  MSK: Normal gait, no clubbing cyanosis, edema  Skin: No rashes, palpation reveals normal skin turgor  Psychiatric: Appropriate judgment and insight, oriented to person, place, and time    Assessment & Plan Ralene Ok MD; 11/27/2019 11:25 AM) PHARYNGEAL CARCINOMA, SQUAMOUS CELL (C14.0) Impression: Patient is a 69 year old female with pharyngeal squamous cell carcinoma stage III-4. Patient with the need for enteral nutrition. This cannot be obtained percutaneously.  1. Will proceed to operating room for laparoscopic G-tube placement. 2. Discussed with her the risks and benefits of the procedure to include but not limited to: Infection, bleeding, damage to structures, possible need for further surgery. The patient voiced understanding and wishes to proceed.

## 2019-11-28 ENCOUNTER — Other Ambulatory Visit: Payer: Self-pay

## 2019-11-28 ENCOUNTER — Ambulatory Visit: Payer: Medicare Other

## 2019-11-28 ENCOUNTER — Encounter: Payer: Self-pay | Admitting: Physical Therapy

## 2019-11-28 ENCOUNTER — Ambulatory Visit: Payer: Medicare Other | Attending: Radiation Oncology | Admitting: Physical Therapy

## 2019-11-28 ENCOUNTER — Ambulatory Visit
Admission: RE | Admit: 2019-11-28 | Discharge: 2019-11-28 | Disposition: A | Discharge status: Home / Self Care | Payer: Medicare Other | Source: Ambulatory Visit | Attending: Radiation Oncology | Admitting: Radiation Oncology

## 2019-11-28 ENCOUNTER — Encounter: Payer: Self-pay | Admitting: General Practice

## 2019-11-28 VITALS — Wt 106.4 lb

## 2019-11-28 DIAGNOSIS — R293 Abnormal posture: Secondary | ICD-10-CM

## 2019-11-28 DIAGNOSIS — C139 Malignant neoplasm of hypopharynx, unspecified: Secondary | ICD-10-CM

## 2019-11-28 DIAGNOSIS — H8113 Benign paroxysmal vertigo, bilateral: Secondary | ICD-10-CM | POA: Diagnosis present

## 2019-11-28 DIAGNOSIS — Z510 Encounter for antineoplastic radiation therapy: Secondary | ICD-10-CM | POA: Diagnosis not present

## 2019-11-28 DIAGNOSIS — R1313 Dysphagia, pharyngeal phase: Secondary | ICD-10-CM | POA: Diagnosis present

## 2019-11-28 DIAGNOSIS — R42 Dizziness and giddiness: Secondary | ICD-10-CM | POA: Insufficient documentation

## 2019-11-28 NOTE — Therapy (Signed)
[facility] [address] [place], [place], [address] Phone: [phone]   Fax:  [phone]  Speech Language Pathology Evaluation  Patient Details  Name: Tina Kennedy MRN: [id number] Date of Birth: November 18, 1950 Referring Provider (SLP): Eppie Gibson MD   Encounter Date: 11/28/2019   End of Session - 11/28/19 1631    Visit Number 1    Number of Visits 7    Date for SLP Re-Evaluation 02/26/20    SLP Start Time 1140    SLP Stop Time  1223    SLP Time Calculation (min) 43 min    Activity Tolerance Patient tolerated treatment well           No past medical history on file.  Past Surgical History:  Procedure Laterality Date  . DIRECT LARYNGOSCOPY N/A 10/15/2019   Procedure: DIRECT LARYNGOSCOPY w/BIOPSY;  Surgeon: Izora Gala, MD;  Location: [facility];  Service: ENT;  Laterality: N/A;  . IR GASTROSTOMY TUBE MOD SED  11/15/2019  . IR IMAGING GUIDED PORT INSERTION  11/15/2019  . RIGID ESOPHAGOSCOPY N/A 10/15/2019   Procedure: RIGID ESOPHAGOSCOPY;  Surgeon: Izora Gala, MD;  Location: [facility];  Service: ENT;  Laterality: N/A;    Vitals:   11/28/19 1100  Weight: 106 lb 6.4 oz (48.3 kg)     Subjective Assessment - 11/28/19 1619    Subjective Pt arrives with emesis basin c/o thickened saliva.    Currently in Pain? No/denies              SLP Evaluation [facility] - 11/28/19 1619      SLP Visit Information   SLP Received On 11/28/19    Referring Provider (SLP) Eppie Gibson MD    Onset Date approx April 2021    Medical Diagnosis SCCA of hypopharynx Stage IVA      Subjective   Patient/Family Stated Goal "get all this saliva out"      General Information   HPI Reported to ENT Constance Holster 09-24-19 2 month hx of weight loss and dysphagia - 25-30 lb loss. LAryngoscopy at that time likely esophageal or hypopharyngeal tumorwith posslbe secondary primary in larynx. Direct laryngeoscopy with biopsy 10-15-19 revealed SCCA hypopharynx. PET  11-07-19 proximal esophageal primary wiht rt retropharyngeal nodal mets with no distant mets. Unable to place PEG in Interventional Radiology due to tumor burden in pharynx so pt must undergo sx for placemnt - this is pending. She began rad tx 11-21-19 with concurent chemo weekly.      Prior Functional Status   Cognitive/Linguistic Baseline Within functional limits      Cognition   Overall Cognitive Status Within Functional Limits for tasks assessed      Oral Motor/Sensory Function   Overall Oral Motor/Sensory Function Appears within functional limits for tasks assessed      Motor Speech   Overall Motor Speech Appears within functional limits for tasks assessed    Phonation Wet   before POs-likely 2dary incr'd secretions/tumor placement   Effective Techniques --   effortful swallow - sometimes effective        Pt currently tolerates some variable dys I foods (I.e., pudding, yogurt is better than applesauce).  POs: Pt drank sips water with consistent throat clearing. Thyroid elevation appeared decr'd, and swallows appeared timely. See "plan" for more details about pt swallow function.  Because data states the risk for dysphagia during and after radiation treatment is high due to undergoing radiation tx, SLP taught pt about the possibility of reduced/limited ability for  PO intake during rad tx. SLP encouraged pt to continue swallowing POs as far into rad tx as possible, even ingesting POs and/or completing HEP shortly after administration of pain meds. Among other modifications for days when pt cannot functionally swallow, SLP talked about performing only non-swallowing tasks on the handout/HEP, and then adding swallowing tasks back in when it becomes possible to do so.  SLP educated pt re: changes to swallowing musculature after rad tx, and why adherence to dysphagia HEP provided today and PO consumption was necessary to inhibit muscular disuse atrophy and to reduce muscle fibrosis following rad  tx. Pt demonstrated understanding of these things to SLP.    SLP then developed a HEP for pt and pt was instructed how to perform exercises involving lingual, vocal, and pharyngeal strengthening. SLP performed each exercise and pt return demonstrated each exercise. SLP ensured pt performance was correct prior to moving on to next exercise. Pt was instructed to complete this program 2-3 times a day, 6-7 days/week until 6 months after her last rad tx, then x2 a week after that.                   SLP Education - 11/28/19 1630    Education Details HEP procedure, late effects head/neck radiation on swallow function, effortful swallow with liquids    Person(s) Educated Patient    Methods Explanation;Demonstration;Verbal cues;Handout    Comprehension Verbalized understanding;Returned demonstration;Verbal cues required;Need further instruction            SLP Short Term Goals - 11/28/19 1637      SLP SHORT TERM GOAL #1   Title pt will complete HEP with rare min A    Time 2    Period --   sessions, for all STGs   Status New      SLP SHORT TERM GOAL #2   Title pt will tell SLP why pt is completing HEP with modified independence    Time 1    Status New      SLP SHORT TERM GOAL #3   Title pt will describe 3 overt s/s aspiration PNA with modified independence    Time 2    Status New      SLP SHORT TERM GOAL #4   Title pt will tell SLP how a food journal could hasten return to a more normalized diet    Time 3    Status New            SLP Long Term Goals - 11/28/19 1638      SLP LONG TERM GOAL #1   Title pt will complete HEP with modified independence over two visits    Time 4    Period --   sessions, for all LTGs     SLP LONG TERM GOAL #2   Title pt will describe how to modify HEP over time, and the timeline associated with reduction in HEP frequency with modified independence over two sessions    Time 6    Status New            Plan - 11/28/19 1631     Clinical Impression Statement At this time pt swallowing with some difficulty with dys I and thin but pt reports "getting down ok." No overt s/sx aspiration PNA today, nor any reported to SLP by pt. Pt had "wet" voice with throat clearing and expectorating thckened saliva prior to POs, and pt cont'd with "wet" voice both during and after POs. "Wet"  voice thorughout entire session likely due to incr'd retention of secretions in pharynx due to tumor placement. Pt was better with smaller sips with HEP and SLP mentioned this to pt to do from now on - take smaller sips to manage suspected residuals better. Pt with pending PEG placement - SLP told pt about muscle disuse atrophy and strongly indicated important to have something PO every day even if getting nutriotion/hydration with PEG. SLP designed an individualized HEP for dysphagia and pt completed each exercise on their own with usual min cues.Data indicate that pt's swallow ability will likely decrease over the course of radiation therapy and could very well decline over time following conclusion of their radiation therapy due to muscle disuse atrophy and/or muscle fibrosis. Pt will cont to need to be seen by SLP in order to assess safety of PO intake, assess the need for recommending any objective swallow assessment, and ensuring pt correctly completes the individualized HEP.    Speech Therapy Frequency --   once approx every 4 weeks   Duration --   7 total visits   Treatment/Interventions Aspiration precaution training;Pharyngeal strengthening exercises;Diet toleration management by SLP;Trials of upgraded texture/liquids;Patient/family education;SLP instruction and feedback;Compensatory strategies    Potential to Achieve Goals Fair    Potential Considerations Severity of impairments    SLP Home Exercise Plan provided today    Consulted and Agree with Plan of Care Patient           Patient will benefit from skilled therapeutic intervention in order to  improve the following deficits and impairments:   Pharyngeal dysphagia    Problem List Patient Active Problem List   Diagnosis Date Noted  . 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 Goals of care, counseling/discussion 10/31/2019  . 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 [place], [place], [address] Phone: [phone]   Fax:  [phone]  Name: Tina Kennedy MRN: [id number] Date of Birth: March 23, 1951

## 2019-11-28 NOTE — Progress Notes (Signed)
[facility] Initial Psychosocial Assessment Clinical Social Work  Clinical Social Work contacted by phone to assess psychosocial, emotional, mental health, and spiritual needs of the patient.   Barriers to care/review of distress screen:  - Transportation:  Do you anticipate any problems getting to appointments?  Do you have someone who can help run errands for you if you need it?  Not independent in transport.  Uses [facility], can occasionally get rides w sister w enough advance notice.  She is reluctant to ask for help, states her family is busy and she does not like to bother people w requests for help.  She does struggle w vertigo and is a fall risk, cannot get around well by herself.  Sometimes uses wheelchair post treatment due to vertigo but is determined to stay active and do what she can for herself.  - Help at home:  What is your living situation (alone, family, other)?  If you are physically unable to care for yourself, who would you call on to help you?  Lives alone, sister is nearby but not always available. - Support system:  What does your support system look like?  Who would you call on if you needed some kind of practical help?  What if you needed someone to talk to for emotional support?  Limited social support, somewhat isolated.   - Finances:  Are you concerned about finances.  Considering returning to work?  If not, applying for disability?  Used to work at [employer], has been approved for  [employer] to help w living expenses. Has [facility] Medicare and social security.  Finances are tight .    What is your understanding of where you are with your cancer? Its cause?  Your treatment plan and what happens next?  Diagnosed with squamous cell carcinoma of hypopharynx.  In concurrent radiation and chemotherapy treatment.  Will have PEG tube placed next week due to inability to maintain adequate nutrition by mouth.     What are your worries for the future as you begin treatment for  cancer?  Worried about "how many containers" she needs to consume per day w tube feeds.  Of note, she has missed appointments w dietitian to do tube feeding teaching - she has an appt on 8/23 for this purpose.  She is struggling w vertigo, especially after radiation treatments.  She pushes herself to remain active but she is also concerned about falling.    What are your hopes and priorities during your treatment? What is important to you? What are your goals for your care?  Wants to "return to normal", eat like I used to be able to.  CSW Summary:  Patient and family psychosocial functioning including strengths, limitations, and coping skills:   69 year old retired female, diagnosed w cancer in hypopharynx, is part way through treatment.  Has struggled to maintain weight.  Is socially isolated - few reliable people she can depend on to help.  She relies on [facility] resources.  She is also determined to do what "I need to do" to care for herself during treatment.  Benefits from clear and somewhat concrete instructions about self care as she wants to do what she can to help herself.    Identifications of barriers to care: social isolation, lack of transportation, lack of social support.    Availability of community resources:  [place], [employer].  Can also be referred to [facility] [place] transportation if rides outside  of [place] are needed.    Clinical Social Worker follow up needed: Yes.    Will touch base in two weeks re needs/progress.    Edwyna Shell, LCSW Clinical Social Worker Phone:  [phone] Cell:  [phone]

## 2019-11-28 NOTE — Patient Instructions (Signed)
SWALLOWING EXERCISES Do these until 6 months after your last day of radiation, then 2-3 times per week afterwards  1. Effortful Swallows - Press your tongue against the roof of your mouth for 3 seconds, then squeeze the muscles in your neck while you swallow your saliva or a sip of water - Repeat 10-15 times, 2-3 times a day, and use whenever you eat or drink  2. Masako Swallow - swallow with your tongue sticking out - Stick tongue out past your teeth and gently bite tongue with your teeth - Swallow, while holding your tongue with your teeth - Repeat 10-15 times, 2-3 times a day *use a wet spoon if your mouth gets dry*  3. Pitch Raise - Repeat "he", once per second in as high of a pitch as you can - Repeat 20 times, 2-3 times a day  4. Shaker Exercise - head lift   (or #8) - Lie flat on your back in your bed or on a couch without pillows - Raise your head and look at your feet - KEEP YOUR SHOULDERS DOWN - HOLD FOR 45-60 SECONDS, then lower your head back down - Repeat 3 times, 2-3 times a day  5. Mendelsohn Maneuver - "half swallow" exercise - Start to swallow, and keep your Adam's apple up by squeezing hard with the muscles of the throat - Hold the squeeze for 5-7 seconds and then relax - Repeat 10-15 times, 2-3 times a day *use a wet spoon if your mouth gets dry*  6. Chin pushback  (or #4) - Open your mouth  - Place your fist UNDER your chin near your neck - Tuck your chin and push back with your fist for 5 seconds - Repeat 10 times, 2-3 times a day       7. "Super Swallow"  - Take a breath and hold it  - Bear down (like pushing your bowels)  - Swallow then IMMEDIATELY cough  - Repeat 10 times, 2-3 times a day

## 2019-11-28 NOTE — Progress Notes (Addendum)
Oncology Nurse Navigator Documentation  I met with Ms. Lofquist during [facility] Neck [facility] today. She reports to me that her PEG tube has been scheduled to be placed on 8/25 by Dr. Rosendo Gros. She is scheduled to have a COVID test at [address] on Saturday 8/21. She reports that she is unable to secure a ride to that appointment due to family concerns with no transportation available. I have contacted pre-surgical testing to inquire about a day of COVID test, and currently awaiting a return phone call. Ms. Tunks voices no other concerns at this time. She knows to call me if she has any further needs or concerns.  Addendum: I have spoken with presurgical testing at [facility]. They are able to provided same day COVID testing due to patients inability to drive to (or get transportation) to 8/21 COVID test.   Harlow Asa RN, BSN, [place] at [facility] Phone # [phone]  Fax # [phone]

## 2019-11-28 NOTE — Therapy (Signed)
[place], [place], [address] Phone: [phone]   Fax:  [phone]  Physical Therapy Evaluation  Patient Details  Name: Tina Kennedy MRN: [id number] Date of Birth: 1950/07/25 Referring Provider (PT): Reita May Date: 11/28/2019   PT End of Session - 11/28/19 1115    Visit Number 1    Number of Visits 9    Date for PT Re-Evaluation 01/02/20    PT Start Time 1009    PT Stop Time 1040    PT Time Calculation (min) 31 min    Activity Tolerance Patient tolerated treatment well    Behavior During Therapy [facility] for tasks assessed/performed           History reviewed. No pertinent past medical history.  Past Surgical History:  Procedure Laterality Date   DIRECT LARYNGOSCOPY N/A 10/15/2019   Procedure: DIRECT LARYNGOSCOPY w/BIOPSY;  Surgeon: Izora Gala, MD;  Location: [facility];  Service: ENT;  Laterality: N/A;   IR GASTROSTOMY TUBE MOD SED  11/15/2019   IR IMAGING GUIDED PORT INSERTION  11/15/2019   RIGID ESOPHAGOSCOPY N/A 10/15/2019   Procedure: RIGID ESOPHAGOSCOPY;  Surgeon: Izora Gala, MD;  Location: [facility];  Service: ENT;  Laterality: N/A;    There were no vitals filed for this visit.    Subjective Assessment - 11/28/19 1113    Subjective I am dizzy when I move around and turn my head.    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 No/denies     Pain Score 0-No pain              [facility] PT Assessment - 11/28/19 0001      Assessment   Medical Diagnosis squamous cell carcinoma of hypopharyinx    Referring Provider (PT) Isidore Moos    Onset Date/Surgical Date 09/24/19    Hand Dominance Right    Prior Therapy none      Precautions   Precautions Other (comment)    Precaution Comments active cancer      Restrictions   Weight Bearing Restrictions No      Balance Screen   Has the patient fallen in the past 6 months No    Has the patient had a decrease in activity level because of a fear of falling?  No    Is the patient reluctant to leave their home because of a fear of falling?  No      Home Environment   Living Environment Private residence    Living Arrangements Alone    Available Help at Discharge Family    Type of [place] to enter    Entrance Stairs-Number of Steps 3    Entrance Stairs-Rails Can reach both    [place] One level      Prior Function   Level of [place] Unemployed    Leisure does not currently exercise      Cognition   Overall Cognitive Status  Within Functional Limits for tasks assessed      Functional Tests   Functional tests Sit to Stand      Sit to Stand   Comments 30 sec sit to stand: 7 reps which is poor for her age but she believes it is due to the dizziness      Posture/Postural Control   Posture/Postural Control Postural limitations    Postural Limitations Rounded Shoulders      ROM / Strength   AROM / PROM / Strength AROM      AROM   Overall AROM  Within functional limits for tasks performed    Overall AROM Comments shoulder ROM WFL    AROM Assessment Site Cervical    Cervical Flexion WFL    Cervical Extension WFL but caused dizziness    Cervical - Right Side [facility] but cause dizziness    Cervical - Left Side [facility] but caused dizziness    Cervical - Right Rotation WFL but caused dizziness    Cervical - Left Rotation  WFL but caused dizziness      Ambulation/Gait   Ambulation/Gait Yes    Ambulation/Gait Assistance 6: Modified independent (Device/Increase time)    Ambulation Distance (Feet) 15 Feet    Gait Pattern Decreased arm swing - right;Decreased arm swing - left;Decreased step length - right;Decreased step length - left;Decreased hip/knee flexion - right;Decreased hip/knee flexion - left;Decreased trunk rotation   pt feels she was walking differently due to dizziness   Ambulation Surface Level             LYMPHEDEMA/ONCOLOGY QUESTIONNAIRE - 11/28/19 0001      Type   Cancer Type squamous cell carcinoma of hypopharynx      Treatment   Active Chemotherapy Treatment Yes    Active Radiation Treatment Yes      Lymphedema Assessments   Lymphedema Assessments Head and Neck      Head and Neck   4 cm superior to sternal notch around neck 33 cm    6 cm superior to sternal notch around neck 32.5 cm    8 cm superior to sternal notch around neck 33.6 cm                   Objective measurements completed on examination: See above findings.               PT Education - 11/28/19 1114    Education Details Neck ROM, importance of posture when sitting, standing and lying down, deep breathing, walking program and importance of staying active throughout treatment, CURE article on staying active, "Why exercise?" flyer, lymphedema and PT info    Person(s) Educated Patient    Methods Explanation;Handout    Comprehension Verbalized understanding               PT Long Term Goals - 11/28/19 1123      PT LONG TERM GOAL #1   Title Pt will report she is able to turn her head and rotate her head without increased dizziness.    Baseline has to hold on to chair due to increased dizziness    Time 5    Period Weeks    Status New    Target Date 01/02/20      PT LONG TERM GOAL #2   Title Pt will be able to change position (standing to sitting or sitting to standing etc) without  increase in dizziness.    Baseline gets very dizzy with positional  changes    Time 5    Period Weeks    Status New    Target Date 01/02/20              Head and Neck Clinic Goals - 11/28/19 1124      Patient will be able to verbalize understanding of a home exercise program for cervical range of motion, posture, and walking.    Status Achieved      Patient will be able to verbalize understanding of proper sitting and standing posture.    Status Achieved      Patient will be able to verbalize understanding of lymphedema risk and availability of treatment for this condition.    Status Achieved              Plan - 11/28/19 1117    Clinical Impression Statement Pt presents to head and neck clinic with recently diagnosed squamous cell carcinoma of hypopharynx. She is currently undergoing chemo and radiation to bilateral neck and pharynx. Her ROM is [facility] but she has dizziness when turning her head and changing positions. She reports this started more recently. Pt ambulates with decreased trunk rotation, arm swing and decreased hip flexion. She reports it is due to a fear of falling due to the dizziness.Educated pt about signs and symptoms of lymphedema as well as anatomy and physiology of lymphatic system. Educated her in importance of staying as active as possible throughout treatment to decrease fatigue and in head and neck ROM exercises to decrease loss of ROM. Pt would benefit from skilled PT services to help decrease dizziness when changing positions to decrease fall risk.    Stability/Clinical Decision Making Stable/Uncomplicated    Clinical Decision Making Low    Rehab Potential Good    PT Frequency 2x / week    PT Duration --   5 weeks   PT Treatment/Interventions [place] Repostioning;ADLs/Self Care Home Management;Therapeutic exercise;Therapeutic activities;Neuromuscular re-education;Patient/family education;Manual techniques;Passive range of motion    PT  Next Visit Plan begin treatment for possible BPPV, reassess neck circumferences    PT Home Exercise Plan head and neck ROM exercises    Consulted and Agree with Plan of Care Patient           Patient will benefit from skilled therapeutic intervention in order to improve the following deficits and impairments:  Postural dysfunction, Decreased knowledge of precautions, Dizziness  Visit Diagnosis: BPPV (benign paroxysmal positional vertigo), bilateral  Dizziness and giddiness  Abnormal posture  Malignant neoplasm of hypopharyngeal wall [facility])     Problem List Patient Active Problem List   Diagnosis Date Noted   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

## 2019-11-29 ENCOUNTER — Ambulatory Visit
Admission: RE | Admit: 2019-11-29 | Discharge: 2019-11-29 | Disposition: A | Discharge status: Home / Self Care | Payer: Medicare Other | Source: Ambulatory Visit | Attending: Radiation Oncology | Admitting: Radiation Oncology

## 2019-11-29 ENCOUNTER — Inpatient Hospital Stay: Payer: Medicare Other

## 2019-11-29 ENCOUNTER — Ambulatory Visit: Payer: Self-pay | Admitting: General Surgery

## 2019-11-29 ENCOUNTER — Encounter (HOSPITAL_COMMUNITY): Payer: Self-pay

## 2019-11-29 ENCOUNTER — Inpatient Hospital Stay: Payer: Medicare Other | Admitting: General Practice

## 2019-11-29 ENCOUNTER — Other Ambulatory Visit: Payer: Self-pay

## 2019-11-29 VITALS — BP 131/74 | HR 76 | Temp 98.3°F | Resp 16

## 2019-11-29 DIAGNOSIS — C139 Malignant neoplasm of hypopharynx, unspecified: Secondary | ICD-10-CM

## 2019-11-29 DIAGNOSIS — Z510 Encounter for antineoplastic radiation therapy: Secondary | ICD-10-CM | POA: Diagnosis not present

## 2019-11-29 DIAGNOSIS — Z5111 Encounter for antineoplastic chemotherapy: Secondary | ICD-10-CM | POA: Diagnosis not present

## 2019-11-29 DIAGNOSIS — C132 Malignant neoplasm of posterior wall of hypopharynx: Secondary | ICD-10-CM

## 2019-11-29 LAB — CBC WITH DIFFERENTIAL (CANCER CENTER ONLY)
Abs Immature Granulocytes: 0.02 10*3/uL (ref 0.00–0.07)
Basophils Absolute: 0 10*3/uL (ref 0.0–0.1)
Basophils Relative: 1 %
Eosinophils Absolute: 0 10*3/uL (ref 0.0–0.5)
Eosinophils Relative: 0 %
HCT: 39.6 % (ref 36.0–46.0)
Hemoglobin: 14 g/dL (ref 12.0–15.0)
Immature Granulocytes: 1 %
Lymphocytes Relative: 15 %
Lymphs Abs: 0.6 10*3/uL — ABNORMAL LOW (ref 0.7–4.0)
MCH: 28.2 pg (ref 26.0–34.0)
MCHC: 35.4 g/dL (ref 30.0–36.0)
MCV: 79.8 fL — ABNORMAL LOW (ref 80.0–100.0)
Monocytes Absolute: 0.6 10*3/uL (ref 0.1–1.0)
Monocytes Relative: 15 %
Neutro Abs: 2.6 10*3/uL (ref 1.7–7.7)
Neutrophils Relative %: 68 %
Platelet Count: 291 10*3/uL (ref 150–400)
RBC: 4.96 MIL/uL (ref 3.87–5.11)
RDW: 12.9 % (ref 11.5–15.5)
WBC Count: 3.8 10*3/uL — ABNORMAL LOW (ref 4.0–10.5)
nRBC: 0 % (ref 0.0–0.2)

## 2019-11-29 LAB — CMP (CANCER CENTER ONLY)
ALT: 11 U/L (ref 0–44)
AST: 23 U/L (ref 15–41)
Albumin: 2.5 g/dL — ABNORMAL LOW (ref 3.5–5.0)
Alkaline Phosphatase: 81 U/L (ref 38–126)
Anion gap: 10 (ref 5–15)
BUN: 6 mg/dL — ABNORMAL LOW (ref 8–23)
CO2: 29 mmol/L (ref 22–32)
Calcium: 8.9 mg/dL (ref 8.9–10.3)
Chloride: 92 mmol/L — ABNORMAL LOW (ref 98–111)
Creatinine: 0.58 mg/dL (ref 0.44–1.00)
GFR, Est AFR Am: >60 mL/min (ref >60)
GFR, Estimated: >60 mL/min (ref >60)
Glucose, Bld: 118 mg/dL — ABNORMAL HIGH (ref 70–99)
Potassium: 3.8 mmol/L (ref 3.5–5.1)
Sodium: 131 mmol/L — ABNORMAL LOW (ref 135–145)
Total Bilirubin: 0.6 mg/dL (ref 0.3–1.2)
Total Protein: 6.5 g/dL (ref 6.5–8.1)

## 2019-11-29 MED ORDER — SODIUM CHLORIDE 0.9 % IV SOLN
10.0000 mg | Freq: Once | INTRAVENOUS | Status: AC
  Administered 2019-11-29: 10 mg via INTRAVENOUS
  Filled 2019-11-29: qty 10

## 2019-11-29 MED ORDER — PALONOSETRON HCL INJECTION 0.25 MG/5ML
INTRAVENOUS | Status: AC
  Filled 2019-11-29: qty 5

## 2019-11-29 MED ORDER — HEPARIN SOD (PORK) LOCK FLUSH 100 UNIT/ML IV SOLN
500.0000 [IU] | Freq: Once | INTRAVENOUS | Status: AC | PRN
  Administered 2019-11-29: 500 [IU]
  Filled 2019-11-29: qty 5

## 2019-11-29 MED ORDER — POTASSIUM CHLORIDE 2 MEQ/ML IV SOLN
Freq: Once | INTRAVENOUS | Status: AC
  Filled 2019-11-29: qty 10

## 2019-11-29 MED ORDER — SODIUM CHLORIDE 0.9 % IV SOLN
150.0000 mg | Freq: Once | INTRAVENOUS | Status: AC
  Administered 2019-11-29: 150 mg via INTRAVENOUS
  Filled 2019-11-29: qty 150

## 2019-11-29 MED ORDER — SODIUM CHLORIDE 0.9% FLUSH
10.0000 mL | INTRAVENOUS | Status: DC | PRN
  Administered 2019-11-29: 10 mL
  Filled 2019-11-29: qty 10

## 2019-11-29 MED ORDER — SODIUM CHLORIDE 0.9 % IV SOLN
Freq: Once | INTRAVENOUS | Status: AC
  Filled 2019-11-29: qty 250

## 2019-11-29 MED ORDER — SODIUM CHLORIDE 0.9 % IV SOLN
40.0000 mg/m2 | Freq: Once | INTRAVENOUS | Status: AC
  Administered 2019-11-29: 60 mg via INTRAVENOUS
  Filled 2019-11-29: qty 60

## 2019-11-29 MED ORDER — PALONOSETRON HCL INJECTION 0.25 MG/5ML
0.2500 mg | Freq: Once | INTRAVENOUS | Status: AC
  Administered 2019-11-29: 0.25 mg via INTRAVENOUS

## 2019-11-29 NOTE — Progress Notes (Signed)
COVID Vaccine Completed: Date COVID Vaccine completed: COVID vaccine manufacturer: [place]   PCP -  Cardiologist -   Chest x-ray -  EKG -  Stress Test -  ECHO -  Cardiac Cath -   Sleep Study -  CPAP -   Fasting Blood Sugar -  Checks Blood Sugar _____ times a day  Blood Thinner Instructions: Aspirin Instructions: Last Dose:  Anesthesia review:   Patient denies shortness of breath, fever, cough and chest pain at PAT appointment   Patient verbalized understanding of instructions that were given to them at the PAT appointment. Patient was also instructed that they will need to review over the PAT instructions again at home before surgery.

## 2019-11-29 NOTE — Patient Instructions (Signed)
DUE TO COVID-19 ONLY ONE VISITOR IS ALLOWED TO COME WITH YOU AND STAY IN THE WAITING ROOM ONLY DURING PRE OP AND PROCEDURE.    COVID SWAB TESTING MUST BE COMPLETED ON:  Saturday, Aug. 21, 2021 at 1005 AM   [address] [place], [place] [address]  (Must self quarantine after testing. Follow instructions on handout.)       Your procedure is scheduled on: Wednesday, Aug. 25, 2021   Report to [facility]  Entrance    Report to admitting at 6:30 AM   Call this number if you have problems the morning of surgery [phone]   Do not eat food :After Midnight.   May have liquids until  5:30 AM  day of surgery  CLEAR LIQUID DIET  Foods Allowed                                                                     Foods Excluded  Water, Black Coffee and tea, regular and decaf                             liquids that you cannot  Plain Jell-O in any flavor  (No red)                                           see through such as: Fruit ices (not with fruit pulp)                                     milk, soups, orange juice              Iced Popsicles (No red)                                    All solid food                                   Apple juices Sports drinks like Gatorade (No red) Lightly seasoned clear broth or consume(fat free) Sugar, honey syrup  Sample Menu Breakfast                                Lunch                                     Supper Cranberry juice                    Beef broth                            Chicken broth Jell-O  Grape juice                           Apple juice Coffee or tea                        Jell-O                                      Popsicle                                                Coffee or tea                        Coffee or tea      Oral Hygiene is also important to reduce your risk of infection.                                    Remember - BRUSH YOUR TEETH THE MORNING OF SURGERY WITH  YOUR REGULAR TOOTHPASTE   Do NOT smoke after Midnight   Take these medicines the morning of surgery with A SIP OF WATER: None                               You may not have any metal on your body including hair pins, jewelry, and body piercings             Do not wear make-up, lotions, powders, perfumes/cologne, or deodorant             Do not wear nail polish.  Do not shave  48 hours prior to surgery.              Do not bring valuables to the hospital. [place].   Contacts, dentures or bridgework may not be worn into surgery.    Patients discharged the day of surgery will not be allowed to drive home.   Special Instructions: Bring a copy of your healthcare power of attorney and living will documents         the day of surgery if you haven't scanned them in before.              Please read over the following fact sheets you were given: IF YOU HAVE QUESTIONS ABOUT YOUR PRE OP INSTRUCTIONS PLEASE CALL [phone]   [place] - Preparing for Surgery Before surgery, you can play an important role.  Because skin is not sterile, your skin needs to be as free of germs as possible.  You can reduce the number of germs on your skin by washing with CHG (chlorahexidine gluconate) soap before surgery.  CHG is an antiseptic cleaner which kills germs and bonds with the skin to continue killing germs even after washing. Please DO NOT use if you have an allergy to CHG or antibacterial soaps.  If your skin becomes reddened/irritated stop using the CHG and inform your nurse when you arrive at Short Stay.  Do not shave (including legs and underarms) for at least 48 hours prior to the first CHG shower.  You may shave your face/neck.  Please follow these instructions carefully:  1.  Shower with CHG Soap the night before surgery and the  morning of surgery.  2.  If you choose to wash your hair, wash your hair first as usual with your normal  shampoo.  3.   After you shampoo, rinse your hair and body thoroughly to remove the shampoo.                             4.  Use CHG as you would any other liquid soap.  You can apply chg directly to the skin and wash.  Gently with a scrungie or clean washcloth.  5.  Apply the CHG Soap to your body ONLY FROM THE NECK DOWN.   Do   not use on face/ open                           Wound or open sores. Avoid contact with eyes, ears mouth and   genitals (private parts).                       Wash face,  Genitals (private parts) with your normal soap.             6.  Wash thoroughly, paying special attention to the area where your    surgery  will be performed.  7.  Thoroughly rinse your body with warm water from the neck down.  8.  DO NOT shower/wash with your normal soap after using and rinsing off the CHG Soap.                9.  Pat yourself dry with a clean towel.            10.  Wear clean pajamas.            11.  Place clean sheets on your bed the night of your first shower and do not  sleep with pets. Day of Surgery : Do not apply any lotions/deodorants the morning of surgery.  Please wear clean clothes to the hospital/surgery center.  FAILURE TO FOLLOW THESE INSTRUCTIONS MAY RESULT IN THE CANCELLATION OF YOUR SURGERY  PATIENT SIGNATURE_________________________________  NURSE SIGNATURE__________________________________  ________________________________________________________________________

## 2019-11-29 NOTE — Progress Notes (Signed)
[facility] CSW Progress Notes  Met w patient in infusion, discussed transportation challenges.  Completed and submitted application for [facility] transport.  Once approved she can schedule rides for herself if she needs help getting to appointments to providers not served by [employer].  She is aware she needs to schedule these rides herself and give [facility] appropriate advance notice.   Edwyna Shell, LCSW Clinical Social Worker Phone:  [phone]

## 2019-11-29 NOTE — Progress Notes (Signed)
COVID Vaccine Completed: Date COVID Vaccine completed: COVID vaccine manufacturer: [place]   PCP -  Cardiologist -   Chest x-ray -  EKG -  Stress Test -  ECHO -  Cardiac Cath -   Sleep Study -  CPAP -   Fasting Blood Sugar -  Checks Blood Sugar _____ times a day  Blood Thinner Instructions: Aspirin Instructions: Last Dose:  Anesthesia review:   Patient denies shortness of breath, fever, cough and chest pain at PAT appointment   Patient verbalized understanding of instructions that were given to them at the PAT appointment. Patient was also instructed that they will need to review over the PAT instructions again at home before surgery.

## 2019-11-29 NOTE — Patient Instructions (Signed)

## 2019-11-29 NOTE — Patient Instructions (Signed)
[facility] Discharge Instructions for Patients Receiving Chemotherapy  Today you received the following chemotherapy agents Cisplatin  To help prevent nausea and vomiting after your treatment, we encourage you to take your nausea medication as directed  If you develop nausea and vomiting that is not controlled by your nausea medication, call the clinic.   BELOW ARE SYMPTOMS THAT SHOULD BE REPORTED IMMEDIATELY:  *FEVER GREATER THAN 100.5 F  *CHILLS WITH OR WITHOUT FEVER  NAUSEA AND VOMITING THAT IS NOT CONTROLLED WITH YOUR NAUSEA MEDICATION  *UNUSUAL SHORTNESS OF BREATH  *UNUSUAL BRUISING OR BLEEDING  TENDERNESS IN MOUTH AND THROAT WITH OR WITHOUT PRESENCE OF ULCERS  *URINARY PROBLEMS  *BOWEL PROBLEMS  UNUSUAL RASH Items with * indicate a potential emergency and should be followed up as soon as possible.  Feel free to call the clinic should you have any questions or concerns. The clinic phone number is [phone].  Please show the CHEMO ALERT CARD at check-in to the Emergency Department and triage nurse.   

## 2019-11-29 NOTE — Progress Notes (Signed)
Pt as voided a total of 166ml of urine since start of pre-hydration. Per Dr. Alen Blew, [place] to proceed with Cisplatin

## 2019-11-29 NOTE — Progress Notes (Signed)
Spoke to [employer], RN, BSN, OCN due to transportation issues, pt is unable to secure a ride to the COVID testing site prior to her scheduled surgery, and they would like COVID testing done the morning of surgery. This information provided to [facility] via [employer].

## 2019-11-30 ENCOUNTER — Inpatient Hospital Stay (HOSPITAL_COMMUNITY): Admission: RE | Admit: 2019-11-30 | Payer: Medicare Other | Source: Ambulatory Visit

## 2019-12-02 ENCOUNTER — Encounter (HOSPITAL_COMMUNITY): Payer: Self-pay | Admitting: General Surgery

## 2019-12-02 ENCOUNTER — Ambulatory Visit
Admission: RE | Admit: 2019-12-02 | Discharge: 2019-12-02 | Disposition: A | Discharge status: Home / Self Care | Payer: Medicare Other | Source: Ambulatory Visit | Attending: Radiation Oncology | Admitting: Radiation Oncology

## 2019-12-02 ENCOUNTER — Other Ambulatory Visit: Payer: Self-pay

## 2019-12-02 ENCOUNTER — Inpatient Hospital Stay (HOSPITAL_COMMUNITY)
Admission: RE | Admit: 2019-12-02 | Discharge: 2019-12-02 | Disposition: A | Discharge status: Home / Self Care | Payer: Medicare Other | Source: Ambulatory Visit

## 2019-12-02 ENCOUNTER — Inpatient Hospital Stay: Payer: Medicare Other | Admitting: Nutrition

## 2019-12-02 DIAGNOSIS — Z510 Encounter for antineoplastic radiation therapy: Secondary | ICD-10-CM | POA: Diagnosis not present

## 2019-12-02 HISTORY — DX: Malignant neoplasm of hypopharynx, unspecified: C13.9

## 2019-12-02 NOTE — Progress Notes (Signed)
Anesthesia Review: Pt currently receiving chemo and radiation at [facility].  Followed by Dr Alen Blew Has pharyngeal cancer  Labs of CBC and CMP done 11/29/19 Having Gtube placement on 12/04/19.  Still able to eat per pt.   PCP: Cardiologist : Chest x-ray : EKG : Echo : Stress test: Cardiac Cath :  Activity level:  Sleep Study/ CPAP : Fasting Blood Sugar :      / Checks Blood Sugar -- times a day:   Blood Thinner/ Instructions /Last Dose: ASA / Instructions/ Last Dose :

## 2019-12-02 NOTE — Progress Notes (Signed)
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  She is followed by Dr. Alen Blew and Dr. Isidore Moos.  Past medical history is not significant.  Medications include Compazine.  Labs include sodium 131, glucose 118, albumin 2.5 on August 20.  Height: 5 feet 5 inches. Weight: 109.4 pounds. Usual body weight: 114 pounds in July 2021.  Patient reports usual body weight is 145 pounds. (? Timeframe) BMI: 18.21.  Estimated nutrition needs: 1700-2000 cal, 75-100 g protein, 2.0 L fluid.  Patient is scheduled to have a surgical G-tube placed on August 25. She is currently drinking a variety of liquids including boost.  She tolerates soft foods without difficulty. Patient reports thick mucus and dry mouth. Patient has lost 25% of her usual body weight and 4% body weight over 6 weeks.  Nutrition diagnosis:  Inadequate oral intake related to cancer and associated treatments as evidenced by 25% weight loss from usual body weight.  Intervention: Educated patient to increase calories and protein in small frequent meals and snacks throughout the day. Reviewed options for high-calorie high-protein snacks. Educated patient on strategies for improving thick mucus. Encouraged baking soda and salt water rinses. Recommended patient increase oral nutrition supplements twice daily between meals. Provided 1 complementary case of Ensure Enlive. I provided very brief education on using her feeding tube.  Will provide additional information at next visit. Provided fact sheets.  Questions were answered.  Teach back method used.  Contact information was provided.  Monitoring, evaluation, goals: Patient will work to increase calories and protein to support weight maintenance/weight gain and adequate healing. Will monitor for decreased oral intake and weight loss and then begin tube feeding.  Next visit: Tuesday, August 31 after radiation  therapy.  **Disclaimer: This note was dictated with voice recognition software. Similar sounding words can inadvertently be transcribed and this note may contain transcription errors which may not have been corrected upon publication of note.**

## 2019-12-03 ENCOUNTER — Ambulatory Visit
Admission: RE | Admit: 2019-12-03 | Discharge: 2019-12-03 | Disposition: A | Discharge status: Home / Self Care | Payer: Medicare Other | Source: Ambulatory Visit | Attending: Radiation Oncology | Admitting: Radiation Oncology

## 2019-12-03 ENCOUNTER — Ambulatory Visit: Payer: Medicare Other | Admitting: Rehabilitation

## 2019-12-03 ENCOUNTER — Other Ambulatory Visit: Payer: Self-pay

## 2019-12-03 DIAGNOSIS — Z510 Encounter for antineoplastic radiation therapy: Secondary | ICD-10-CM | POA: Diagnosis not present

## 2019-12-04 ENCOUNTER — Encounter (HOSPITAL_COMMUNITY): Payer: Self-pay | Admitting: General Surgery

## 2019-12-04 ENCOUNTER — Inpatient Hospital Stay (HOSPITAL_COMMUNITY): Payer: Medicare Other | Admitting: Physician Assistant

## 2019-12-04 ENCOUNTER — Ambulatory Visit: Payer: Medicare Other

## 2019-12-04 ENCOUNTER — Inpatient Hospital Stay (HOSPITAL_COMMUNITY): Payer: Medicare Other | Admitting: Certified Registered"

## 2019-12-04 ENCOUNTER — Ambulatory Visit (HOSPITAL_COMMUNITY)
Admission: RE | Admit: 2019-12-04 | Discharge: 2019-12-04 | Disposition: A | Discharge status: Home / Self Care | Payer: Medicare Other | Attending: General Surgery | Admitting: General Surgery

## 2019-12-04 ENCOUNTER — Encounter (HOSPITAL_COMMUNITY)
Admission: RE | Disposition: A | Discharge status: Home / Self Care | Payer: Self-pay | Source: Home / Self Care | Attending: General Surgery

## 2019-12-04 DIAGNOSIS — F17200 Nicotine dependence, unspecified, uncomplicated: Secondary | ICD-10-CM | POA: Insufficient documentation

## 2019-12-04 DIAGNOSIS — Z20822 Contact with and (suspected) exposure to covid-19: Secondary | ICD-10-CM | POA: Diagnosis not present

## 2019-12-04 DIAGNOSIS — C140 Malignant neoplasm of pharynx, unspecified: Secondary | ICD-10-CM | POA: Insufficient documentation

## 2019-12-04 HISTORY — PX: GASTROSTOMY: SHX5249

## 2019-12-04 LAB — CBC
HCT: 38 % (ref 36.0–46.0)
Hemoglobin: 13.1 g/dL (ref 12.0–15.0)
MCH: 28.9 pg (ref 26.0–34.0)
MCHC: 34.5 g/dL (ref 30.0–36.0)
MCV: 83.7 fL (ref 80.0–100.0)
Platelets: 205 10*3/uL (ref 150–400)
RBC: 4.54 MIL/uL (ref 3.87–5.11)
RDW: 13.7 % (ref 11.5–15.5)
WBC: 3.4 10*3/uL — ABNORMAL LOW (ref 4.0–10.5)
nRBC: 0 % (ref 0.0–0.2)

## 2019-12-04 LAB — BASIC METABOLIC PANEL
Anion gap: 7 (ref 5–15)
BUN: 8 mg/dL (ref 8–23)
CO2: 27 mmol/L (ref 22–32)
Calcium: 7.4 mg/dL — ABNORMAL LOW (ref 8.9–10.3)
Chloride: 96 mmol/L — ABNORMAL LOW (ref 98–111)
Creatinine, Ser: 0.56 mg/dL (ref 0.44–1.00)
GFR calc Af Amer: >60 mL/min (ref >60)
GFR calc non Af Amer: >60 mL/min (ref >60)
Glucose, Bld: 109 mg/dL — ABNORMAL HIGH (ref 70–99)
Potassium: 3.3 mmol/L — ABNORMAL LOW (ref 3.5–5.1)
Sodium: 130 mmol/L — ABNORMAL LOW (ref 135–145)

## 2019-12-04 LAB — SARS CORONAVIRUS 2 BY RT PCR (HOSPITAL ORDER, PERFORMED IN ~~LOC~~ HOSPITAL LAB): SARS Coronavirus 2: NEGATIVE

## 2019-12-04 SURGERY — INSERTION OF GASTROSTOMY TUBE
Anesthesia: General

## 2019-12-04 MED ORDER — ONDANSETRON HCL 4 MG/2ML IJ SOLN
INTRAMUSCULAR | Status: DC | PRN
  Administered 2019-12-04: 4 mg via INTRAVENOUS

## 2019-12-04 MED ORDER — FENTANYL CITRATE (PF) 100 MCG/2ML IJ SOLN
INTRAMUSCULAR | Status: AC
  Filled 2019-12-04: qty 2

## 2019-12-04 MED ORDER — MEPERIDINE HCL 50 MG/ML IJ SOLN: 6.2500 mg | INTRAMUSCULAR | Status: DC | PRN

## 2019-12-04 MED ORDER — BUPIVACAINE HCL 0.25 % IJ SOLN
INTRAMUSCULAR | Status: DC | PRN
  Administered 2019-12-04: 15 mL

## 2019-12-04 MED ORDER — PHENYLEPHRINE 40 MCG/ML (10ML) SYRINGE FOR IV PUSH (FOR BLOOD PRESSURE SUPPORT)
PREFILLED_SYRINGE | INTRAVENOUS | Status: DC | PRN
  Administered 2019-12-04: 60 ug via INTRAVENOUS
  Administered 2019-12-04 (×2): 40 ug via INTRAVENOUS

## 2019-12-04 MED ORDER — LACTATED RINGERS IV SOLN: INTRAVENOUS | Status: DC

## 2019-12-04 MED ORDER — CEFAZOLIN SODIUM-DEXTROSE 2-4 GM/100ML-% IV SOLN
2.0000 g | INTRAVENOUS | Status: AC
  Administered 2019-12-04: 2 g via INTRAVENOUS
  Filled 2019-12-04: qty 100

## 2019-12-04 MED ORDER — CHLORHEXIDINE GLUCONATE CLOTH 2 % EX PADS: 6.0000 | MEDICATED_PAD | Freq: Once | CUTANEOUS | Status: DC

## 2019-12-04 MED ORDER — LABETALOL HCL 5 MG/ML IV SOLN
INTRAVENOUS | Status: DC | PRN
  Administered 2019-12-04 (×4): 5 mg via INTRAVENOUS

## 2019-12-04 MED ORDER — ACETAMINOPHEN 500 MG PO TABS
1000.0000 mg | ORAL_TABLET | ORAL | Status: DC
  Filled 2019-12-04: qty 2

## 2019-12-04 MED ORDER — ACETAMINOPHEN 10 MG/ML IV SOLN
1000.0000 mg | Freq: Once | INTRAVENOUS | Status: DC | PRN
  Administered 2019-12-04: 1000 mg via INTRAVENOUS

## 2019-12-04 MED ORDER — HYDROMORPHONE HCL 1 MG/ML IJ SOLN
0.2500 mg | INTRAMUSCULAR | Status: DC | PRN
  Administered 2019-12-04 (×2): 0.5 mg via INTRAVENOUS

## 2019-12-04 MED ORDER — FENTANYL CITRATE (PF) 250 MCG/5ML IJ SOLN
INTRAMUSCULAR | Status: DC | PRN
Start: 2019-12-04 — End: 2019-12-04
  Administered 2019-12-04 (×2): 50 ug via INTRAVENOUS

## 2019-12-04 MED ORDER — PROMETHAZINE HCL 25 MG/ML IJ SOLN
6.2500 mg | INTRAMUSCULAR | Status: DC | PRN
  Administered 2019-12-04: 6.25 mg via INTRAVENOUS

## 2019-12-04 MED ORDER — PROPOFOL 10 MG/ML IV BOLUS
INTRAVENOUS | Status: DC | PRN
  Administered 2019-12-04: 150 mg via INTRAVENOUS

## 2019-12-04 MED ORDER — PHENYLEPHRINE 40 MCG/ML (10ML) SYRINGE FOR IV PUSH (FOR BLOOD PRESSURE SUPPORT)
PREFILLED_SYRINGE | INTRAVENOUS | Status: AC
  Filled 2019-12-04: qty 10

## 2019-12-04 MED ORDER — MIDAZOLAM HCL 2 MG/2ML IJ SOLN
INTRAMUSCULAR | Status: AC
  Filled 2019-12-04: qty 2

## 2019-12-04 MED ORDER — BUPIVACAINE HCL 0.25 % IJ SOLN
INTRAMUSCULAR | Status: AC
  Filled 2019-12-04: qty 1

## 2019-12-04 MED ORDER — ONDANSETRON HCL 4 MG/2ML IJ SOLN
INTRAMUSCULAR | Status: AC
  Filled 2019-12-04: qty 2

## 2019-12-04 MED ORDER — HYDROCODONE-ACETAMINOPHEN 7.5-325 MG/15ML PO SOLN: 15.0000 mL | Freq: Four times a day (QID) | ORAL | 0 refills | Status: DC | PRN

## 2019-12-04 MED ORDER — HYDROMORPHONE HCL 1 MG/ML IJ SOLN
INTRAMUSCULAR | Status: DC
Start: 2019-12-04 — End: 2019-12-04
  Filled 2019-12-04: qty 1

## 2019-12-04 MED ORDER — ORAL CARE MOUTH RINSE: 15.0000 mL | Freq: Once | OROMUCOSAL | Status: AC

## 2019-12-04 MED ORDER — SUCCINYLCHOLINE CHLORIDE 200 MG/10ML IV SOSY
PREFILLED_SYRINGE | INTRAVENOUS | Status: DC | PRN
  Administered 2019-12-04: 100 mg via INTRAVENOUS

## 2019-12-04 MED ORDER — LIDOCAINE 2% (20 MG/ML) 5 ML SYRINGE
INTRAMUSCULAR | Status: DC | PRN
  Administered 2019-12-04: 40 mg via INTRAVENOUS

## 2019-12-04 MED ORDER — CHLORHEXIDINE GLUCONATE 0.12 % MT SOLN
15.0000 mL | Freq: Once | OROMUCOSAL | Status: AC
  Administered 2019-12-04: 15 mL via OROMUCOSAL

## 2019-12-04 MED ORDER — PROPOFOL 10 MG/ML IV BOLUS
INTRAVENOUS | Status: AC
  Filled 2019-12-04: qty 40

## 2019-12-04 MED ORDER — SUGAMMADEX SODIUM 200 MG/2ML IV SOLN
INTRAVENOUS | Status: DC | PRN
  Administered 2019-12-04: 150 mg via INTRAVENOUS

## 2019-12-04 MED ORDER — ENSURE PRE-SURGERY PO LIQD
296.0000 mL | Freq: Once | ORAL | Status: DC
  Filled 2019-12-04: qty 296

## 2019-12-04 MED ORDER — HYDROMORPHONE HCL 1 MG/ML IJ SOLN
INTRAMUSCULAR | Status: AC
  Filled 2019-12-04: qty 1

## 2019-12-04 MED ORDER — SUCCINYLCHOLINE CHLORIDE 200 MG/10ML IV SOSY
PREFILLED_SYRINGE | INTRAVENOUS | Status: AC
  Filled 2019-12-04: qty 10

## 2019-12-04 MED ORDER — ESMOLOL HCL 100 MG/10ML IV SOLN
INTRAVENOUS | Status: DC | PRN
  Administered 2019-12-04: 70 mg via INTRAVENOUS
  Administered 2019-12-04: 30 mg via INTRAVENOUS

## 2019-12-04 MED ORDER — ACETAMINOPHEN 10 MG/ML IV SOLN
INTRAVENOUS | Status: AC
  Filled 2019-12-04: qty 100

## 2019-12-04 MED ORDER — STERILE WATER FOR INJECTION IJ SOLN
INTRAMUSCULAR | Status: DC | PRN
  Administered 2019-12-04: 10 mL

## 2019-12-04 MED ORDER — PROMETHAZINE HCL 25 MG/ML IJ SOLN
INTRAMUSCULAR | Status: AC
  Filled 2019-12-04: qty 1

## 2019-12-04 MED ORDER — MIDAZOLAM HCL 2 MG/2ML IJ SOLN
INTRAMUSCULAR | Status: DC | PRN
  Administered 2019-12-04 (×2): 1 mg via INTRAVENOUS

## 2019-12-04 MED ORDER — LIDOCAINE 2% (20 MG/ML) 5 ML SYRINGE
INTRAMUSCULAR | Status: AC
  Filled 2019-12-04: qty 5

## 2019-12-04 MED ORDER — DEXAMETHASONE SODIUM PHOSPHATE 10 MG/ML IJ SOLN
INTRAMUSCULAR | Status: DC | PRN
  Administered 2019-12-04: 8 mg via INTRAVENOUS

## 2019-12-04 MED ORDER — PHENYLEPHRINE HCL (PRESSORS) 10 MG/ML IV SOLN
INTRAVENOUS | Status: AC
  Filled 2019-12-04: qty 1

## 2019-12-04 MED ORDER — DEXAMETHASONE SODIUM PHOSPHATE 10 MG/ML IJ SOLN
INTRAMUSCULAR | Status: AC
  Filled 2019-12-04: qty 1

## 2019-12-04 MED ORDER — 0.9 % SODIUM CHLORIDE (POUR BTL) OPTIME
TOPICAL | Status: DC | PRN
  Administered 2019-12-04: 1000 mL

## 2019-12-04 MED ORDER — ROCURONIUM BROMIDE 10 MG/ML (PF) SYRINGE
PREFILLED_SYRINGE | INTRAVENOUS | Status: DC | PRN
  Administered 2019-12-04: 40 mg via INTRAVENOUS

## 2019-12-04 SURGICAL SUPPLY — 40 items
ADH SKN CLS APL DERMABOND .7 (GAUZE/BANDAGES/DRESSINGS) ×1
APL PRP STRL LF DISP 70% ISPRP (MISCELLANEOUS) ×1
APL SKNCLS STERI-STRIP NONHPOA (GAUZE/BANDAGES/DRESSINGS)
BENZOIN TINCTURE PRP APPL 2/3 (GAUZE/BANDAGES/DRESSINGS) IMPLANT
BNDG ADH 1X3 SHEER STRL LF (GAUZE/BANDAGES/DRESSINGS) ×6 IMPLANT
BNDG ADH THN 3X1 STRL LF (GAUZE/BANDAGES/DRESSINGS) ×2
CABLE HIGH FREQUENCY MONO STRZ (ELECTRODE) IMPLANT
CATH BOLUS GASTRO 22FR (CATHETERS) ×3 IMPLANT
CATH GASTROSTOMY 24FR (CATHETERS) IMPLANT
CHLORAPREP W/TINT 26 (MISCELLANEOUS) ×3 IMPLANT
CLOSURE WOUND 1/2 X4 (GAUZE/BANDAGES/DRESSINGS)
COVER SURGICAL LIGHT HANDLE (MISCELLANEOUS) ×3 IMPLANT
COVER WAND RF STERILE (DRAPES) IMPLANT
DECANTER SPIKE VIAL GLASS SM (MISCELLANEOUS) ×3 IMPLANT
DERMABOND ADVANCED (GAUZE/BANDAGES/DRESSINGS) ×2
DERMABOND ADVANCED .7 DNX12 (GAUZE/BANDAGES/DRESSINGS) ×1 IMPLANT
GLOVE BIOGEL PI IND STRL 7.0 (GLOVE) ×1 IMPLANT
GLOVE BIOGEL PI INDICATOR 7.0 (GLOVE) ×2
GLOVE SURG SS PI 7.0 STRL IVOR (GLOVE) ×3 IMPLANT
GOWN STRL REUS W/TWL LRG LVL3 (GOWN DISPOSABLE) ×3 IMPLANT
GOWN STRL REUS W/TWL XL LVL3 (GOWN DISPOSABLE) ×6 IMPLANT
KIT BASIN OR (CUSTOM PROCEDURE TRAY) ×3 IMPLANT
KIT INTRODUCER MIC G-18 (SET/KITS/TRAYS/PACK) ×3 IMPLANT
KIT TURNOVER KIT A (KITS) IMPLANT
LUBRICANT JELLY K Y 4OZ (MISCELLANEOUS) IMPLANT
NEEDLE INSUFFLATION 14GA 150MM (NEEDLE) ×3 IMPLANT
PENCIL ELECTRO RS 15 CORD (MISCELLANEOUS) ×3 IMPLANT
PENCIL SMOKE EVACUATOR (MISCELLANEOUS) IMPLANT
SCISSORS LAP 5X35 DISP (ENDOMECHANICALS) ×3 IMPLANT
SET IRRIG TUBING LAPAROSCOPIC (IRRIGATION / IRRIGATOR) IMPLANT
SET IRRIG Y TYPE TUR BLADDER L (SET/KITS/TRAYS/PACK) IMPLANT
SET TUBE SMOKE EVAC HIGH FLOW (TUBING) ×3 IMPLANT
SLEEVE XCEL OPT CAN 5 100 (ENDOMECHANICALS) ×3 IMPLANT
STRIP CLOSURE SKIN 1/2X4 (GAUZE/BANDAGES/DRESSINGS) IMPLANT
SUT ETHILON 2 0 PS N (SUTURE) IMPLANT
SUT MNCRL AB 4-0 PS2 18 (SUTURE) ×3 IMPLANT
TOWEL OR 17X26 10 PK STRL BLUE (TOWEL DISPOSABLE) ×3 IMPLANT
TOWEL OR NON WOVEN STRL DISP B (DISPOSABLE) IMPLANT
TRAY LAPAROSCOPIC (CUSTOM PROCEDURE TRAY) ×3 IMPLANT
TROCAR BLADELESS OPT 5 100 (ENDOMECHANICALS) ×3 IMPLANT

## 2019-12-04 NOTE — Anesthesia Preprocedure Evaluation (Signed)
Anesthesia Evaluation  Patient identified by MRN, date of birth, ID band Patient awake    Reviewed: Allergy & Precautions, H&P , NPO status , Patient's Chart, lab work & pertinent test results  Airway Mallampati: II  TM Distance: >3 FB Neck ROM: Full    Dental no notable dental hx. (+) Edentulous Upper, Edentulous Lower, Dental Advisory Given   Pulmonary Current Smoker and Patient abstained from smoking.,    Pulmonary exam normal breath sounds clear to auscultation       Cardiovascular Exercise Tolerance: Good negative cardio ROS Normal cardiovascular exam Rhythm:Regular Rate:Normal     Neuro/Psych negative neurological ROS  negative psych ROS   GI/Hepatic Neg liver ROS,   Endo/Other  negative endocrine ROS  Renal/GU negative Renal ROS  negative genitourinary   Musculoskeletal negative musculoskeletal ROS (+)   Abdominal Normal abdominal exam  (+)   Peds  Hematology negative hematology ROS (+)   Anesthesia Other Findings   Reproductive/Obstetrics negative OB ROS                             Anesthesia Physical  Anesthesia Plan  ASA: II  Anesthesia Plan: General   Post-op Pain Management:    Induction: Intravenous  PONV Risk Score and Plan: 4 or greater and Ondansetron, Dexamethasone and Midazolam  Airway Management Planned: Oral ETT  Additional Equipment: None  Intra-op Plan:   Post-operative Plan: Extubation in OR  Informed Consent: I have reviewed the patients History and Physical, chart, labs and discussed the procedure including the risks, benefits and alternatives for the proposed anesthesia with the patient or authorized representative who has indicated his/her understanding and acceptance.     Dental advisory given  Plan Discussed with: CRNA  Anesthesia Plan Comments:         Anesthesia Quick Evaluation

## 2019-12-04 NOTE — Discharge Instructions (Signed)
Gastrostomy Tube Replacement, Care After This sheet gives you information about how to care for yourself after your procedure. Your health care provider may also give you more specific instructions. If you have problems or questions, contact your health care provider. What can I expect after the procedure? After the procedure, it is common to have:  Mild pain in your abdomen.  A small amount of blood-tinged fluid leaking from the replacement site. Follow these instructions at home:   You may resume your normal level of activity.  You may resume your normal feedings.  Wash your hands before and after caring for your gastrostomy tube.  Check the skin around the tube site insertion for redness, a rash, swelling, drainage, or extra tissue growth. If you notice any of these, call your health care provider.  Care for your gastrostomy tube as you did before, or as directed by your health care provider. Contact a health care provider if:  You have a fever or chills.  You have redness or irritation near the insertion site.  You continue to have pain in the abdomen or leaking around your gastrostomy tube. Get help right away if:  You develop bleeding or a lot of discharge around the tube.  You have severe pain in the abdomen.  Your new tube is not working properly.  You are unable to get feedings into the tube.  Your tube comes out for any reason. Summary  Follow specific instructions as told by your health care provider. If you have problems or questions, contact your health care provider.  After the procedure you may resume your normal level of activity and normal feedings.  Care for your gastrostomy tube as you did before, or as directed by your health care provider. This information is not intended to replace advice given to you by your health care provider. Make sure you discuss any questions you have with your health care provider. Document Revised: 05/03/2017 Document  Reviewed: 05/03/2017 Elsevier Patient Education  2020 [place] Anesthesia, Adult, Care After This sheet gives you information about how to care for yourself after your procedure. Your health care provider may also give you more specific instructions. If you have problems or questions, contact your health care provider. What can I expect after the procedure? After the procedure, the following side effects are common:  Pain or discomfort at the IV site.  Nausea.  Vomiting.  Sore throat.  Trouble concentrating.  Feeling cold or chills.  Weak or tired.  Sleepiness and fatigue.  Soreness and body aches. These side effects can affect parts of the body that were not involved in surgery. Follow these instructions at home:  For at least 24 hours after the procedure:  Have a responsible adult stay with you. It is important to have someone help care for you until you are awake and alert.  Rest as needed.  Do not: ? Participate in activities in which you could fall or become injured. ? Drive. ? Use heavy machinery. ? Drink alcohol. ? Take sleeping pills or medicines that cause drowsiness. ? Make important decisions or sign legal documents. ? Take care of children on your own. Eating and drinking  Follow any instructions from your health care provider about eating or drinking restrictions.  When you feel hungry, start by eating small amounts of foods that are soft and easy to digest (bland), such as toast. Gradually return to your regular diet.  Drink enough fluid to keep your urine pale  yellow.  If you vomit, rehydrate by drinking water, juice, or clear broth. General instructions  If you have sleep apnea, surgery and certain medicines can increase your risk for breathing problems. Follow instructions from your health care provider about wearing your sleep device: ? Anytime you are sleeping, including during daytime naps. ? While taking prescription pain  medicines, sleeping medicines, or medicines that make you drowsy.  Return to your normal activities as told by your health care provider. Ask your health care provider what activities are safe for you.  Take over-the-counter and prescription medicines only as told by your health care provider.  If you smoke, do not smoke without supervision.  Keep all follow-up visits as told by your health care provider. This is important. Contact a health care provider if:  You have nausea or vomiting that does not get better with medicine.  You cannot eat or drink without vomiting.  You have pain that does not get better with medicine.  You are unable to pass urine.  You develop a skin rash.  You have a fever.  You have redness around your IV site that gets worse. Get help right away if:  You have difficulty breathing.  You have chest pain.  You have blood in your urine or stool, or you vomit blood. Summary  After the procedure, it is common to have a sore throat or nausea. It is also common to feel tired.  Have a responsible adult stay with you for the first 24 hours after general anesthesia. It is important to have someone help care for you until you are awake and alert.  When you feel hungry, start by eating small amounts of foods that are soft and easy to digest (bland), such as toast. Gradually return to your regular diet.  Drink enough fluid to keep your urine pale yellow.  Return to your normal activities as told by your health care provider. Ask your health care provider what activities are safe for you. This information is not intended to replace advice given to you by your health care provider. Make sure you discuss any questions you have with your health care provider. Document Revised: 03/31/2017 Document Reviewed: 11/11/2016 Elsevier Patient Education  [place].

## 2019-12-04 NOTE — Anesthesia Postprocedure Evaluation (Signed)
Anesthesia Post Note  Patient: Tina Kennedy  Procedure(s) Performed: LAPAROSCOPIC G TUBE PLACEMENT (N/A )     Patient location during evaluation: PACU Anesthesia Type: General Level of consciousness: awake and sedated Pain management: pain level controlled Vital Signs Assessment: post-procedure vital signs reviewed and stable Respiratory status: spontaneous breathing Cardiovascular status: stable Postop Assessment: no apparent nausea or vomiting Anesthetic complications: no   No complications documented.  Last Vitals:  Vitals:   12/04/19 0915 12/04/19 0923  BP: (!) 170/87   Pulse: 63 65  Resp: (!) 7 11  Temp: (!) 36.2 C   SpO2: 100% 97%    Last Pain:  Vitals:   12/04/19 0923  TempSrc:   PainSc: Teller Jr

## 2019-12-04 NOTE — Anesthesia Procedure Notes (Addendum)
Procedure Name: Intubation Date/Time: 12/04/2019 7:47 AM Performed by: Eben Burow, CRNA Pre-anesthesia Checklist: Patient identified, Emergency Drugs available, Suction available, Patient being monitored and Timeout performed Patient Re-evaluated:Patient Re-evaluated prior to induction Oxygen Delivery Method: Circle system utilized Preoxygenation: Pre-oxygenation with 100% oxygen Induction Type: IV induction Ventilation: Mask ventilation without difficulty and Oral airway inserted - appropriate to patient size Laryngoscope Size: Glidescope and 4 Grade View: Grade I Tube type: Oral Tube size: 7.0 mm Number of attempts: 2 Airway Equipment and Method: Stylet Placement Confirmation: ETT inserted through vocal cords under direct vision,  positive ETCO2 and breath sounds checked- equal and bilateral Secured at: 22 cm Tube secured with: Tape Dental Injury: Teeth and Oropharynx as per pre-operative assessment  Comments: IV induction, able to ventilate with oral airway in place, DVL x 1 with MAC 4, large pharygeal mass which is friable, ETT placed with no EtCO2, ETT removed, Glidescope used x 1, large mass noted, able to visualize vocal cords and place ETT, anatomy distorted and difficult to identify.  Dr Jillyn Hidden present and Dr Rosendo Gros aware.

## 2019-12-04 NOTE — Interval H&P Note (Signed)
History and Physical Interval Note:  12/04/2019 7:15 AM  Tina Kennedy  has presented today for surgery, with the diagnosis of PHAARYNGEAL CARCINOMA.  The various methods of treatment have been discussed with the patient and family. After consideration of risks, benefits and other options for treatment, the patient has consented to  Procedure(s): LAPAROSCOPIC G TUBE PLACEMENT (N/A) as a surgical intervention.  The patient's history has been reviewed, patient examined, no change in status, stable for surgery.  I have reviewed the patient's chart and labs.  Questions were answered to the patient's satisfaction.     Ralene Ok

## 2019-12-04 NOTE — Op Note (Signed)
12/04/2019  8:41 AM  PATIENT:  Tina Kennedy  69 y.o. female  PRE-OPERATIVE DIAGNOSIS:  PHARYNGEAL CARCINOMA  POST-OPERATIVE DIAGNOSIS:  PHARYNGEAL CARCINOMA  PROCEDURE:  Procedure(s): LAPAROSCOPIC G TUBE PLACEMENT (N/A)  SURGEON:  Surgeon(s) and Role:    * Ralene Ok, MD - Primary  ANESTHESIA:   local and general  EBL:  minimal   BLOOD ADMINISTERED:none  DRAINS: none   LOCAL MEDICATIONS USED:  BUPIVICAINE   SPECIMEN:  No Specimen  DISPOSITION OF SPECIMEN:  N/A  COUNTS:  YES  TOURNIQUET:  * No tourniquets in log *  DICTATION: .Dragon Dictation After patient was consented she was taken back to the operating room and placed in the supine position bilateral SCDs in place.  She was placed under general endotracheal intubation.  Patient was prepped and draped in sterile fashion.  Timeout was called all facts verified.   A verress needle was used to insufflate the abdomen to 62mm of mercury.  This followed by a 50mm trocar and camera in the right upper quadrant area.  There was no injury to any intraabdominal organ.    The stomach was easily visualized.  A second 8mm trocar was than placed in the left lower quadrant under direct visualization.  At this time the area of the gastrotomy skin site was chosen at the greater curvature of the stomach.  At this time the a puncture site was made on the skin.  A gastrotomy was created.  The 65 French G-tube was then placed into the stomach.  T-bar anchors were then placed.  At this time the balloon was filled.  This was brought to the intra-abdominal wall.  This was secured to the skin using 2-0 nylon's interrupted fashion.    The insuflation was evacuated and all trocars were removed.  The trocar sites were than closed with 4-0 monocryl.  The skin was dressed with steristrips and guaze and tape.   PLAN OF CARE: Discharge to home after PACU  PATIENT DISPOSITION:  PACU - hemodynamically stable.   Delay start of Pharmacological VTE  agent (>24hrs) due to surgical blood loss or risk of bleeding: not applicable

## 2019-12-04 NOTE — Progress Notes (Signed)
Oncology Nurse Navigator Documentation  I called Tina Kennedy to ask how she was doing after her PEG placement today in surgery. She told me that she was doing ok, but very sore. I asked if she would be able to come for her radiation treatment today at 3:45 and she felt like she was unable to come today due to the soreness. She verbalized that she will come tomorrow as scheduled. I have notified the [facility], transportation, and Dr. Isidore Moos.   Harlow Asa RN, BSN, OCN Head & Neck Oncology Nurse [place] [facility] Phone # [phone]  Fax # [phone]

## 2019-12-04 NOTE — Transfer of Care (Signed)
Immediate Anesthesia Transfer of Care Note  Patient: Shevaun Belfield  Procedure(s) Performed: LAPAROSCOPIC G TUBE PLACEMENT (N/A )  Patient Location: PACU  Anesthesia Type:General  Level of Consciousness: awake, alert  and oriented  Airway & Oxygen Therapy: Patient Spontanous Breathing and Patient connected to face mask oxygen  Post-op Assessment: Report given to RN and Post -op Vital signs reviewed and stable  Post vital signs: Reviewed and stable  Last Vitals:  Vitals Value Taken Time  BP 156/89 12/04/19 0856  Temp    Pulse 64 12/04/19 0858  Resp 11 12/04/19 0859  SpO2 100 % 12/04/19 0858  Vitals shown include unvalidated device data.  Last Pain:  Vitals:   12/04/19 0601  TempSrc: Oral         Complications: No complications documented.

## 2019-12-05 ENCOUNTER — Other Ambulatory Visit: Payer: Self-pay

## 2019-12-05 ENCOUNTER — Encounter (HOSPITAL_COMMUNITY): Payer: Self-pay | Admitting: General Surgery

## 2019-12-05 ENCOUNTER — Ambulatory Visit
Admission: RE | Admit: 2019-12-05 | Discharge: 2019-12-05 | Disposition: A | Discharge status: Home / Self Care | Payer: Medicare Other | Source: Ambulatory Visit | Attending: Radiation Oncology | Admitting: Radiation Oncology

## 2019-12-05 DIAGNOSIS — Z510 Encounter for antineoplastic radiation therapy: Secondary | ICD-10-CM | POA: Diagnosis not present

## 2019-12-05 NOTE — Progress Notes (Signed)
Oncology Nurse Navigator Documentation  I was contacted by [facility], Tina Kennedy requested some help with her PEG tube that was placed yesterday. She had a bulky dressing in place with dried blood present. I demonstrated how to change her dressing daily. A new dressing was applied.  I then reinforced my previous education on how to flush her PEG tube daily. Her PEG was placed in surgery, and there was not a clamp present. I demonstrated how to clamp it with her hand and insert the syringe into the PEG to flush the water. She demonstrated the procedure back to me without difficulty. When she returns for radiation tomorrow, she will let me know if she needs any further assistance.  Tina Asa RN, BSN, OCN Head & Neck Oncology Nurse [place] [facility] Phone # [phone]  Fax # [phone]

## 2019-12-06 ENCOUNTER — Inpatient Hospital Stay: Payer: Medicare Other

## 2019-12-06 ENCOUNTER — Ambulatory Visit
Admission: RE | Admit: 2019-12-06 | Discharge: 2019-12-06 | Disposition: A | Discharge status: Home / Self Care | Payer: Medicare Other | Source: Ambulatory Visit | Attending: Radiation Oncology | Admitting: Radiation Oncology

## 2019-12-06 ENCOUNTER — Telehealth: Payer: Self-pay

## 2019-12-06 ENCOUNTER — Other Ambulatory Visit: Payer: Self-pay

## 2019-12-06 ENCOUNTER — Inpatient Hospital Stay: Payer: Medicare Other | Admitting: Oncology

## 2019-12-06 VITALS — BP 152/64 | HR 68 | Temp 97.1°F | Resp 20 | Wt 108.4 lb

## 2019-12-06 DIAGNOSIS — C139 Malignant neoplasm of hypopharynx, unspecified: Secondary | ICD-10-CM

## 2019-12-06 DIAGNOSIS — Z5111 Encounter for antineoplastic chemotherapy: Secondary | ICD-10-CM | POA: Diagnosis not present

## 2019-12-06 DIAGNOSIS — Z510 Encounter for antineoplastic radiation therapy: Secondary | ICD-10-CM | POA: Diagnosis not present

## 2019-12-06 DIAGNOSIS — C132 Malignant neoplasm of posterior wall of hypopharynx: Secondary | ICD-10-CM | POA: Diagnosis not present

## 2019-12-06 LAB — CBC WITH DIFFERENTIAL (CANCER CENTER ONLY)
Abs Immature Granulocytes: 0.01 10*3/uL (ref 0.00–0.07)
Basophils Absolute: 0 10*3/uL (ref 0.0–0.1)
Basophils Relative: 0 %
Eosinophils Absolute: 0 10*3/uL (ref 0.0–0.5)
Eosinophils Relative: 0 %
HCT: 36.4 % (ref 36.0–46.0)
Hemoglobin: 12.9 g/dL (ref 12.0–15.0)
Immature Granulocytes: 0 %
Lymphocytes Relative: 10 %
Lymphs Abs: 0.4 10*3/uL — ABNORMAL LOW (ref 0.7–4.0)
MCH: 28.9 pg (ref 26.0–34.0)
MCHC: 35.4 g/dL (ref 30.0–36.0)
MCV: 81.4 fL (ref 80.0–100.0)
Monocytes Absolute: 0.4 10*3/uL (ref 0.1–1.0)
Monocytes Relative: 10 %
Neutro Abs: 3 10*3/uL (ref 1.7–7.7)
Neutrophils Relative %: 80 %
Platelet Count: 200 10*3/uL (ref 150–400)
RBC: 4.47 MIL/uL (ref 3.87–5.11)
RDW: 13.1 % (ref 11.5–15.5)
WBC Count: 3.8 10*3/uL — ABNORMAL LOW (ref 4.0–10.5)
nRBC: 0 % (ref 0.0–0.2)

## 2019-12-06 LAB — CMP (CANCER CENTER ONLY)
ALT: 12 U/L (ref 0–44)
AST: 25 U/L (ref 15–41)
Albumin: 2.4 g/dL — ABNORMAL LOW (ref 3.5–5.0)
Alkaline Phosphatase: 76 U/L (ref 38–126)
Anion gap: 4 — ABNORMAL LOW (ref 5–15)
BUN: 4 mg/dL — ABNORMAL LOW (ref 8–23)
CO2: 33 mmol/L — ABNORMAL HIGH (ref 22–32)
Calcium: 8.6 mg/dL — ABNORMAL LOW (ref 8.9–10.3)
Chloride: 96 mmol/L — ABNORMAL LOW (ref 98–111)
Creatinine: 0.61 mg/dL (ref 0.44–1.00)
GFR, Est AFR Am: >60 mL/min (ref >60)
GFR, Estimated: >60 mL/min (ref >60)
Glucose, Bld: 86 mg/dL (ref 70–99)
Potassium: 3.7 mmol/L (ref 3.5–5.1)
Sodium: 133 mmol/L — ABNORMAL LOW (ref 135–145)
Total Bilirubin: 0.5 mg/dL (ref 0.3–1.2)
Total Protein: 6.1 g/dL — ABNORMAL LOW (ref 6.5–8.1)

## 2019-12-06 LAB — MAGNESIUM: Magnesium: 1.3 mg/dL — CL (ref 1.7–2.4)

## 2019-12-06 MED ORDER — POTASSIUM CHLORIDE 2 MEQ/ML IV SOLN
Freq: Once | INTRAVENOUS | Status: AC
  Filled 2019-12-06: qty 10

## 2019-12-06 MED ORDER — SODIUM CHLORIDE 0.9 % IV SOLN
Freq: Once | INTRAVENOUS | Status: AC
  Filled 2019-12-06: qty 250

## 2019-12-06 MED ORDER — HEPARIN SOD (PORK) LOCK FLUSH 100 UNIT/ML IV SOLN
500.0000 [IU] | Freq: Once | INTRAVENOUS | Status: AC | PRN
  Administered 2019-12-06: 500 [IU]
  Filled 2019-12-06: qty 5

## 2019-12-06 MED ORDER — SODIUM CHLORIDE 0.9 % IV SOLN
30.0000 mg/m2 | Freq: Once | INTRAVENOUS | Status: AC
  Administered 2019-12-06: 45 mg via INTRAVENOUS
  Filled 2019-12-06: qty 45

## 2019-12-06 MED ORDER — PALONOSETRON HCL INJECTION 0.25 MG/5ML
0.2500 mg | Freq: Once | INTRAVENOUS | Status: AC
  Administered 2019-12-06: 0.25 mg via INTRAVENOUS

## 2019-12-06 MED ORDER — SODIUM CHLORIDE 0.9 % IV SOLN
10.0000 mg | Freq: Once | INTRAVENOUS | Status: AC
  Administered 2019-12-06: 10 mg via INTRAVENOUS
  Filled 2019-12-06: qty 10

## 2019-12-06 MED ORDER — SODIUM CHLORIDE 0.9 % IV SOLN
150.0000 mg | Freq: Once | INTRAVENOUS | Status: AC
  Administered 2019-12-06: 150 mg via INTRAVENOUS
  Filled 2019-12-06: qty 150

## 2019-12-06 MED ORDER — PALONOSETRON HCL INJECTION 0.25 MG/5ML
INTRAVENOUS | Status: AC
  Filled 2019-12-06: qty 5

## 2019-12-06 MED ORDER — SODIUM CHLORIDE 0.9% FLUSH
10.0000 mL | INTRAVENOUS | Status: DC | PRN
  Administered 2019-12-06: 10 mL
  Filled 2019-12-06: qty 10

## 2019-12-06 NOTE — Patient Instructions (Signed)
Cancer Center Discharge Instructions for Patients Receiving Chemotherapy  Today you received the following chemotherapy agents: Cisplatin  To help prevent nausea and vomiting after your treatment, we encourage you to take your nausea medication  as prescribed.    If you develop nausea and vomiting that is not controlled by your nausea medication, call the clinic.   BELOW ARE SYMPTOMS THAT SHOULD BE REPORTED IMMEDIATELY:  *FEVER GREATER THAN 100.5 F  *CHILLS WITH OR WITHOUT FEVER  NAUSEA AND VOMITING THAT IS NOT CONTROLLED WITH YOUR NAUSEA MEDICATION  *UNUSUAL SHORTNESS OF BREATH  *UNUSUAL BRUISING OR BLEEDING  TENDERNESS IN MOUTH AND THROAT WITH OR WITHOUT PRESENCE OF ULCERS  *URINARY PROBLEMS  *BOWEL PROBLEMS  UNUSUAL RASH Items with * indicate a potential emergency and should be followed up as soon as possible.  Feel free to call the clinic should you have any questions or concerns. The clinic phone number is [phone].  Please show the CHEMO ALERT CARD at check-in to the Emergency Department and triage nurse.   

## 2019-12-06 NOTE — Telephone Encounter (Signed)
CRITICAL VALUE STICKER  CRITICAL VALUE: Magnesium 1.3  RECEIVER: Wendall Mola, RN  DATE & TIME NOTIFIED: 12/06/2019 @ 1000  MESSENGER: Sharlynn Oliphant, RN  MD NOTIFIED: Dr. Zola Button  TIME OF NOTIFICATION: 1001  RESPONSE: Acknowledged. No further orders received.

## 2019-12-06 NOTE — Progress Notes (Signed)
Hematology and Oncology Follow Up Visit  Tina Kennedy [id number] October 18, 1950 69 y.o. 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     Prior Therapy:  She is status post direct laryngoscopy and biopsy completed on October 15, 2019.  Current therapy: Chemotherapy with radiation started in November 22, 2019.  She is currently receiving cisplatin 40 mg per metered square weekly and here for cycle 3 of therapy.  Interim History: Ms. Tina Kennedy presents today for return evaluation.  Since the last visit, she reports no major changes in her health.  She tolerated the first 2 cycles of cisplatin without any complaints.  She denies any nausea, vomiting or abdominal pain.  She denies any worsening neuropathy or excessive fatigue.  He does report some soreness in her mouth but overall she is able to eat and maintain hydration adequately.     Medications: Updated on review. Current Outpatient Medications  Medication Sig Dispense Refill  . HYDROcodone-acetaminophen (HYCET) 7.5-325 mg/15 ml solution Take 15 mLs by mouth 4 (four) times daily as needed for moderate pain. 120 mL 0  . lidocaine (XYLOCAINE) 2 % solution Patient: Mix 1part 2% viscous lidocaine, 1part H20. Swallow 30mL of diluted mixture, 68min before meals and at bedtime, up to QID (Patient not taking: Reported on 11/29/2019) 200 mL 3  . lidocaine-prilocaine (EMLA) cream Apply 1 application topically as needed. (Patient not taking: Reported on 11/12/2019) 30 g 0  . prochlorperazine (COMPAZINE) 10 MG tablet Take 1 tablet (10 mg total) by mouth every 6 (six) hours as needed for nausea or vomiting. 30 tablet 0   No current facility-administered medications for this visit.     Allergies: No Known Allergies    Physical Exam: Blood pressure (!) 152/64, pulse 68, temperature (!) 97.1 F (36.2 C), temperature  source Tympanic, resp. rate 20, weight 108 lb 6.4 oz (49.2 kg), SpO2 99 %.    ECOG: 1   General appearance: Alert, awake without any distress. Head: Atraumatic without abnormalities Oropharynx: Without any thrush or ulcers. Eyes: No scleral icterus. Lymph nodes: No lymphadenopathy noted in the cervical, supraclavicular, or axillary nodes Heart:regular rate and rhythm, without any murmurs or gallops.   Lung: Clear to auscultation without any rhonchi, wheezes or dullness to percussion. Abdomin: Soft, nontender without any shifting dullness or ascites. Musculoskeletal: No clubbing or cyanosis. Neurological: No motor or sensory deficits. Skin: No rashes or lesions.      Lab Results: Lab Results  Component Value Date   WBC 3.4 (L) 12/04/2019   HGB 13.1 12/04/2019   HCT 38.0 12/04/2019   MCV 83.7 12/04/2019   PLT 205 12/04/2019     Chemistry      Component Value Date/Time   NA 130 (L) 12/04/2019 0913   K 3.3 (L) 12/04/2019 0913   CL 96 (L) 12/04/2019 0913   CO2 27 12/04/2019 0913   BUN 8 12/04/2019 0913   CREATININE 0.56 12/04/2019 0913   CREATININE 0.58 11/29/2019 0807      Component Value Date/Time   CALCIUM 7.4 (L) 12/04/2019 0913   ALKPHOS 81 11/29/2019 0807   AST 23 11/29/2019 0807   ALT 11 11/29/2019 0807   BILITOT 0.6 11/29/2019 0807         Impression and Plan:   69 year old woman with:  1.  Stage III squamous cell carcinoma of the right hypopharynx diagnosed and July 2021.  She is currently receiving definitive therapy with radiation and weekly cisplatin.  Risks and benefits of continuing this treatment were reviewed at this time.  Complication include nausea, vomiting, myelosuppression, neutropenia, sepsis, renal sufficiency in addition to mucositis and poor nutrition.  She is agreeable to continue at this time.  I will reduce her cisplatin dose to 30 mg given her slight decline in her white cell count.  2.  IV access: Port-A-Cath remains in place  and in use without any issues.  3.  Nutrition: He is at risk of malnutrition with feeding tube in place at this time.   4.  Antiemetics: Compazine is available to her without any nausea or vomiting.  5.  Renal function surveillance: Kidney function remains stable and will continue to monitor on platinum based therapy.  6.  Prognosis and goals of care: Therapy remains curative and aggressive measures are warranted at this time.  7.  Follow-up: We'll continue to follow weekly for cisplatin chemotherapy daily for radiation.  30  minutes were dedicated to this visit.  The time was spent on reviewing her disease status, discussing treatment options and addressing complication related to therapy.   Zola Button, MD 8/27/20218:57 AM

## 2019-12-06 NOTE — Patient Instructions (Signed)

## 2019-12-06 NOTE — Progress Notes (Signed)
Per Dr. Alen Blew - okay to proceed with treatment with urine output of 100 ml.

## 2019-12-09 ENCOUNTER — Other Ambulatory Visit: Payer: Self-pay

## 2019-12-09 ENCOUNTER — Ambulatory Visit
Admission: RE | Admit: 2019-12-09 | Discharge: 2019-12-09 | Disposition: A | Discharge status: Home / Self Care | Payer: Medicare Other | Source: Ambulatory Visit | Attending: Radiation Oncology | Admitting: Radiation Oncology

## 2019-12-09 DIAGNOSIS — Z510 Encounter for antineoplastic radiation therapy: Secondary | ICD-10-CM | POA: Diagnosis not present

## 2019-12-10 ENCOUNTER — Ambulatory Visit
Admission: RE | Admit: 2019-12-10 | Discharge: 2019-12-10 | Disposition: A | Discharge status: Home / Self Care | Payer: Medicare Other | Source: Ambulatory Visit | Attending: Radiation Oncology | Admitting: Radiation Oncology

## 2019-12-10 ENCOUNTER — Inpatient Hospital Stay: Payer: Medicare Other | Admitting: Nutrition

## 2019-12-10 ENCOUNTER — Other Ambulatory Visit: Payer: Self-pay

## 2019-12-10 DIAGNOSIS — Z510 Encounter for antineoplastic radiation therapy: Secondary | ICD-10-CM | POA: Diagnosis not present

## 2019-12-10 MED ORDER — OSMOLITE 1.5 CAL PO LIQD: ORAL | 6 refills | Status: DC

## 2019-12-10 NOTE — Progress Notes (Signed)
Nutrition follow-up completed with patient after radiation therapy for hypopharyngeal cancer. Is pleased with weight gain.  Weight was documented as 111.4pounds on August 30 increased from 109.4 pounds August 23. Patient is experiencing some taste changes and thickened saliva. She reports she drinks 2 Ensure Enlive daily most days of the week. She is eating anything she wants including macaroni, greens, chicken and tuna. Reports no difficulty flushing her feeding tube.  Nutrition diagnosis: Inadequate oral intake improved.  Estimated nutrition needs: 1700-2000 cal, 75-100 g protein, 2.0 L fluid.  Intervention: Educated patient to continue strategies for eating foods and drinking liquids as desired. Continue Ensure Enlive by mouth twice daily. Encourage patient to continue strategies for reducing thick saliva. Reviewed tube feeding instructions with patient.  Patient will require 5 cartons of Osmolite 1.5 daily in 5 feedings approximately 3 hours apart.  Recommended 60 mL of free water before and after bolus feedings. 5 cartons of Osmolite 1.5 and water flushes provides 1775 cal, 74.5 g protein, 1505 mL free water. Patient did not want to demonstrate tube feeding today.  Provided written information and teach back method was used. Tube feeding orders were written and add [employer] notified.  Monitoring, evaluation, goals: Patient will tolerate adequate calories and protein to minimize weight loss throughout treatment.  Next visit: Wednesday, September 8 after radiation therapy.  **Disclaimer: This note was dictated with voice recognition software. Similar sounding words can inadvertently be transcribed and this note may contain transcription errors which may not have been corrected upon publication of note.**

## 2019-12-11 ENCOUNTER — Ambulatory Visit
Admission: RE | Admit: 2019-12-11 | Discharge: 2019-12-11 | Disposition: A | Discharge status: Home / Self Care | Payer: Medicare Other | Source: Ambulatory Visit | Attending: Radiation Oncology | Admitting: Radiation Oncology

## 2019-12-11 ENCOUNTER — Other Ambulatory Visit: Payer: Self-pay

## 2019-12-11 DIAGNOSIS — Z510 Encounter for antineoplastic radiation therapy: Secondary | ICD-10-CM | POA: Insufficient documentation

## 2019-12-11 DIAGNOSIS — Z79899 Other long term (current) drug therapy: Secondary | ICD-10-CM | POA: Diagnosis not present

## 2019-12-11 DIAGNOSIS — C139 Malignant neoplasm of hypopharynx, unspecified: Secondary | ICD-10-CM | POA: Insufficient documentation

## 2019-12-11 DIAGNOSIS — C770 Secondary and unspecified malignant neoplasm of lymph nodes of head, face and neck: Secondary | ICD-10-CM | POA: Diagnosis present

## 2019-12-12 ENCOUNTER — Other Ambulatory Visit: Payer: Self-pay

## 2019-12-12 ENCOUNTER — Ambulatory Visit
Admission: RE | Admit: 2019-12-12 | Discharge: 2019-12-12 | Disposition: A | Discharge status: Home / Self Care | Payer: Medicare Other | Source: Ambulatory Visit | Attending: Radiation Oncology | Admitting: Radiation Oncology

## 2019-12-12 DIAGNOSIS — C139 Malignant neoplasm of hypopharynx, unspecified: Secondary | ICD-10-CM | POA: Diagnosis not present

## 2019-12-13 ENCOUNTER — Other Ambulatory Visit: Payer: Self-pay | Admitting: Oncology

## 2019-12-13 ENCOUNTER — Ambulatory Visit
Admission: RE | Admit: 2019-12-13 | Discharge: 2019-12-13 | Disposition: A | Discharge status: Home / Self Care | Payer: Medicare Other | Source: Ambulatory Visit | Attending: Radiation Oncology | Admitting: Radiation Oncology

## 2019-12-13 ENCOUNTER — Inpatient Hospital Stay: Payer: Medicare Other | Admitting: General Practice

## 2019-12-13 ENCOUNTER — Other Ambulatory Visit: Payer: Self-pay

## 2019-12-13 ENCOUNTER — Inpatient Hospital Stay: Payer: Medicare Other

## 2019-12-13 ENCOUNTER — Inpatient Hospital Stay: Payer: Medicare Other | Attending: Oncology

## 2019-12-13 VITALS — BP 112/77 | HR 68 | Temp 98.4°F | Resp 18 | Wt 108.0 lb

## 2019-12-13 DIAGNOSIS — C139 Malignant neoplasm of hypopharynx, unspecified: Secondary | ICD-10-CM

## 2019-12-13 DIAGNOSIS — C132 Malignant neoplasm of posterior wall of hypopharynx: Secondary | ICD-10-CM

## 2019-12-13 DIAGNOSIS — Z5111 Encounter for antineoplastic chemotherapy: Secondary | ICD-10-CM | POA: Insufficient documentation

## 2019-12-13 LAB — CBC WITH DIFFERENTIAL (CANCER CENTER ONLY)
Abs Immature Granulocytes: 0.01 10*3/uL (ref 0.00–0.07)
Basophils Absolute: 0 10*3/uL (ref 0.0–0.1)
Basophils Relative: 1 %
Eosinophils Absolute: 0 10*3/uL (ref 0.0–0.5)
Eosinophils Relative: 1 %
HCT: 33.3 % — ABNORMAL LOW (ref 36.0–46.0)
Hemoglobin: 11.8 g/dL — ABNORMAL LOW (ref 12.0–15.0)
Immature Granulocytes: 1 %
Lymphocytes Relative: 13 %
Lymphs Abs: 0.3 10*3/uL — ABNORMAL LOW (ref 0.7–4.0)
MCH: 28.6 pg (ref 26.0–34.0)
MCHC: 35.4 g/dL (ref 30.0–36.0)
MCV: 80.6 fL (ref 80.0–100.0)
Monocytes Absolute: 0.3 10*3/uL (ref 0.1–1.0)
Monocytes Relative: 13 %
Neutro Abs: 1.4 10*3/uL — ABNORMAL LOW (ref 1.7–7.7)
Neutrophils Relative %: 71 %
Platelet Count: 191 10*3/uL (ref 150–400)
RBC: 4.13 MIL/uL (ref 3.87–5.11)
RDW: 13.1 % (ref 11.5–15.5)
WBC Count: 1.9 10*3/uL — ABNORMAL LOW (ref 4.0–10.5)
nRBC: 0 % (ref 0.0–0.2)

## 2019-12-13 LAB — CMP (CANCER CENTER ONLY)
ALT: 7 U/L (ref 0–44)
AST: 26 U/L (ref 15–41)
Albumin: 2.4 g/dL — ABNORMAL LOW (ref 3.5–5.0)
Alkaline Phosphatase: 76 U/L (ref 38–126)
Anion gap: 7 (ref 5–15)
BUN: 7 mg/dL — ABNORMAL LOW (ref 8–23)
CO2: 31 mmol/L (ref 22–32)
Calcium: 7.3 mg/dL — ABNORMAL LOW (ref 8.9–10.3)
Chloride: 94 mmol/L — ABNORMAL LOW (ref 98–111)
Creatinine: 0.73 mg/dL (ref 0.44–1.00)
GFR, Est AFR Am: >60 mL/min (ref >60)
GFR, Estimated: >60 mL/min (ref >60)
Glucose, Bld: 128 mg/dL — ABNORMAL HIGH (ref 70–99)
Potassium: 3.5 mmol/L (ref 3.5–5.1)
Sodium: 132 mmol/L — ABNORMAL LOW (ref 135–145)
Total Bilirubin: 0.7 mg/dL (ref 0.3–1.2)
Total Protein: 5.8 g/dL — ABNORMAL LOW (ref 6.5–8.1)

## 2019-12-13 MED ORDER — PALONOSETRON HCL INJECTION 0.25 MG/5ML
0.2500 mg | Freq: Once | INTRAVENOUS | Status: AC
  Administered 2019-12-13: 0.25 mg via INTRAVENOUS

## 2019-12-13 MED ORDER — SODIUM CHLORIDE 0.9 % IV SOLN
150.0000 mg | Freq: Once | INTRAVENOUS | Status: AC
  Administered 2019-12-13: 150 mg via INTRAVENOUS
  Filled 2019-12-13: qty 150

## 2019-12-13 MED ORDER — HYDROCODONE-ACETAMINOPHEN 7.5-325 MG/15ML PO SOLN
15.0000 mL | Freq: Four times a day (QID) | ORAL | 0 refills | Status: DC | PRN
Start: 2019-12-13 — End: 2020-01-10

## 2019-12-13 MED ORDER — SODIUM CHLORIDE 0.9 % IV SOLN
Freq: Once | INTRAVENOUS | Status: AC
  Filled 2019-12-13: qty 250

## 2019-12-13 MED ORDER — SODIUM CHLORIDE 0.9 % IV SOLN
10.0000 mg | Freq: Once | INTRAVENOUS | Status: AC
  Administered 2019-12-13: 10 mg via INTRAVENOUS
  Filled 2019-12-13: qty 10

## 2019-12-13 MED ORDER — SODIUM CHLORIDE 0.9% FLUSH
10.0000 mL | INTRAVENOUS | Status: DC | PRN
  Administered 2019-12-13: 10 mL
  Filled 2019-12-13: qty 10

## 2019-12-13 MED ORDER — SODIUM CHLORIDE 0.9 % IV SOLN
20.0000 mg/m2 | Freq: Once | INTRAVENOUS | Status: AC
  Administered 2019-12-13: 30 mg via INTRAVENOUS
  Filled 2019-12-13: qty 30

## 2019-12-13 MED ORDER — PALONOSETRON HCL INJECTION 0.25 MG/5ML
INTRAVENOUS | Status: AC
  Filled 2019-12-13: qty 5

## 2019-12-13 MED ORDER — POTASSIUM CHLORIDE 2 MEQ/ML IV SOLN
Freq: Once | INTRAVENOUS | Status: AC
  Filled 2019-12-13: qty 10

## 2019-12-13 MED ORDER — HEPARIN SOD (PORK) LOCK FLUSH 100 UNIT/ML IV SOLN
500.0000 [IU] | Freq: Once | INTRAVENOUS | Status: AC | PRN
  Administered 2019-12-13: 500 [IU]
  Filled 2019-12-13: qty 5

## 2019-12-13 MED ORDER — HYDROCODONE-ACETAMINOPHEN 7.5-325 MG/15ML PO SOLN
15.0000 mL | Freq: Four times a day (QID) | ORAL | 0 refills | Status: DC | PRN
Start: 2019-12-13 — End: 2019-12-13

## 2019-12-13 NOTE — Progress Notes (Signed)
[place] CSW Progress Notes  Met with patient in infusion.  She reports she is doing well overall.  Notes she has lost 2 pounds, states she knows how to use her feeding tube and is considering using it.  Discussed "things she can control" vs "things she can control" in context of nutrition.  She will consider starting to use her feeding tube.  She places trust in her faith resources and is confident that she will persevere through treatment.  Appreciates help w transportation to appointments w [facility] transport.  Other issues appear stable and she is making good progress in treatment.  Edwyna Shell, LCSW Clinical Social Worker Phone:  [phone]

## 2019-12-13 NOTE — Progress Notes (Signed)
Per Dr. Alen Blew, okay to treat with ANC 1.4.  Per Dr. Alen Blew, okay to proceed with treatment with 11ml urine output.

## 2019-12-13 NOTE — Patient Instructions (Signed)
[facility] Discharge Instructions for Patients Receiving Chemotherapy  Today you received the following chemotherapy agents Cisplatin  To help prevent nausea and vomiting after your treatment, we encourage you to take your nausea medication as directed  If you develop nausea and vomiting that is not controlled by your nausea medication, call the clinic.   BELOW ARE SYMPTOMS THAT SHOULD BE REPORTED IMMEDIATELY:  *FEVER GREATER THAN 100.5 F  *CHILLS WITH OR WITHOUT FEVER  NAUSEA AND VOMITING THAT IS NOT CONTROLLED WITH YOUR NAUSEA MEDICATION  *UNUSUAL SHORTNESS OF BREATH  *UNUSUAL BRUISING OR BLEEDING  TENDERNESS IN MOUTH AND THROAT WITH OR WITHOUT PRESENCE OF ULCERS  *URINARY PROBLEMS  *BOWEL PROBLEMS  UNUSUAL RASH Items with * indicate a potential emergency and should be followed up as soon as possible.  Feel free to call the clinic should you have any questions or concerns. The clinic phone number is [phone].  Please show the CHEMO ALERT CARD at check-in to the Emergency Department and triage nurse.   

## 2019-12-17 ENCOUNTER — Ambulatory Visit
Admission: RE | Admit: 2019-12-17 | Discharge: 2019-12-17 | Disposition: A | Discharge status: Home / Self Care | Payer: Medicare Other | Source: Ambulatory Visit | Attending: Radiation Oncology | Admitting: Radiation Oncology

## 2019-12-17 ENCOUNTER — Other Ambulatory Visit: Payer: Self-pay

## 2019-12-17 DIAGNOSIS — C139 Malignant neoplasm of hypopharynx, unspecified: Secondary | ICD-10-CM | POA: Diagnosis not present

## 2019-12-18 ENCOUNTER — Ambulatory Visit
Admission: RE | Admit: 2019-12-18 | Discharge: 2019-12-18 | Disposition: A | Discharge status: Home / Self Care | Payer: Medicare Other | Source: Ambulatory Visit | Attending: Radiation Oncology | Admitting: Radiation Oncology

## 2019-12-18 ENCOUNTER — Other Ambulatory Visit: Payer: Self-pay

## 2019-12-18 ENCOUNTER — Inpatient Hospital Stay: Payer: Medicare Other | Admitting: Nutrition

## 2019-12-18 DIAGNOSIS — C139 Malignant neoplasm of hypopharynx, unspecified: Secondary | ICD-10-CM | POA: Diagnosis not present

## 2019-12-18 NOTE — Progress Notes (Signed)
Nutrition follow-up completed with patient after radiation therapy for hypopharyngeal cancer. Weight documented as 109 pounds on September 7 down from 111.4 pounds August 30. Noted patient has pedal edema. Reviewed labs: Sodium 132, glucose 128, albumin 2.4 on September 3. Patient reports she is drinking boost now instead of Ensure.  She drinks one carton a day. Dietary recall reveals patient is eating small amounts of chicken, ribs, hotdog, potato salad, and sliced tomatoes. She has started using Osmolite 1.5 via PEG. She overall is tolerating formula however she gave one carton of tube feeding via PEG after she had eaten food and she ended up vomiting.  She has tolerated tube feeding since that episode. She is experiencing taste alterations.  Nutrition diagnosis: Inadequate oral intake continues.  Estimated nutrition needs: 1700-2000 cal, 75-100 g protein, 2.0 L fluid.  Intervention: Educated patient on importance of separating the timing of tube feeding and oral intake.  Encouraged to wait 1 to 2 hours after she eats food before she does any bolus feeding. Reviewed increasing Osmolite 1.5-3 cartons a day.  She should continue at least one boost a day and soft foods as tolerated. Encouraged baking soda and salt water rinses to help with taste alterations. Teach back method used.  Monitoring, evaluation, goals: Patient will work to increase tube feeding and continue oral intake to meet greater than 90% estimated nutrition needs.  Next visit: Wednesday, September 15 after radiation therapy.  **Disclaimer: This note was dictated with voice recognition software. Similar sounding words can inadvertently be transcribed and this note may contain transcription errors which may not have been corrected upon publication of note.**

## 2019-12-19 ENCOUNTER — Other Ambulatory Visit: Payer: Self-pay

## 2019-12-19 ENCOUNTER — Ambulatory Visit
Admission: RE | Admit: 2019-12-19 | Discharge: 2019-12-19 | Disposition: A | Discharge status: Home / Self Care | Payer: Medicare Other | Source: Ambulatory Visit | Attending: Radiation Oncology | Admitting: Radiation Oncology

## 2019-12-19 DIAGNOSIS — C139 Malignant neoplasm of hypopharynx, unspecified: Secondary | ICD-10-CM | POA: Diagnosis not present

## 2019-12-20 ENCOUNTER — Inpatient Hospital Stay: Payer: Medicare Other

## 2019-12-20 ENCOUNTER — Ambulatory Visit
Admission: RE | Admit: 2019-12-20 | Discharge: 2019-12-20 | Disposition: A | Discharge status: Home / Self Care | Payer: Medicare Other | Source: Ambulatory Visit | Attending: Radiation Oncology | Admitting: Radiation Oncology

## 2019-12-20 VITALS — BP 118/67 | HR 66 | Temp 98.7°F | Resp 16

## 2019-12-20 DIAGNOSIS — Z5111 Encounter for antineoplastic chemotherapy: Secondary | ICD-10-CM | POA: Diagnosis not present

## 2019-12-20 DIAGNOSIS — C139 Malignant neoplasm of hypopharynx, unspecified: Secondary | ICD-10-CM

## 2019-12-20 DIAGNOSIS — Z95828 Presence of other vascular implants and grafts: Secondary | ICD-10-CM

## 2019-12-20 LAB — CBC WITH DIFFERENTIAL (CANCER CENTER ONLY)
Abs Immature Granulocytes: 0.02 10*3/uL (ref 0.00–0.07)
Basophils Absolute: 0 10*3/uL (ref 0.0–0.1)
Basophils Relative: 1 %
Eosinophils Absolute: 0 10*3/uL (ref 0.0–0.5)
Eosinophils Relative: 1 %
HCT: 30.1 % — ABNORMAL LOW (ref 36.0–46.0)
Hemoglobin: 10.5 g/dL — ABNORMAL LOW (ref 12.0–15.0)
Immature Granulocytes: 1 %
Lymphocytes Relative: 10 %
Lymphs Abs: 0.2 10*3/uL — ABNORMAL LOW (ref 0.7–4.0)
MCH: 27.9 pg (ref 26.0–34.0)
MCHC: 34.9 g/dL (ref 30.0–36.0)
MCV: 80.1 fL (ref 80.0–100.0)
Monocytes Absolute: 0.3 10*3/uL (ref 0.1–1.0)
Monocytes Relative: 14 %
Neutro Abs: 1.5 10*3/uL — ABNORMAL LOW (ref 1.7–7.7)
Neutrophils Relative %: 73 %
Platelet Count: 194 10*3/uL (ref 150–400)
RBC: 3.76 MIL/uL — ABNORMAL LOW (ref 3.87–5.11)
RDW: 13.2 % (ref 11.5–15.5)
WBC Count: 2 10*3/uL — ABNORMAL LOW (ref 4.0–10.5)
nRBC: 0 % (ref 0.0–0.2)

## 2019-12-20 LAB — CMP (CANCER CENTER ONLY)
ALT: 7 U/L (ref 0–44)
AST: 17 U/L (ref 15–41)
Albumin: 2.4 g/dL — ABNORMAL LOW (ref 3.5–5.0)
Alkaline Phosphatase: 73 U/L (ref 38–126)
Anion gap: 6 (ref 5–15)
BUN: 11 mg/dL (ref 8–23)
CO2: 29 mmol/L (ref 22–32)
Calcium: 6.8 mg/dL — ABNORMAL LOW (ref 8.9–10.3)
Chloride: 97 mmol/L — ABNORMAL LOW (ref 98–111)
Creatinine: 0.83 mg/dL (ref 0.44–1.00)
GFR, Est AFR Am: >60 mL/min (ref >60)
GFR, Estimated: >60 mL/min (ref >60)
Glucose, Bld: 106 mg/dL — ABNORMAL HIGH (ref 70–99)
Potassium: 3.6 mmol/L (ref 3.5–5.1)
Sodium: 132 mmol/L — ABNORMAL LOW (ref 135–145)
Total Bilirubin: 0.5 mg/dL (ref 0.3–1.2)
Total Protein: 5.8 g/dL — ABNORMAL LOW (ref 6.5–8.1)

## 2019-12-20 MED ORDER — PALONOSETRON HCL INJECTION 0.25 MG/5ML
INTRAVENOUS | Status: AC
  Filled 2019-12-20: qty 5

## 2019-12-20 MED ORDER — SODIUM CHLORIDE 0.9 % IV SOLN
Freq: Once | INTRAVENOUS | Status: AC
  Filled 2019-12-20: qty 250

## 2019-12-20 MED ORDER — SODIUM CHLORIDE 0.9 % IV SOLN
150.0000 mg | Freq: Once | INTRAVENOUS | Status: AC
  Administered 2019-12-20: 150 mg via INTRAVENOUS
  Filled 2019-12-20: qty 150

## 2019-12-20 MED ORDER — PALONOSETRON HCL INJECTION 0.25 MG/5ML
0.2500 mg | Freq: Once | INTRAVENOUS | Status: AC
  Administered 2019-12-20: 0.25 mg via INTRAVENOUS

## 2019-12-20 MED ORDER — POTASSIUM CHLORIDE 2 MEQ/ML IV SOLN
Freq: Once | INTRAVENOUS | Status: AC
  Filled 2019-12-20: qty 10

## 2019-12-20 MED ORDER — SODIUM CHLORIDE 0.9 % IV SOLN
10.0000 mg | Freq: Once | INTRAVENOUS | Status: AC
  Administered 2019-12-20: 10 mg via INTRAVENOUS
  Filled 2019-12-20: qty 10

## 2019-12-20 MED ORDER — SODIUM CHLORIDE 0.9% FLUSH
10.0000 mL | INTRAVENOUS | Status: DC | PRN
  Administered 2019-12-20: 10 mL via INTRAVENOUS
  Filled 2019-12-20: qty 10

## 2019-12-20 MED ORDER — HEPARIN SOD (PORK) LOCK FLUSH 100 UNIT/ML IV SOLN
500.0000 [IU] | Freq: Once | INTRAVENOUS | Status: AC | PRN
  Administered 2019-12-20: 500 [IU]
  Filled 2019-12-20: qty 5

## 2019-12-20 MED ORDER — SODIUM CHLORIDE 0.9% FLUSH
10.0000 mL | INTRAVENOUS | Status: DC | PRN
  Administered 2019-12-20: 10 mL
  Filled 2019-12-20: qty 10

## 2019-12-20 MED ORDER — SODIUM CHLORIDE 0.9 % IV SOLN
20.0000 mg/m2 | Freq: Once | INTRAVENOUS | Status: AC
  Administered 2019-12-20: 30 mg via INTRAVENOUS
  Filled 2019-12-20: qty 30

## 2019-12-20 NOTE — Progress Notes (Signed)
Ok to proceed with treatment today with 100 mL of output per Dr. Alen Blew.   MD also aware of Calcium of 6.8 - Pt is asymptomatic of any muscle cramping or weakness.  Ok to proceed.    @ 1328 nurse was notified by staff that patient was "choking and feeling short of breath."  Pt was currently during her 30 min wait prior to chemo with completion of Emend.  VS obtained via flowsheet, Sandi Mealy, PA-C notified.   Verbal orders given for Pepcid 20mg .  Pepcid administered at 1339, patient verbalized symptoms were improving.  Will continue to monitor, OK to administer chemotherapy once Pepcid is complete and symptoms subside.

## 2019-12-20 NOTE — Patient Instructions (Addendum)
[facility] Discharge Instructions for Patients Receiving Chemotherapy  Today you received the following chemotherapy agents: Cisplatin.  To help prevent nausea and vomiting after your treatment, we encourage you to take your nausea medication as directed.   If you develop nausea and vomiting that is not controlled by your nausea medication, call the clinic.   BELOW ARE SYMPTOMS THAT SHOULD BE REPORTED IMMEDIATELY:  *FEVER GREATER THAN 100.5 F  *CHILLS WITH OR WITHOUT FEVER  NAUSEA AND VOMITING THAT IS NOT CONTROLLED WITH YOUR NAUSEA MEDICATION  *UNUSUAL SHORTNESS OF BREATH  *UNUSUAL BRUISING OR BLEEDING  TENDERNESS IN MOUTH AND THROAT WITH OR WITHOUT PRESENCE OF ULCERS  *URINARY PROBLEMS  *BOWEL PROBLEMS  UNUSUAL RASH Items with * indicate a potential emergency and should be followed up as soon as possible.  Feel free to call the clinic should you have any questions or concerns. The clinic phone number is [phone].  Please show the [place] at check-in to the Emergency Department and triage nurse.   Hypocalcemia, Adult Hypocalcemia is when the level of calcium in a person's blood is below normal. Calcium is a mineral that is used by the body in many ways. Not having enough blood calcium can affect the nervous system. This can lead to problems with muscles, the heart, and the brain. What are the causes? This condition may be caused by:  A deficiency of vitamin D or magnesium or both.  Decreased levels of parathyroid hormone (hypoparathyroidism).  Kidney function problems.  Low levels of a body protein called albumin.  Inflammation of the pancreas (pancreatitis).  Not taking in enough vitamins and minerals in the diet or having intestinal problems that interfere with nutrient absorption.  Certain medicines. What are the signs or symptoms? Some people may not have any symptoms, especially if they have long-term (chronic)  hypocalcemia. Symptoms of this condition may include:  Numbness and tingling in the fingers, toes, or around the mouth.  Muscle twitching, aches, or cramps, especially in the legs, feet, and back.  Spasm of the voice box (laryngospasm). This may make it difficult to breath or speak.  Fast heartbeats (palpitations) and abnormal heart rhythms (arrhythmias).  Shaking uncontrollably (seizures).  Memory problems, confusion, or difficulty thinking.  Depression, anxiety, irritability, or changes in personality. Long-term symptoms of this condition may include:  Coarse, brittle hair and nails.  Dry skin or lasting skin diseases (psoriasis, eczema, or dermatitis).  Dental cavities.  Clouding of the eye lens (cataracts). How is this diagnosed?  This condition is usually diagnosed with a blood test. You may also have other tests to help determine the underlying cause of the condition. This may include more blood tests and imaging tests. How is this treated? This condition may be treated with:  Calcium given by mouth (orally) or given through an IV. The method used for giving calcium will depend on the severity of the condition. If your condition is severe, you may need to be closely monitored in the hospital.  Giving other minerals (electrolytes), such as magnesium. Other treatment will depend on the cause of the condition. Follow these instructions at home:  Follow diet instructions from your health care provider or dietitian.  Take supplements only as told by your health care provider.  Keep all follow-up visits as told by your health care provider. This is important. Contact a health care provider if you:  Have increased muscle twitching or cramps.  Have new swelling in the feet, ankles, or legs.  Develop changes in mood, memory, or personality. Get help right away if you:  Have chest pain.  Have persistent rapid or irregular heartbeats.  Have difficulty  breathing.  Faint.  Start to have seizures.  Have confusion. Summary  Hypocalcemia is when the level of calcium in a person's blood is below normal. Not having enough blood calcium can affect the nervous system. This can lead to problems with muscles, the heart, and the brain.  This condition may be treated with calcium given by mouth or through an IV, taking other minerals, and treating the underlying cause of hypocalcemia.  Take supplements only as told by your health care provider.  Contact a health care provider if you have new or worsening symptoms.  Keep all follow-up visits as told by your health care provider. This is important. This information is not intended to replace advice given to you by your health care provider. Make sure you discuss any questions you have with your health care provider. Document Revised: 04/06/2018 Document Reviewed: 04/06/2018 Elsevier Patient Education  2020 [employer].

## 2019-12-23 ENCOUNTER — Other Ambulatory Visit: Payer: Self-pay

## 2019-12-23 ENCOUNTER — Other Ambulatory Visit: Payer: Self-pay | Admitting: Radiation Oncology

## 2019-12-23 ENCOUNTER — Ambulatory Visit
Admission: RE | Admit: 2019-12-23 | Discharge: 2019-12-23 | Disposition: A | Discharge status: Home / Self Care | Payer: Medicare Other | Source: Ambulatory Visit | Attending: Radiation Oncology | Admitting: Radiation Oncology

## 2019-12-23 DIAGNOSIS — C139 Malignant neoplasm of hypopharynx, unspecified: Secondary | ICD-10-CM

## 2019-12-23 MED ORDER — RADIAPLEXRX EX GEL: Freq: Once | CUTANEOUS | Status: AC

## 2019-12-24 ENCOUNTER — Ambulatory Visit
Admission: RE | Admit: 2019-12-24 | Discharge: 2019-12-24 | Disposition: A | Discharge status: Home / Self Care | Payer: Medicare Other | Source: Ambulatory Visit | Attending: Radiation Oncology | Admitting: Radiation Oncology

## 2019-12-24 ENCOUNTER — Other Ambulatory Visit: Payer: Self-pay

## 2019-12-24 DIAGNOSIS — C139 Malignant neoplasm of hypopharynx, unspecified: Secondary | ICD-10-CM | POA: Diagnosis not present

## 2019-12-25 ENCOUNTER — Inpatient Hospital Stay: Payer: Medicare Other | Admitting: Nutrition

## 2019-12-25 ENCOUNTER — Other Ambulatory Visit: Payer: Self-pay

## 2019-12-25 ENCOUNTER — Ambulatory Visit
Admission: RE | Admit: 2019-12-25 | Discharge: 2019-12-25 | Disposition: A | Discharge status: Home / Self Care | Payer: Medicare Other | Source: Ambulatory Visit | Attending: Radiation Oncology | Admitting: Radiation Oncology

## 2019-12-25 DIAGNOSIS — C139 Malignant neoplasm of hypopharynx, unspecified: Secondary | ICD-10-CM | POA: Diagnosis not present

## 2019-12-25 NOTE — Progress Notes (Signed)
Nutrition follow-up completed with patient after radiation therapy for hypopharyngeal cancer. Weight improved and documented as 111.8 pounds.  This is increased from 109 pounds September 7. Noted labs: Sodium 132 and glucose 106 on September 10. Continues to drink and oral nutrition supplements.  She consumes 1 carton daily. She is trying to use 1 carton of Osmolite 1.5 via PEG daily. She continues to flush her feeding tube with water. She is eating small amounts such as a banana sandwich, yogurt, orange and a cookie. Noted constipation.  MD prescribed MiraLAX as needed.  Nutrition diagnosis: Inadequate oral intake improved.  Estimated nutrition needs: 1700-2000 cal, 75-100 g protein, 2.0 L fluid.  Intervention: Educated patient to consume 1 bottle of Ensure Enlive or equivalent by mouth daily. Increase Osmolite 1.5-2 cartons every day. Reviewed instructions for free water flushes. Encouraged oral intake. Provided samples.  Goal: 5 cartons of Osmolite 1.5 and water flushes provides 1775 cal, 74.5 g protein, 1505 mL free water.  Monitoring, evaluation, goals: Patient will work to increase oral intake and tube feeding to meet greater than 90% estimated nutrition needs.  Next visit: Wednesday, September 22.  **Disclaimer: This note was dictated with voice recognition software. Similar sounding words can inadvertently be transcribed and this note may contain transcription errors which may not have been corrected upon publication of note.**

## 2019-12-26 ENCOUNTER — Inpatient Hospital Stay (HOSPITAL_BASED_OUTPATIENT_CLINIC_OR_DEPARTMENT_OTHER): Payer: Medicare Other | Admitting: Oncology

## 2019-12-26 ENCOUNTER — Inpatient Hospital Stay: Payer: Medicare Other | Admitting: General Practice

## 2019-12-26 ENCOUNTER — Inpatient Hospital Stay: Payer: Medicare Other

## 2019-12-26 ENCOUNTER — Other Ambulatory Visit: Payer: Self-pay

## 2019-12-26 ENCOUNTER — Ambulatory Visit
Admission: RE | Admit: 2019-12-26 | Discharge: 2019-12-26 | Disposition: A | Discharge status: Home / Self Care | Payer: Medicare Other | Source: Ambulatory Visit | Attending: Radiation Oncology | Admitting: Radiation Oncology

## 2019-12-26 ENCOUNTER — Telehealth: Payer: Self-pay | Admitting: Oncology

## 2019-12-26 ENCOUNTER — Ambulatory Visit: Payer: Medicare Other | Attending: Radiation Oncology

## 2019-12-26 VITALS — BP 132/81 | HR 60 | Temp 97.7°F | Resp 20 | Ht 65.0 in | Wt 107.5 lb

## 2019-12-26 DIAGNOSIS — C139 Malignant neoplasm of hypopharynx, unspecified: Secondary | ICD-10-CM | POA: Diagnosis not present

## 2019-12-26 DIAGNOSIS — R1313 Dysphagia, pharyngeal phase: Secondary | ICD-10-CM

## 2019-12-26 DIAGNOSIS — Z5111 Encounter for antineoplastic chemotherapy: Secondary | ICD-10-CM | POA: Diagnosis not present

## 2019-12-26 DIAGNOSIS — Z95828 Presence of other vascular implants and grafts: Secondary | ICD-10-CM

## 2019-12-26 DIAGNOSIS — C132 Malignant neoplasm of posterior wall of hypopharynx: Secondary | ICD-10-CM

## 2019-12-26 LAB — CBC WITH DIFFERENTIAL (CANCER CENTER ONLY)
Abs Immature Granulocytes: 0.01 10*3/uL (ref 0.00–0.07)
Basophils Absolute: 0 10*3/uL (ref 0.0–0.1)
Basophils Relative: 0 %
Eosinophils Absolute: 0 10*3/uL (ref 0.0–0.5)
Eosinophils Relative: 0 %
HCT: 28.8 % — ABNORMAL LOW (ref 36.0–46.0)
Hemoglobin: 10.1 g/dL — ABNORMAL LOW (ref 12.0–15.0)
Immature Granulocytes: 0 %
Lymphocytes Relative: 10 %
Lymphs Abs: 0.2 10*3/uL — ABNORMAL LOW (ref 0.7–4.0)
MCH: 28.3 pg (ref 26.0–34.0)
MCHC: 35.1 g/dL (ref 30.0–36.0)
MCV: 80.7 fL (ref 80.0–100.0)
Monocytes Absolute: 0.3 10*3/uL (ref 0.1–1.0)
Monocytes Relative: 14 %
Neutro Abs: 1.7 10*3/uL (ref 1.7–7.7)
Neutrophils Relative %: 76 %
Platelet Count: 206 10*3/uL (ref 150–400)
RBC: 3.57 MIL/uL — ABNORMAL LOW (ref 3.87–5.11)
RDW: 13.4 % (ref 11.5–15.5)
WBC Count: 2.3 10*3/uL — ABNORMAL LOW (ref 4.0–10.5)
nRBC: 0 % (ref 0.0–0.2)

## 2019-12-26 LAB — CMP (CANCER CENTER ONLY)
ALT: 8 U/L (ref 0–44)
AST: 16 U/L (ref 15–41)
Albumin: 2.5 g/dL — ABNORMAL LOW (ref 3.5–5.0)
Alkaline Phosphatase: 77 U/L (ref 38–126)
Anion gap: 10 (ref 5–15)
BUN: 14 mg/dL (ref 8–23)
CO2: 29 mmol/L (ref 22–32)
Calcium: 7.4 mg/dL — ABNORMAL LOW (ref 8.9–10.3)
Chloride: 96 mmol/L — ABNORMAL LOW (ref 98–111)
Creatinine: 1.03 mg/dL — ABNORMAL HIGH (ref 0.44–1.00)
GFR, Est AFR Am: >60 mL/min (ref >60)
GFR, Estimated: 56 mL/min — ABNORMAL LOW (ref >60)
Glucose, Bld: 78 mg/dL (ref 70–99)
Potassium: 4 mmol/L (ref 3.5–5.1)
Sodium: 135 mmol/L (ref 135–145)
Total Bilirubin: 0.4 mg/dL (ref 0.3–1.2)
Total Protein: 6 g/dL — ABNORMAL LOW (ref 6.5–8.1)

## 2019-12-26 MED ORDER — SODIUM CHLORIDE 0.9% FLUSH
10.0000 mL | Freq: Once | INTRAVENOUS | Status: AC
  Administered 2019-12-26: 10 mL
  Filled 2019-12-26: qty 10

## 2019-12-26 MED ORDER — ALTEPLASE 2 MG IJ SOLR
2.0000 mg | Freq: Once | INTRAMUSCULAR | Status: AC
  Administered 2019-12-26: 2 mg
  Filled 2019-12-26: qty 2

## 2019-12-26 MED ORDER — PALONOSETRON HCL INJECTION 0.25 MG/5ML
INTRAVENOUS | Status: AC
  Filled 2019-12-26: qty 5

## 2019-12-26 MED ORDER — SODIUM CHLORIDE 0.9 % IV SOLN
150.0000 mg | Freq: Once | INTRAVENOUS | Status: AC
  Administered 2019-12-26: 150 mg via INTRAVENOUS
  Filled 2019-12-26: qty 150

## 2019-12-26 MED ORDER — HEPARIN SOD (PORK) LOCK FLUSH 100 UNIT/ML IV SOLN
500.0000 [IU] | Freq: Once | INTRAVENOUS | Status: AC | PRN
  Administered 2019-12-26: 500 [IU]
  Filled 2019-12-26: qty 5

## 2019-12-26 MED ORDER — ALTEPLASE 2 MG IJ SOLR
INTRAMUSCULAR | Status: AC
  Filled 2019-12-26: qty 2

## 2019-12-26 MED ORDER — PALONOSETRON HCL INJECTION 0.25 MG/5ML
0.2500 mg | Freq: Once | INTRAVENOUS | Status: AC
  Administered 2019-12-26: 0.25 mg via INTRAVENOUS

## 2019-12-26 MED ORDER — POTASSIUM CHLORIDE 2 MEQ/ML IV SOLN
Freq: Once | INTRAVENOUS | Status: AC
  Filled 2019-12-26: qty 10

## 2019-12-26 MED ORDER — SODIUM CHLORIDE 0.9% FLUSH
10.0000 mL | INTRAVENOUS | Status: DC | PRN
  Administered 2019-12-26: 10 mL
  Filled 2019-12-26: qty 10

## 2019-12-26 MED ORDER — SODIUM CHLORIDE 0.9 % IV SOLN
Freq: Once | INTRAVENOUS | Status: AC
  Filled 2019-12-26: qty 250

## 2019-12-26 MED ORDER — SODIUM CHLORIDE 0.9 % IV SOLN
20.0000 mg/m2 | Freq: Once | INTRAVENOUS | Status: AC
  Administered 2019-12-26: 30 mg via INTRAVENOUS
  Filled 2019-12-26: qty 30

## 2019-12-26 MED ORDER — SODIUM CHLORIDE 0.9 % IV SOLN
10.0000 mg | Freq: Once | INTRAVENOUS | Status: AC
  Administered 2019-12-26: 10 mg via INTRAVENOUS
  Filled 2019-12-26: qty 10

## 2019-12-26 NOTE — Therapy (Signed)
[facility] [address] 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[facility])     Past Surgical History:  Procedure Laterality Date   DIRECT LARYNGOSCOPY N/A 10/15/2019   Procedure: DIRECT LARYNGOSCOPY w/BIOPSY;  Surgeon: Izora Gala, MD;  Location: [facility];  Service: ENT;  Laterality: N/A;   GASTROSTOMY N/A 12/04/2019   Procedure: LAPAROSCOPIC G TUBE PLACEMENT;  Surgeon: Ralene Ok, MD;  Location: [facility];  Service: General;  Laterality: N/A;   IR GASTROSTOMY TUBE MOD SED  11/15/2019   IR IMAGING GUIDED PORT INSERTION  11/15/2019   RIGID ESOPHAGOSCOPY N/A 10/15/2019   Procedure: RIGID ESOPHAGOSCOPY;  Surgeon: Izora Gala, MD;  Location: [facility];  Service: ENT;  Laterality: N/A;    There were no vitals filed for this visit.   Subjective Assessment - 12/26/19 1418    Subjective reports 90% of nutrition via PEG at this time. Last Rad visit 01-09-20. "I do the 'heee' and the tongue [facility])."    Currently in Pain? No/denies                 ADULT SLP TREATMENT - 12/26/19 1527      General Information   Behavior/Cognition Alert;Cooperative;Pleasant mood      Treatment Provided   Treatment provided Dysphagia      Dysphagia Treatment   Temperature Spikes Noted No    Respiratory Status Room air    Oral Cavity - Dentition Adequate natural dentition     Treatment Methods Skilled observation;Therapeutic exercise;Patient/caregiver education    Patient observed directly with PO's Yes    Type of PO's observed Thin liquids   pt politely refused solids   Liquids provided via Straw    Oral Phase Signs & Symptoms --   none noted today   Pharyngeal Phase Signs & Symptoms --   none noted today   Other treatment/comments Pt told SLP she completes Masako and pitch raise "almost every day" and provided a vague response when asked why she is doing the exercises. SLP explained rationale for BID, daily, and rationale for HEP in general. SLP guided pt through each exercise and reminded pt she could do #8 as an alternative to #4 ([place]). Max A req'd for exercises except Masako and pitch raise but pt able to be independent with exercises other than MAsako and pitch raise by session end. Encouraged pt to cont to drink and/or eat something everyday.       Assessment / Recommendations / Plan   Plan Continue with current plan of care      Progression Toward Goals   Progression toward goals --   not doing HEP as prescribed but doing well with POs           SLP Education - 12/26/19 1644    Education Details  HEP procedure, late effects head/neck radiation on swallow function, rationale for HEP    Person(s) Educated Patient    Methods Explanation;Verbal cues;Demonstration;Handout    Comprehension Verbalized understanding;Returned demonstration;Verbal cues required;Need further instruction            SLP Short Term Goals - 12/26/19 1650      SLP [place] #1   Title pt will complete HEP with rare min A    Time 1    Period --   sessions, for all STGs   Status On-going      SLP SHORT TERM GOAL #2   Title pt will tell SLP why pt is completing HEP with modified independence    Status Not Met      SLP SHORT TERM GOAL #3   Title pt will describe 3 overt s/s aspiration PNA with modified independence    Time 1    Status On-going      SLP SHORT TERM  GOAL #4   Title pt will tell SLP how a food journal could hasten return to a more normalized diet    Time 2    Status On-going            SLP Long Term Goals - 12/26/19 1651      SLP LONG TERM GOAL #1   Title pt will complete HEP with modified independence over two visits    Time 3    Period --   sessions, for all LTGs   Status On-going      SLP LONG TERM GOAL #2   Title pt will describe how to modify HEP over time, and the timeline associated with reduction in HEP frequency with modified independence over two sessions    Time 5    Status On-going            Plan - 12/26/19 1645    Clinical Impression Statement At this time pt swallowing WNL with thin and pt reports no overt s/sx aspiration with solids, currently - improved from eval, likely due to tumor shrinkage. No overt s/sx aspiration PNA today, nor any reported to SLP by pt. Pt without "wet" voice today, improved from eval. Pt with laparoscopic G-tube placement since last visit -states 90% of nutrition /hydration via tube.- SLP told pt about muscle disuse atrophy and strongly indicated important to have something PO every day even if getting nutriotion/hydration with PEG. SLP reviewed  individualized HEP for dysphagia and pt completed each exercise on their own  with total A with faded cues (modified indpendent with masako and pitch raise). Data indicate that pt's swallow ability will likely decrease over the course of radiation therapy and could very well decline over time following conclusion of their radiation therapy due to muscle disuse atrophy and/or muscle fibrosis. Pt will cont to need to be seen by SLP in order to assess safety of PO intake, assess the need for recommending any objective swallow assessment, and ensuring pt correctly completes the individualized HEP.    Speech Therapy Frequency --   once approx every 4 weeks   Duration --   7 total visits   Treatment/Interventions Aspiration precaution training;Pharyngeal  strengthening exercises;Diet toleration management by SLP;Trials of upgraded texture/liquids;Patient/family education;SLP instruction and feedback;Compensatory strategies    Potential to Achieve Goals Fair    Potential Considerations Severity of impairments    SLP Home Exercise Plan provided today    Consulted and Agree with Plan of Care Patient  Patient will benefit from skilled therapeutic intervention in order to improve the following deficits and impairments:   Pharyngeal dysphagia    Problem List Patient Active Problem List   Diagnosis Date Noted   Port-A-Cath in place 12/26/2019   Carcinoma of posterior hypopharyngeal wall ([facility]) 11/16/2019   Goals of care, counseling/discussion 10/31/2019   Hypopharyngeal cancer ([facility]) 10/28/2019    [facility] ,[place], [place]  12/26/2019, 4:51 PM  [facility] [address] [place], [place], [address] Phone: [phone]   Fax:  [phone]   Name: Tina Kennedy MRN: [id number] Date of Birth: 07-05-1950

## 2019-12-26 NOTE — Progress Notes (Signed)
Hematology and Oncology Follow Up Visit  Raylynn Hersh Shineka Auble [id number] [birth year] 69 y.o. 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  She presented with stage III disease and local lymph node involvement.   Prior Therapy:  She is status post direct laryngoscopy and biopsy completed on October 15, 2019.  Current therapy: Chemotherapy with radiation started in November 22, 2019.  She is currently receiving cisplatin 20 mg per metered square weekly after initial dose reduction related to leukocytopenia.  She is here for cycle 6 of therapy.  Interim History: Ms. Shadd returns today for repeat evaluation.  Since the last visit, she reports no major changes in her health.  She has tolerated therapy with radiation and chemotherapy without any major complications.  She does report some skin irritation and dysphagia but overall manageable.  She has not reported any increased pain or discomfort.  She denies any nausea, vomiting or diarrhea.  She continues to attempt activities of daily living without any decline.     Medications: reviewed with no changes. Current Outpatient Medications  Medication Sig Dispense Refill  . HYDROcodone-acetaminophen (HYCET) 7.5-325 mg/15 ml solution Take 15 mLs by mouth 4 (four) times daily as needed for moderate pain. 473 mL 0  . lidocaine (XYLOCAINE) 2 % solution Patient: Mix 1part 2% viscous lidocaine, 1part H20. Swallow 4mL of diluted mixture, 28min before meals and at bedtime, up to QID (Patient not taking: Reported on 11/29/2019) 200 mL 3  . lidocaine-prilocaine (EMLA) cream Apply 1 application topically as needed. 30 g 0  . Nutritional Supplements (FEEDING SUPPLEMENT, OSMOLITE 1.5 CAL,) LIQD Give 1 carton Osmolite 1.5 via G-tube with 60 mL free water before and after each feeding 5 times daily, 3 hours apart.  This provides  greater than 90% estimated needs.  1775 cal, 74.5 g protein, 1505 mL free water.  Please send supplies and formula. 1185 mL 6  . prochlorperazine (COMPAZINE) 10 MG tablet Take 1 tablet (10 mg total) by mouth every 6 (six) hours as needed for nausea or vomiting. (Patient not taking: Reported on 12/06/2019) 30 tablet 0   No current facility-administered medications for this visit.     Allergies: No Known Allergies    Physical Exam:  Blood pressure 132/81, pulse 60, temperature 97.7 F (36.5 C), temperature source Tympanic, resp. rate 20, height 5\' 5"  (1.651 m), weight 107 lb 8 oz (48.8 kg), SpO2 100 %.   ECOG: 1    General appearance: Comfortable appearing without any discomfort Head: Normocephalic without any trauma Oropharynx: Mucous membranes are moist and pink without any thrush or ulcers. Eyes: Pupils are equal and round reactive to light. Lymph nodes: No cervical, supraclavicular, inguinal or axillary lymphadenopathy.   Heart:regular rate and rhythm.  S1 and S2 without leg edema. Lung: Clear without any rhonchi or wheezes.  No dullness to percussion. Abdomin: Soft, nontender, nondistended with good bowel sounds.  No hepatosplenomegaly. Musculoskeletal: No joint deformity or effusion.  Full range of motion noted. Neurological: No deficits noted on motor, sensory and deep tendon reflex exam. Skin: Mild irritation noted on her neck and tender to touch.       Lab Results: Lab Results  Component Value Date   WBC 2.0 (L) 12/20/2019   HGB 10.5 (L) 12/20/2019   HCT 30.1 (L) 12/20/2019   MCV 80.1 12/20/2019   PLT 194 12/20/2019     Chemistry  Component Value Date/Time   NA 132 (L) 12/20/2019 0908   K 3.6 12/20/2019 0908   CL 97 (L) 12/20/2019 0908   CO2 29 12/20/2019 0908   BUN 11 12/20/2019 0908   CREATININE 0.83 12/20/2019 0908      Component Value Date/Time   CALCIUM 6.8 (L) 12/20/2019 0908   ALKPHOS 73 12/20/2019 0908   AST 17 12/20/2019 0908   ALT 7  12/20/2019 0908   BILITOT 0.5 12/20/2019 0908         Impression and Plan:   69 year old woman with:  1.  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    She continues to tolerate therapy without any major complications.  Risks and benefits of continuing cisplatin based chemotherapy were reviewed.  Complications including neutropenia, nausea, vomiting, renal insufficiency and electrolyte imbalance were reiterated.  She will receive cisplatin chemotherapy today and will be the last chemotherapy treatment.  2.  IV access: Port-A-Cath currently in use without any issues.  3.  Nutrition: Continues to be adequate at this time with weight is stable.   4.  Antiemetics: No nausea or vomiting reported.  Antiemetics are available to her.  5.  Renal function surveillance: Creatinine is stable on platinum based therapy.  We will continue to monitor.  6.  Prognosis and goals of care: Her disease is curable and aggressive measures are warranted.  7.  Follow-up: She will return in 1 week for IV hydration only instead of chemotherapy.  30  minutes were spent on this encounter.  The time was dedicated to reviewing her disease status, treatment options and future plan of care review.   Zola Button, MD 9/16/20218:13 AM

## 2019-12-26 NOTE — Telephone Encounter (Signed)
Scheduled per 09/16 los, patient has been called and voicemail was left.

## 2019-12-26 NOTE — Progress Notes (Signed)
Ok to treat w/ 120 cc urine out per Order from Dr/ Alen Blew

## 2019-12-26 NOTE — Progress Notes (Signed)
[facility] CSW Progress Notes  Check in visit w patient in infusion.  Overall, she is doing well.  She is happy that today is her last chemotherapy. She has a few more radiation treatments. She is looking forward to the end of treatment.  She mentions issues w her neck - states she will work harder on exercises prescribed by Electrical engineer.  CSW reinforced the importance of diet and speech exercises in recovery from treatment from head and neck cancer.  Edwyna Shell, LCSW Clinical Social Worker Phone:  [phone]

## 2019-12-26 NOTE — Patient Instructions (Signed)
[facility] Discharge Instructions for Patients Receiving Chemotherapy  Today you received the following chemotherapy agents: Cisplatin.  To help prevent nausea and vomiting after your treatment, we encourage you to take your nausea medication as directed.   If you develop nausea and vomiting that is not controlled by your nausea medication, call the clinic.   BELOW ARE SYMPTOMS THAT SHOULD BE REPORTED IMMEDIATELY:  *FEVER GREATER THAN 100.5 F  *CHILLS WITH OR WITHOUT FEVER  NAUSEA AND VOMITING THAT IS NOT CONTROLLED WITH YOUR NAUSEA MEDICATION  *UNUSUAL SHORTNESS OF BREATH  *UNUSUAL BRUISING OR BLEEDING  TENDERNESS IN MOUTH AND THROAT WITH OR WITHOUT PRESENCE OF ULCERS  *URINARY PROBLEMS  *BOWEL PROBLEMS  UNUSUAL RASH Items with * indicate a potential emergency and should be followed up as soon as possible.  Feel free to call the clinic should you have any questions or concerns. The clinic phone number is [phone].  Please show the [facility] at check-in to the Emergency Department and triage nurse.   Hypocalcemia, Adult Hypocalcemia is when the level of calcium in a person's blood is below normal. Calcium is a mineral that is used by the body in many ways. Not having enough blood calcium can affect the nervous system. This can lead to problems with muscles, the heart, and the brain. What are the causes? This condition may be caused by:  A deficiency of vitamin D or magnesium or both.  Decreased levels of parathyroid hormone (hypoparathyroidism).  Kidney function problems.  Low levels of a body protein called albumin.  Inflammation of the pancreas (pancreatitis).  Not taking in enough vitamins and minerals in the diet or having intestinal problems that interfere with nutrient absorption.  Certain medicines. What are the signs or symptoms? Some people may not have any symptoms, especially if they have long-term (chronic)  hypocalcemia. Symptoms of this condition may include:  Numbness and tingling in the fingers, toes, or around the mouth.  Muscle twitching, aches, or cramps, especially in the legs, feet, and back.  Spasm of the voice box (laryngospasm). This may make it difficult to breath or speak.  Fast heartbeats (palpitations) and abnormal heart rhythms (arrhythmias).  Shaking uncontrollably (seizures).  Memory problems, confusion, or difficulty thinking.  Depression, anxiety, irritability, or changes in personality. Long-term symptoms of this condition may include:  Coarse, brittle hair and nails.  Dry skin or lasting skin diseases (psoriasis, eczema, or dermatitis).  Dental cavities.  Clouding of the eye lens (cataracts). How is this diagnosed?  This condition is usually diagnosed with a blood test. You may also have other tests to help determine the underlying cause of the condition. This may include more blood tests and imaging tests. How is this treated? This condition may be treated with:  Calcium given by mouth (orally) or given through an IV. The method used for giving calcium will depend on the severity of the condition. If your condition is severe, you may need to be closely monitored in the hospital.  Giving other minerals (electrolytes), such as magnesium. Other treatment will depend on the cause of the condition. Follow these instructions at home:  Follow diet instructions from your health care provider or dietitian.  Take supplements only as told by your health care provider.  Keep all follow-up visits as told by your health care provider. This is important. Contact a health care provider if you:  Have increased muscle twitching or cramps.  Have new swelling in the feet, ankles, or legs.  Develop changes in mood, memory, or personality. Get help right away if you:  Have chest pain.  Have persistent rapid or irregular heartbeats.  Have difficulty  breathing.  Faint.  Start to have seizures.  Have confusion. Summary  Hypocalcemia is when the level of calcium in a person's blood is below normal. Not having enough blood calcium can affect the nervous system. This can lead to problems with muscles, the heart, and the brain.  This condition may be treated with calcium given by mouth or through an IV, taking other minerals, and treating the underlying cause of hypocalcemia.  Take supplements only as told by your health care provider.  Contact a health care provider if you have new or worsening symptoms.  Keep all follow-up visits as told by your health care provider. This is important. This information is not intended to replace advice given to you by your health care provider. Make sure you discuss any questions you have with your health care provider. Document Revised: 04/06/2018 Document Reviewed: 04/06/2018 Elsevier Patient Education  2020 [employer].

## 2019-12-27 ENCOUNTER — Ambulatory Visit: Payer: Medicare Other

## 2019-12-27 ENCOUNTER — Other Ambulatory Visit: Payer: Medicare Other

## 2019-12-30 ENCOUNTER — Ambulatory Visit
Admission: RE | Admit: 2019-12-30 | Discharge: 2019-12-30 | Disposition: A | Discharge status: Home / Self Care | Payer: Medicare Other | Source: Ambulatory Visit | Attending: Radiation Oncology | Admitting: Radiation Oncology

## 2019-12-30 ENCOUNTER — Other Ambulatory Visit: Payer: Self-pay

## 2019-12-30 DIAGNOSIS — C139 Malignant neoplasm of hypopharynx, unspecified: Secondary | ICD-10-CM | POA: Diagnosis not present

## 2019-12-31 ENCOUNTER — Ambulatory Visit
Admission: RE | Admit: 2019-12-31 | Discharge: 2019-12-31 | Disposition: A | Discharge status: Home / Self Care | Payer: Medicare Other | Source: Ambulatory Visit | Attending: Radiation Oncology | Admitting: Radiation Oncology

## 2019-12-31 ENCOUNTER — Other Ambulatory Visit: Payer: Self-pay

## 2019-12-31 DIAGNOSIS — C139 Malignant neoplasm of hypopharynx, unspecified: Secondary | ICD-10-CM | POA: Diagnosis not present

## 2020-01-01 ENCOUNTER — Ambulatory Visit
Admission: RE | Admit: 2020-01-01 | Discharge: 2020-01-01 | Disposition: A | Discharge status: Home / Self Care | Payer: Medicare Other | Source: Ambulatory Visit | Attending: Radiation Oncology | Admitting: Radiation Oncology

## 2020-01-01 ENCOUNTER — Inpatient Hospital Stay: Payer: Medicare Other | Admitting: Nutrition

## 2020-01-01 ENCOUNTER — Other Ambulatory Visit: Payer: Self-pay

## 2020-01-01 DIAGNOSIS — C139 Malignant neoplasm of hypopharynx, unspecified: Secondary | ICD-10-CM | POA: Diagnosis not present

## 2020-01-01 NOTE — Progress Notes (Signed)
Nutrition follow-up completed with patient after radiation therapy for hypopharyngeal cancer. Weight decreased and documented as 107.5 pounds on September 16. Patient reports she is not able to eat much due to taste alterations. She is refusing oral nutrition supplements. Reports using 3 cartons of Osmolite 1.5 via feeding tube daily. She reports she tolerates this well however does not think she can increase Osmolite 1.5 to goal rate of 5 cartons daily. Reports her bowels are moving well.  Nutrition diagnosis: Inadequate oral intake continues.  Estimated nutrition needs: 1700-2000 cal, 75-100 g protein, 2.2 L removed.  Intervention: Reviewed strategies for increasing Osmolite 1.5 to goal rate of 5 cartons daily. Encouraged 1 carton every 3 hours with 60 mL free water before and after each bolus feeding. Reviewed strategies for giving formula slowly. Continued oral intake as tolerated. Assisted patient with contact information for home health so she can order additional tube feeding.  Provided 1 complementary case of Osmolite 1.5 today.  Goal: 5 cartons of Osmolite 1.5 and water flushes provides 1775 cal, 74.5 g protein, 1505 mL free water.  Monitoring, evaluation, goals: Patient will work to increase tube feeding to meet greater than 90% estimated nutrition needs.  Next visit: Wednesday, September 29 after radiation therapy.  **Disclaimer: This note was dictated with voice recognition software. Similar sounding words can inadvertently be transcribed and this note may contain transcription errors which may not have been corrected upon publication of note.**

## 2020-01-02 ENCOUNTER — Other Ambulatory Visit: Payer: Self-pay

## 2020-01-02 ENCOUNTER — Ambulatory Visit
Admission: RE | Admit: 2020-01-02 | Discharge: 2020-01-02 | Disposition: A | Discharge status: Home / Self Care | Payer: Medicare Other | Source: Ambulatory Visit | Attending: Radiation Oncology | Admitting: Radiation Oncology

## 2020-01-02 DIAGNOSIS — C139 Malignant neoplasm of hypopharynx, unspecified: Secondary | ICD-10-CM | POA: Diagnosis not present

## 2020-01-03 ENCOUNTER — Ambulatory Visit: Payer: Medicare Other

## 2020-01-03 ENCOUNTER — Other Ambulatory Visit: Payer: Self-pay

## 2020-01-03 ENCOUNTER — Inpatient Hospital Stay: Payer: Medicare Other

## 2020-01-03 ENCOUNTER — Ambulatory Visit
Admission: RE | Admit: 2020-01-03 | Discharge: 2020-01-03 | Disposition: A | Discharge status: Home / Self Care | Payer: Medicare Other | Source: Ambulatory Visit | Attending: Radiation Oncology | Admitting: Radiation Oncology

## 2020-01-03 DIAGNOSIS — C139 Malignant neoplasm of hypopharynx, unspecified: Secondary | ICD-10-CM | POA: Diagnosis not present

## 2020-01-06 ENCOUNTER — Ambulatory Visit
Admission: RE | Admit: 2020-01-06 | Discharge: 2020-01-06 | Disposition: A | Discharge status: Home / Self Care | Payer: Medicare Other | Source: Ambulatory Visit | Attending: Radiation Oncology | Admitting: Radiation Oncology

## 2020-01-06 ENCOUNTER — Other Ambulatory Visit: Payer: Self-pay

## 2020-01-06 DIAGNOSIS — C139 Malignant neoplasm of hypopharynx, unspecified: Secondary | ICD-10-CM | POA: Diagnosis not present

## 2020-01-06 NOTE — Progress Notes (Signed)
Oncology Nurse Navigator Documentation  I met with Tina Kennedy before her scheduled PUT visit with Dr. Isidore Moos today. I brought her a calendar and explained her upcoming appointments to her. She has voiced her understanding. I will meet with her again on Wednesday to check in with her and answer and further questions she may have about her schedule.  Harlow Asa RN, BSN, OCN Head & Neck Oncology Nurse [place] at [facility] Phone # [phone]  Fax # [phone]

## 2020-01-07 ENCOUNTER — Other Ambulatory Visit: Payer: Self-pay

## 2020-01-07 ENCOUNTER — Ambulatory Visit
Admission: RE | Admit: 2020-01-07 | Discharge: 2020-01-07 | Disposition: A | Discharge status: Home / Self Care | Payer: Medicare Other | Source: Ambulatory Visit | Attending: Radiation Oncology | Admitting: Radiation Oncology

## 2020-01-07 DIAGNOSIS — C139 Malignant neoplasm of hypopharynx, unspecified: Secondary | ICD-10-CM | POA: Diagnosis not present

## 2020-01-08 ENCOUNTER — Ambulatory Visit
Admission: RE | Admit: 2020-01-08 | Discharge: 2020-01-08 | Disposition: A | Discharge status: Home / Self Care | Payer: Medicare Other | Source: Ambulatory Visit | Attending: Radiation Oncology | Admitting: Radiation Oncology

## 2020-01-08 ENCOUNTER — Ambulatory Visit: Payer: Medicare Other | Admitting: Nutrition

## 2020-01-08 ENCOUNTER — Ambulatory Visit: Payer: Medicare Other

## 2020-01-08 DIAGNOSIS — C139 Malignant neoplasm of hypopharynx, unspecified: Secondary | ICD-10-CM | POA: Diagnosis not present

## 2020-01-08 NOTE — Progress Notes (Signed)
Nutrition follow-up completed with patient after radiation therapy for hypopharyngeal cancer. Weight decreased and documented as 104.8 pounds on September 27 down from 107.5 pounds September 16. Patient reports she just increased Osmolite 1.5-5 cartons a day.  She is using 60 mL of free water before and after bolus feedings. She reports she is unable to eat food at this time secondary to sore throat and taste alterations. She reports drinking 1-16 ounce bottle of water every day. She has not yet called [facility] for tube feeding delivery.  Nutrition diagnosis: Inadequate oral intake continues.  Estimated nutrition needs: 1700-2000 cal, 75-100 g protein, 2.2 L fluid.  Intervention: Continue Osmolite 1.5, 5 cartons daily.  Recommend 1 carton every 3 hours with 60 mL free water before and after bolus feeding. Continue water by mouth as tolerated. Contact [facility] for additional cases of Osmolite 1.5.  5 cartons of Osmolite 1.5 and water flushes provides 1775 cal, 74.5 g protein, 1505 mL free water.  Monitoring, evaluation, goals: Patient will work to continue tube feeding and oral intake to meet greater than 90% estimated nutrition needs.  Next visit: Patient agrees to a telephone visit on Tuesday, October 19.  **Disclaimer: This note was dictated with voice recognition software. Similar sounding words can inadvertently be transcribed and this note may contain transcription errors which may not have been corrected upon publication of note.**

## 2020-01-08 NOTE — Progress Notes (Signed)
Oncology Nurse Navigator Documentation  Met with Tina Kennedy after RT to offer support and to celebrate upcoming end of radiation treatment on 01/10/20. Provided verbal/written post-RT guidance:  Importance of keeping all follow-up appts, especially those with Nutrition and SLP. She reports to me that she does not desire to follow up with SLP. I again explained the importance of follow up with SLP to prevent/decrease loss of swallowing function in the future. We agreed to discuss it again at her follow up appointment with Dr. Isidore Moos on 01/24/20.  Importance of protecting treatment area from sun.  Continuation of Sonafine application 2-3 times daily, application of antibiotic ointment to areas of raw skin; when supply of Sonafine exhausted transition to OTC lotion with vitamin E.  I removed the sutures from her PEG site as per guidance from [facility] without difficulty. Her skin is clean dry and intact. Provided/reviewed Epic calendar of upcoming appts. Explained my role as navigator will continue for several more months, encouraged him to call me with needs/concerns.    Harlow Asa RN, BSN, OCN Head & Neck Oncology Nurse [place] at [facility] Phone # [phone]  Fax # [phone]

## 2020-01-09 ENCOUNTER — Ambulatory Visit: Payer: Medicare Other

## 2020-01-09 ENCOUNTER — Ambulatory Visit
Admission: RE | Admit: 2020-01-09 | Discharge: 2020-01-09 | Disposition: A | Discharge status: Home / Self Care | Payer: Medicare Other | Source: Ambulatory Visit | Attending: Radiation Oncology | Admitting: Radiation Oncology

## 2020-01-09 DIAGNOSIS — C139 Malignant neoplasm of hypopharynx, unspecified: Secondary | ICD-10-CM | POA: Diagnosis not present

## 2020-01-10 ENCOUNTER — Encounter: Payer: Self-pay | Admitting: Radiation Oncology

## 2020-01-10 ENCOUNTER — Inpatient Hospital Stay: Payer: Medicare Other | Attending: Oncology

## 2020-01-10 ENCOUNTER — Inpatient Hospital Stay (HOSPITAL_BASED_OUTPATIENT_CLINIC_OR_DEPARTMENT_OTHER): Payer: Medicare Other | Admitting: Oncology

## 2020-01-10 ENCOUNTER — Ambulatory Visit
Admission: RE | Admit: 2020-01-10 | Discharge: 2020-01-10 | Disposition: A | Discharge status: Home / Self Care | Payer: Medicare Other | Source: Ambulatory Visit | Attending: Radiation Oncology | Admitting: Radiation Oncology

## 2020-01-10 ENCOUNTER — Encounter: Payer: Medicare Other | Admitting: Nutrition

## 2020-01-10 ENCOUNTER — Inpatient Hospital Stay: Payer: Medicare Other

## 2020-01-10 ENCOUNTER — Ambulatory Visit: Payer: Medicare Other

## 2020-01-10 ENCOUNTER — Other Ambulatory Visit: Payer: Self-pay

## 2020-01-10 VITALS — BP 114/72

## 2020-01-10 VITALS — BP 135/75 | HR 80 | Temp 97.5°F | Resp 18 | Ht 65.0 in | Wt 104.0 lb

## 2020-01-10 DIAGNOSIS — Z510 Encounter for antineoplastic radiation therapy: Secondary | ICD-10-CM | POA: Insufficient documentation

## 2020-01-10 DIAGNOSIS — D6481 Anemia due to antineoplastic chemotherapy: Secondary | ICD-10-CM | POA: Insufficient documentation

## 2020-01-10 DIAGNOSIS — C770 Secondary and unspecified malignant neoplasm of lymph nodes of head, face and neck: Secondary | ICD-10-CM | POA: Diagnosis present

## 2020-01-10 DIAGNOSIS — D759 Disease of blood and blood-forming organs, unspecified: Secondary | ICD-10-CM | POA: Insufficient documentation

## 2020-01-10 DIAGNOSIS — D701 Agranulocytosis secondary to cancer chemotherapy: Secondary | ICD-10-CM | POA: Diagnosis not present

## 2020-01-10 DIAGNOSIS — Z79899 Other long term (current) drug therapy: Secondary | ICD-10-CM | POA: Diagnosis not present

## 2020-01-10 DIAGNOSIS — C139 Malignant neoplasm of hypopharynx, unspecified: Secondary | ICD-10-CM | POA: Diagnosis present

## 2020-01-10 DIAGNOSIS — F17210 Nicotine dependence, cigarettes, uncomplicated: Secondary | ICD-10-CM | POA: Diagnosis not present

## 2020-01-10 DIAGNOSIS — C132 Malignant neoplasm of posterior wall of hypopharynx: Secondary | ICD-10-CM

## 2020-01-10 DIAGNOSIS — Z95828 Presence of other vascular implants and grafts: Secondary | ICD-10-CM

## 2020-01-10 LAB — CBC WITH DIFFERENTIAL (CANCER CENTER ONLY)
Abs Immature Granulocytes: 0.01 10*3/uL (ref 0.00–0.07)
Basophils Absolute: 0 10*3/uL (ref 0.0–0.1)
Basophils Relative: 1 %
Eosinophils Absolute: 0 10*3/uL (ref 0.0–0.5)
Eosinophils Relative: 1 %
HCT: 24.7 % — ABNORMAL LOW (ref 36.0–46.0)
Hemoglobin: 8.7 g/dL — ABNORMAL LOW (ref 12.0–15.0)
Immature Granulocytes: 1 %
Lymphocytes Relative: 10 %
Lymphs Abs: 0.2 10*3/uL — ABNORMAL LOW (ref 0.7–4.0)
MCH: 28.9 pg (ref 26.0–34.0)
MCHC: 35.2 g/dL (ref 30.0–36.0)
MCV: 82.1 fL (ref 80.0–100.0)
Monocytes Absolute: 0.3 10*3/uL (ref 0.1–1.0)
Monocytes Relative: 16 %
Neutro Abs: 1.5 10*3/uL — ABNORMAL LOW (ref 1.7–7.7)
Neutrophils Relative %: 71 %
Platelet Count: 272 10*3/uL (ref 150–400)
RBC: 3.01 MIL/uL — ABNORMAL LOW (ref 3.87–5.11)
RDW: 14.6 % (ref 11.5–15.5)
WBC Count: 2.1 10*3/uL — ABNORMAL LOW (ref 4.0–10.5)
nRBC: 0 % (ref 0.0–0.2)

## 2020-01-10 LAB — CMP (CANCER CENTER ONLY)
ALT: 8 U/L (ref 0–44)
AST: 13 U/L — ABNORMAL LOW (ref 15–41)
Albumin: 2.3 g/dL — ABNORMAL LOW (ref 3.5–5.0)
Alkaline Phosphatase: 69 U/L (ref 38–126)
Anion gap: 5 (ref 5–15)
BUN: 22 mg/dL (ref 8–23)
CO2: 26 mmol/L (ref 22–32)
Calcium: 7.2 mg/dL — ABNORMAL LOW (ref 8.9–10.3)
Chloride: 103 mmol/L (ref 98–111)
Creatinine: 1.19 mg/dL — ABNORMAL HIGH (ref 0.44–1.00)
GFR, Est AFR Am: 54 mL/min — ABNORMAL LOW (ref >60)
GFR, Estimated: 47 mL/min — ABNORMAL LOW (ref >60)
Glucose, Bld: 92 mg/dL (ref 70–99)
Potassium: 4.5 mmol/L (ref 3.5–5.1)
Sodium: 134 mmol/L — ABNORMAL LOW (ref 135–145)
Total Bilirubin: 0.3 mg/dL (ref 0.3–1.2)
Total Protein: 5.6 g/dL — ABNORMAL LOW (ref 6.5–8.1)

## 2020-01-10 MED ORDER — SODIUM CHLORIDE 0.9 % IV SOLN
Freq: Once | INTRAVENOUS | Status: AC
  Filled 2020-01-10: qty 250

## 2020-01-10 MED ORDER — SODIUM CHLORIDE 0.9% FLUSH
10.0000 mL | Freq: Once | INTRAVENOUS | Status: AC | PRN
  Administered 2020-01-10: 10 mL
  Filled 2020-01-10: qty 10

## 2020-01-10 MED ORDER — HEPARIN SOD (PORK) LOCK FLUSH 100 UNIT/ML IV SOLN
500.0000 [IU] | Freq: Once | INTRAVENOUS | Status: AC | PRN
  Administered 2020-01-10: 500 [IU]
  Filled 2020-01-10: qty 5

## 2020-01-10 MED ORDER — HYDROCODONE-ACETAMINOPHEN 7.5-325 MG/15ML PO SOLN: 15.0000 mL | Freq: Four times a day (QID) | ORAL | 0 refills | Status: DC | PRN

## 2020-01-10 NOTE — Addendum Note (Signed)
Addended by: Wyatt Portela on: 01/10/2020 08:23 AM   Modules accepted: Orders

## 2020-01-10 NOTE — Patient Instructions (Signed)

## 2020-01-10 NOTE — Progress Notes (Signed)
Hematology and Oncology Follow Up Visit  Tina Kennedy Tina Kennedy [id number] June 24, 1950 69 y.o. 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    Prior Therapy:  She is status post direct laryngoscopy and biopsy completed on October 15, 2019.  Current therapy: Chemotherapy with radiation started in November 22, 2019.  She is currently receiving cisplatin 20 mg per metered square weekly after initial dose reduction related to leukocytopenia.  She completed 6 weeks of cisplatin and receiving her last radiation treatment today.  Interim History: Tina Kennedy presents today for a follow-up visit.  Since the last visit, she has tolerated therapy reasonably fair with few complications.  She does report mucositis and mouth pain and decrease in her oral intake.  She has lost 4 pounds but for the most part still able to function reasonably well.  She denies any nausea vomiting or abdominal pain.  She denies any shortness of breath or difficulty breathing.     Medications: Updated on review. Current Outpatient Medications  Medication Sig Dispense Refill  . HYDROcodone-acetaminophen (HYCET) 7.5-325 mg/15 ml solution Take 15 mLs by mouth 4 (four) times daily as needed for moderate pain. 473 mL 0  . lidocaine (XYLOCAINE) 2 % solution Patient: Mix 1part 2% viscous lidocaine, 1part H20. Swallow 6mL of diluted mixture, 67min before meals and at bedtime, up to QID 200 mL 3  . lidocaine-prilocaine (EMLA) cream Apply 1 application topically as needed. 30 g 0  . Nutritional Supplements (FEEDING SUPPLEMENT, OSMOLITE 1.5 CAL,) LIQD Give 1 carton Osmolite 1.5 via G-tube with 60 mL free water before and after each feeding 5 times daily, 3 hours apart.  This provides greater than 90% estimated needs.  1775 cal, 74.5 g protein, 1505 mL free  water.  Please send supplies and formula. 1185 mL 6  . prochlorperazine (COMPAZINE) 10 MG tablet Take 1 tablet (10 mg total) by mouth every 6 (six) hours as needed for nausea or vomiting. (Patient not taking: Reported on 12/06/2019) 30 tablet 0   No current facility-administered medications for this visit.     Allergies: No Known Allergies    Physical Exam:  Blood pressure 135/75, pulse 80, temperature (!) 97.5 F (36.4 C), temperature source Tympanic, resp. rate 18, height 5\' 5"  (1.651 m), weight 104 lb (47.2 kg), SpO2 99 %.   ECOG: 1   General appearance: Alert, awake without any distress. Head: Atraumatic without abnormalities Oropharynx: Without any thrush or ulcers. Eyes: No scleral icterus. Lymph nodes: No lymphadenopathy noted in the cervical, supraclavicular, or axillary nodes Heart:regular rate and rhythm, without any murmurs or gallops.   Lung: Clear to auscultation without any rhonchi, wheezes or dullness to percussion. Abdomin: Soft, nontender without any shifting dullness or ascites. Musculoskeletal: No clubbing or cyanosis. Neurological: No motor or sensory deficits. Skin: No rashes or lesions.       Lab Results: Lab Results  Component Value Date   WBC 2.1 (L) 01/10/2020   HGB 8.7 (L) 01/10/2020   HCT 24.7 (L) 01/10/2020   MCV 82.1 01/10/2020   PLT 272 01/10/2020     Chemistry      Component Value Date/Time   NA 135 12/26/2019 0801   K 4.0 12/26/2019 0801   CL 96 (L) 12/26/2019 0801   CO2 29 12/26/2019 0801   BUN 14 12/26/2019 0801   CREATININE 1.03 (  H) 12/26/2019 0801      Component Value Date/Time   CALCIUM 7.4 (L) 12/26/2019 0801   ALKPHOS 77 12/26/2019 0801   AST 16 12/26/2019 0801   ALT 8 12/26/2019 0801   BILITOT 0.4 12/26/2019 0801         Impression and Plan:   69 year old woman with:  1.  Stage III squamous cell carcinoma of the right hypopharynx noted in July 2021.  She has completed therapy at this time without any  major complications.  She is experiencing expected complications that are fairly manageable at this time.  The plan is to continue with supportive care at this time and repeat imaging studies in 3 months.  2.  IV access: Port-A-Cath will remain in place and use for hydration and flush in the future.  3.  Nutrition: Her weight is down and she will have a dietary evaluation in the interim.   4.  Antiemetics: No nausea or vomiting on the current antiemetic regimen.  5.  Renal function surveillance: Kidney function remained stable at this time.  6.  Prognosis and goals of care: Therapy remains curative and aggressive measures are warranted.  7.  Neutropenia and anemia: Related to chemotherapy and anticipate recovery spontaneously.  She does not need any growth factor support or transfusion.  8.  Mouth pain: Related to current therapy.  Viscous lidocaine has not been effective.  Prescription for hydrocodone is available to her.  9.  Follow-up: Next week for IV hydration and laboratory testing and in 2 weeks for MD follow-up.  30  minutes were dedicated to this visit.  The time was spent on reviewing her disease status, addressing complications related to her cancer and cancer therapy.   Zola Button, MD 10/1/20218:16 AM

## 2020-01-14 ENCOUNTER — Telehealth: Payer: Self-pay | Admitting: Oncology

## 2020-01-14 NOTE — Telephone Encounter (Signed)
Scheduled per 10/01 los, patient has been called and notified.  

## 2020-01-17 ENCOUNTER — Inpatient Hospital Stay: Payer: Medicare Other

## 2020-01-17 ENCOUNTER — Other Ambulatory Visit: Payer: Medicare Other

## 2020-01-17 ENCOUNTER — Encounter: Payer: Self-pay | Admitting: Nutrition

## 2020-01-17 VITALS — BP 123/56 | HR 69 | Temp 98.4°F | Resp 16

## 2020-01-17 DIAGNOSIS — C139 Malignant neoplasm of hypopharynx, unspecified: Secondary | ICD-10-CM | POA: Diagnosis not present

## 2020-01-17 DIAGNOSIS — Z95828 Presence of other vascular implants and grafts: Secondary | ICD-10-CM

## 2020-01-17 LAB — CMP (CANCER CENTER ONLY)
ALT: 12 U/L (ref 0–44)
AST: 16 U/L (ref 15–41)
Albumin: 2.4 g/dL — ABNORMAL LOW (ref 3.5–5.0)
Alkaline Phosphatase: 73 U/L (ref 38–126)
Anion gap: 3 — ABNORMAL LOW (ref 5–15)
BUN: 24 mg/dL — ABNORMAL HIGH (ref 8–23)
CO2: 29 mmol/L (ref 22–32)
Calcium: 8.5 mg/dL — ABNORMAL LOW (ref 8.9–10.3)
Chloride: 104 mmol/L (ref 98–111)
Creatinine: 1.32 mg/dL — ABNORMAL HIGH (ref 0.44–1.00)
GFR, Estimated: 41 mL/min — ABNORMAL LOW (ref >60)
Glucose, Bld: 76 mg/dL (ref 70–99)
Potassium: 5 mmol/L (ref 3.5–5.1)
Sodium: 136 mmol/L (ref 135–145)
Total Bilirubin: 0.5 mg/dL (ref 0.3–1.2)
Total Protein: 6.1 g/dL — ABNORMAL LOW (ref 6.5–8.1)

## 2020-01-17 LAB — CBC WITH DIFFERENTIAL (CANCER CENTER ONLY)
Abs Immature Granulocytes: 0.01 10*3/uL (ref 0.00–0.07)
Basophils Absolute: 0 10*3/uL (ref 0.0–0.1)
Basophils Relative: 1 %
Eosinophils Absolute: 0 10*3/uL (ref 0.0–0.5)
Eosinophils Relative: 1 %
HCT: 24.6 % — ABNORMAL LOW (ref 36.0–46.0)
Hemoglobin: 8.6 g/dL — ABNORMAL LOW (ref 12.0–15.0)
Immature Granulocytes: 1 %
Lymphocytes Relative: 16 %
Lymphs Abs: 0.3 10*3/uL — ABNORMAL LOW (ref 0.7–4.0)
MCH: 29.4 pg (ref 26.0–34.0)
MCHC: 35 g/dL (ref 30.0–36.0)
MCV: 84 fL (ref 80.0–100.0)
Monocytes Absolute: 0.4 10*3/uL (ref 0.1–1.0)
Monocytes Relative: 21 %
Neutro Abs: 1.2 10*3/uL — ABNORMAL LOW (ref 1.7–7.7)
Neutrophils Relative %: 60 %
Platelet Count: 292 10*3/uL (ref 150–400)
RBC: 2.93 MIL/uL — ABNORMAL LOW (ref 3.87–5.11)
RDW: 15.9 % — ABNORMAL HIGH (ref 11.5–15.5)
WBC Count: 1.9 10*3/uL — ABNORMAL LOW (ref 4.0–10.5)
nRBC: 0 % (ref 0.0–0.2)

## 2020-01-17 MED ORDER — HEPARIN SOD (PORK) LOCK FLUSH 100 UNIT/ML IV SOLN
500.0000 [IU] | Freq: Once | INTRAVENOUS | Status: AC | PRN
  Administered 2020-01-17: 500 [IU]
  Filled 2020-01-17: qty 5

## 2020-01-17 MED ORDER — SODIUM CHLORIDE 0.9% FLUSH
10.0000 mL | Freq: Once | INTRAVENOUS | Status: AC | PRN
  Administered 2020-01-17: 10 mL
  Filled 2020-01-17: qty 10

## 2020-01-17 MED ORDER — SODIUM CHLORIDE 0.9 % IV SOLN
Freq: Once | INTRAVENOUS | Status: AC
  Filled 2020-01-17: qty 250

## 2020-01-17 NOTE — Patient Instructions (Signed)

## 2020-01-17 NOTE — Progress Notes (Signed)
Patient arrived in patient and family support this morning reporting she did not have any tube feeding.   I provided her with 2 complementary cases of Osmolite 1.5 until her tube feeding is delivered.  I have contacted [employer] and communicated delivery issue.  Unfortunately, they are waiting on tube feeding orders to be signed prior to delivering formula.  Spoke with RN who will get Dr.'s signature and fax order to [employer].  She was confused and thought she was scheduled to see Dr. Isidore Moos today.  Patient was scheduled for labs, port flush, and IV fluids later this afternoon.  Infusion room was able to work her in early and patient was escorted upstairs to the waiting area.

## 2020-01-24 ENCOUNTER — Inpatient Hospital Stay: Payer: Medicare Other

## 2020-01-24 ENCOUNTER — Telehealth: Payer: Self-pay

## 2020-01-24 ENCOUNTER — Inpatient Hospital Stay: Payer: Medicare Other | Admitting: Oncology

## 2020-01-24 ENCOUNTER — Ambulatory Visit
Admission: RE | Admit: 2020-01-24 | Discharge: 2020-01-24 | Disposition: A | Discharge status: Home / Self Care | Payer: Medicare Other | Source: Ambulatory Visit | Attending: Radiation Oncology | Admitting: Radiation Oncology

## 2020-01-24 NOTE — Telephone Encounter (Signed)
Called patient and left voicemail to see if she was still planning on coming to F/U appointment with Dr. Isidore Moos this morning (or her MedOn[facility] appointments later today). Asked for return call with update, and provided direct call-back number.

## 2020-01-27 ENCOUNTER — Telehealth: Payer: Self-pay | Admitting: Nutrition

## 2020-01-27 ENCOUNTER — Telehealth: Payer: Self-pay | Admitting: *Deleted

## 2020-01-27 NOTE — Telephone Encounter (Signed)
LVM for call back to reschedule follow up appointment with Dr. Isidore Moos.

## 2020-01-27 NOTE — Telephone Encounter (Signed)
Scheduled per 10/18 los. Unable to reach pt. Lefty voicemail. Rescheduled appt on 10/19 with nutrition to 10/26. Pamala Hurry is out of office on 10/19. Left voicemail with appt time and date.

## 2020-01-28 ENCOUNTER — Encounter: Payer: Medicare Other | Admitting: Nutrition

## 2020-01-28 NOTE — Progress Notes (Signed)
Oncology Nurse Navigator Documentation  I have called and left a voicemail with Ms. Allnutt regarding her upcoming appointment with Garald Balding SLP at [facility] on 01/30/20. I have asked her to return my call with the number I provided to confirm her attendance at that appointment. I also asked her to call back concerning her missed follow up with Dr. Isidore Moos and Dr. Alen Blew last week to get them rescheduled.   Harlow Asa RN, BSN, OCN Head & Neck Oncology Nurse [place] at [facility] Phone # [phone]  Fax # [phone]

## 2020-01-30 ENCOUNTER — Other Ambulatory Visit: Payer: Self-pay

## 2020-01-30 ENCOUNTER — Ambulatory Visit: Payer: Medicare Other | Attending: Radiation Oncology

## 2020-01-30 ENCOUNTER — Telehealth: Payer: Self-pay

## 2020-01-30 DIAGNOSIS — R1310 Dysphagia, unspecified: Secondary | ICD-10-CM | POA: Insufficient documentation

## 2020-01-30 NOTE — Therapy (Signed)
[facility] [address] [place], [place], [address] Phone: [phone]   Fax:  [phone]  Speech Language Pathology Treatment  Patient Details  Name: Tina Kennedy MRN: [id number] Date of Birth: 09/20/1950 Referring Provider (SLP): Eppie Gibson MD   Encounter Date: 01/30/2020   End of Session - 01/30/20 1626    Visit Number 3    Number of Visits 7    Date for SLP Re-Evaluation 02/26/20    SLP Start Time 1520    SLP Stop Time  6754    SLP Time Calculation (min) 37 min    Activity Tolerance Patient tolerated treatment well           Past Medical History:  Diagnosis Date  . Hypopharyngeal cancer [facility])     Past Surgical History:  Procedure Laterality Date  . DIRECT LARYNGOSCOPY N/A 10/15/2019   Procedure: DIRECT LARYNGOSCOPY w/BIOPSY;  Surgeon: Izora Gala, MD;  Location: [facility];  Service: ENT;  Laterality: N/A;  . GASTROSTOMY N/A 12/04/2019   Procedure: LAPAROSCOPIC G TUBE PLACEMENT;  Surgeon: Ralene Ok, MD;  Location: [facility];  Service: General;  Laterality: N/A;  . IR GASTROSTOMY TUBE MOD SED  11/15/2019  . IR IMAGING GUIDED PORT INSERTION  11/15/2019  . RIGID ESOPHAGOSCOPY N/A 10/15/2019   Procedure: RIGID ESOPHAGOSCOPY;  Surgeon: Izora Gala, MD;  Location: [facility];  Service: ENT;  Laterality: N/A;    There were no vitals filed for this visit.   Subjective Assessment - 01/30/20 1519    Subjective Pt voice is clear today upon entry to tx room.    Currently in Pain? No/denies                 ADULT SLP TREATMENT - 01/30/20 1520      General Information   Behavior/Cognition Alert;Cooperative;Pleasant mood      Treatment Provided   Treatment provided Dysphagia      Dysphagia Treatment   Temperature Spikes Noted No    Respiratory Status Room air    Oral Cavity - Dentition Edentulous    Treatment Methods Skilled observation;Therapeutic exercise;Patient/caregiver  education;Compensation strategy training    Patient observed directly with PO's Yes    Type of PO's observed Dysphagia 3 (soft);Thin liquids    Liquids provided via Cup    Oral Phase Signs & Symptoms --   none noted   Pharyngeal Phase Signs & Symptoms --   None noted   Other treatment/comments Pt reports she has been doing some of the exercises most days a week (suboptimal scope and frequency). SLP reiterated pt to do all exercises with suggested reps BID for best success. Pt req'd occasional min-mod A today initially, faded to modified independence. Pt told SLP rationale for HEP with min A from SLP. SLP encouragd pt to eat 3-4 bites more than what she feels she can in order to combat muscle disuse atrophy which SLP reviewed with pt as well.       Assessment / Recommendations / Plan   Plan Continue with current plan of care      Progression Toward Goals   Progression toward goals --   HEP compliance improved but not optimal           SLP Education - 01/30/20 1626    Education Details muscle disuse atrophy, HEP procedure, rationale for HEP    Person(s) Educated Patient    Methods Explanation;Demonstration;Verbal cues    Comprehension Verbalized understanding;Returned demonstration;Need further instruction  SLP Short Term Goals - 01/30/20 1629      SLP SHORT TERM GOAL #1   Title pt will complete HEP with rare min A    Period --   sessions, for all STGs   Status Partially Met      SLP SHORT TERM GOAL #2   Title pt will tell SLP why pt is completing HEP with modified independence    Status Not Met      SLP SHORT TERM GOAL #3   Title pt will describe 3 overt s/s aspiration PNA with modified independence    Status Not Met   added to [place] #4   Title pt will tell SLP how a food journal could hasten return to a more normalized diet    Time 1    Status On-going            SLP Long Term Goals - 01/30/20 1630      SLP LONG TERM GOAL #1    Title pt will complete HEP with modified independence over two visits    Time 2    Period --   sessions, for all LTGs   Status On-going      SLP LONG TERM GOAL #2   Title pt will describe how to modify HEP over time, and the timeline associated with reduction in HEP frequency with modified independence over two sessions    Time 4    Status On-going      SLP LONG TERM GOAL #3   Title pt will tell SLP 3 overt s/sx aspiration PNA with modified independence    Time 1    Status New            Plan - 01/30/20 1626    Clinical Impression Statement Jax's swallowing remains WNL with thin and dys III item (lunch meat -ham). No overt s/sx aspiration PNA today, nor any reported to SLP by pt. Pt without "wet" voice today. Pt cont with laparoscopic G-tube placement, is attempting to eat more POs but is hindered by ageusia. SLP strongly encouraged pt today to have a few more bites after she thinks she is done, to combat muscle disuse atrophy. SLP reviewed  individualized HEP for dysphagia and pt completed each exercise on their own with occasional min-mod A with faded cues  to modified indpendent. Data indicate that pt's swallow ability will likely decrease over the course of radiation therapy and could very well decline over time following conclusion of their radiation therapy due to muscle disuse atrophy and/or muscle fibrosis. Pt will cont to need to be seen by SLP in order to assess safety of PO intake, assess the need for recommending any objective swallow assessment, and ensuring pt correctly completes the individualized HEP.    Speech Therapy Frequency --   once approx every 4 weeks   Duration --   7 total visits   Treatment/Interventions Aspiration precaution training;Pharyngeal strengthening exercises;Diet toleration management by SLP;Trials of upgraded texture/liquids;Patient/family education;SLP instruction and feedback;Compensatory strategies    Potential to Achieve Goals Fair    Potential  Considerations Severity of impairments    SLP Home Exercise Plan provided today    Consulted and Agree with Plan of Care Patient           Patient will benefit from skilled therapeutic intervention in order to improve the following deficits and impairments:   Dysphagia, unspecified type    Problem List Patient Active Problem List  Diagnosis Date Noted  . Port-A-Cath in place 12/26/2019  . 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 Goals of care, counseling/discussion 10/31/2019  . Hypopharyngeal cancer ([place]) 10/28/2019    [facility] ,[place], [place]  01/30/2020, 4:31 PM  [facility] [address] [place], [place], [address] Phone: [phone]   Fax:  [phone]   Name: Mileidy Atkin MRN: [id number] Date of Birth: 10-03-1950

## 2020-01-30 NOTE — Telephone Encounter (Signed)
-----   Message from Wyatt Portela, MD sent at 01/30/2020  3:49 PM EDT ----- I will take care of it. Thanks ----- Message ----- From: Kennedy Bucker, LPN Sent: [id number]   3:43 PM EDT To: Wyatt Portela, MD  The head and neck navigator called about patient "no showing" all of her appointment's last week. Would you like me to send a scheduling message to reschedule the patient's appointment with you.   Kim LPN

## 2020-01-30 NOTE — Telephone Encounter (Signed)
Returned call to [facility] and left message per Dr Alen Blew.

## 2020-01-31 ENCOUNTER — Other Ambulatory Visit: Payer: Self-pay

## 2020-01-31 ENCOUNTER — Ambulatory Visit
Admission: RE | Admit: 2020-01-31 | Discharge: 2020-01-31 | Disposition: A | Discharge status: Home / Self Care | Payer: Medicare Other | Source: Ambulatory Visit | Attending: Radiation Oncology | Admitting: Radiation Oncology

## 2020-01-31 VITALS — BP 101/58 | HR 73 | Temp 97.1°F | Resp 20 | Ht 65.0 in | Wt 102.0 lb

## 2020-01-31 DIAGNOSIS — C138 Malignant neoplasm of overlapping sites of hypopharynx: Secondary | ICD-10-CM | POA: Diagnosis not present

## 2020-01-31 DIAGNOSIS — C139 Malignant neoplasm of hypopharynx, unspecified: Secondary | ICD-10-CM

## 2020-01-31 DIAGNOSIS — C132 Malignant neoplasm of posterior wall of hypopharynx: Secondary | ICD-10-CM

## 2020-01-31 NOTE — Progress Notes (Signed)
Tina Kennedy presents today for 2 week follow up of radiation to her hypopharynx completed on 01/10/2020  Pain issues, if any: Denies any throat pain, but does have some pain in her left foot after falling and dropping a case of Osmolite on her foot. She reports it's occasionally difficult to walk on, but ultimately doesn't limit her mobility. She reports she can move her first 2 toes, but struggles to get the last 3 to move. Using a feeding tube?: Yes--uses ~4 times a day without difficulty Weight changes, if any:  Wt Readings from Last 3 Encounters:  01/31/20 102 lb (46.3 kg)  01/10/20 104 lb (47.2 kg)  12/26/19 107 lb 8 oz (48.8 kg)   Swallowing issues, if any: Patient denies. She is already eating a wide variety of foods (porkshop sandwich, vegetables, chicken, etc)--she just cuts everything into small bites.  Smoking or chewing tobacco? Smokes ~2 cigarettes a day (1 in morning and 1 in evening) Using fluoride trays daily? N/A Last ENT visit was on: Not since diagnosis Other notable issues, if any: Still has no sense of taste which bothers her. Continues to deal with thick phlegm that she has to clear from her throat (but reports it's better than before she started treatment). Skin in treatment field is healing well, and patient denies any symptoms of lymphedema. Denies any ear/jaw pain, or difficulty opening/closing her mouth. Overall she's doing well and pleased with her progress thus far.  Vitals:   01/31/20 1051  BP: (!) 101/58  Pulse: 73  Resp: 20  Temp: (!) 97.1 F (36.2 C)  SpO2: 100%

## 2020-01-31 NOTE — Progress Notes (Signed)
Radiation Oncology         [phone]) [phone] ________________________________  Name: Tina Kennedy MRN: [id number]  Date: 01/31/2020  DOB: 12/16/1950  Follow-Up Visit Note  CC: Patient, No Pcp Per  Tina Gala, MD  Diagnosis and Prior Radiotherapy:       ICD-10-CM   1. 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    Tina Kennedy presents today for 2 week follow up of radiation to her hypopharynx completed on 01/10/2020; she did not show for her initial follow-up because she was out of town with her sister.  However, she had a great time and is doing well.  Pain issues, if any: Denies any throat pain, but does have some pain in her left foot after falling and dropping a case of Osmolite on her foot. She reports it's occasionally difficult to walk on, but ultimately doesn't limit her mobility. She reports she can move her first 2 toes, but struggles to get the last 3 to move. Using a feeding tube?: Yes--uses ~4 times a day without difficulty Weight changes, if any:  Wt Readings from Last 3 Encounters:  01/31/20 102 lb (46.3 kg)  01/10/20 104 lb (47.2 kg)  12/26/19 107 lb 8 oz (48.8 kg)   Swallowing issues, if any: Patient denies. She is already eating a wide variety of foods (porkshop sandwich, vegetables, chicken, etc)--she just cuts everything into small bites.  Smoking or chewing tobacco? Smokes ~2 cigarettes a day (1 in morning and 1 in evening) Using fluoride trays daily? N/A Last ENT visit was on: Not since diagnosis Other notable issues, if any: Still has no sense of taste which  bothers her. Continues to deal with thick phlegm that she has to clear from her throat (but reports it's better than before she started treatment). Skin in treatment field is healing well, and patient denies any symptoms of lymphedema. Denies any ear/jaw pain, or difficulty opening/closing her mouth. Overall she's doing well and pleased with her progress thus far.  Vitals:   01/31/20 1051  BP: (!) 101/58  Pulse: 73  Resp: 20  Temp: (!) 97.1 F (36.2 C)  SpO2: 100%                     ALLERGIES:  has No Known Allergies.  Meds: Current Outpatient Medications  Medication Sig Dispense Refill  . HYDROcodone-acetaminophen (HYCET) 7.5-325 mg/15 ml solution Take 15 mLs by mouth 4 (four) times daily as needed for moderate pain. 473 mL 0  . lidocaine (XYLOCAINE) 2 % solution Patient: Mix 1part 2% viscous lidocaine, 1part H20. Swallow 100mL of diluted mixture, 14min before meals and at bedtime, up to QID 200 mL 3  . lidocaine-prilocaine (EMLA) cream Apply 1 application topically as needed. 30 g 0  . Nutritional Supplements (FEEDING SUPPLEMENT, OSMOLITE 1.5 CAL,) LIQD Give 1 carton Osmolite 1.5 via G-tube with 60 mL free water before and after each feeding 5 times daily, 3 hours apart.  This provides greater than 90% estimated needs.  1775 cal, 74.5 g protein, 1505 mL free water.  Please send supplies  and formula. 1185 mL 6  . prochlorperazine (COMPAZINE) 10 MG tablet Take 1 tablet (10 mg total) by mouth every 6 (six) hours as needed for nausea or vomiting. (Patient not taking: Reported on 12/06/2019) 30 tablet 0   No current facility-administered medications for this encounter.    Physical Findings: The patient is in no acute distress. Patient is alert and oriented. Wt Readings from Last 3 Encounters:  01/31/20 102 lb (46.3 kg)  01/10/20 104 lb (47.2 kg)  12/26/19 107 lb 8 oz (48.8 kg)    height is 5\' 5"  (1.651 m) and weight is 102 lb (46.3 kg). Her temporal temperature is 97.1 F (36.2 C)  (abnormal). Her blood pressure is 101/58 (abnormal) and her pulse is 73. Her respiration is 20 and oxygen saturation is 100%. .  General: Alert and oriented, in no acute distress HEENT: Head is normocephalic.  Oropharynx is notable for no oral lesions or upper throat lesions, no thrush Neck: Neck is notable for no palpable adenopathy Skin: Skin in treatment fields shows satisfactory healing  Extremities: No cyanosis or edema.  She is able to wiggle all the toes on her left foot and does not demonstrate any excessive swelling nor any bruising. Lymphatics: see Neck Exam Psychiatric: Judgment and insight are intact. Affect is appropriate.   Lab Findings: Lab Results  Component Value Date   WBC 1.9 (L) 01/17/2020   HGB 8.6 (L) 01/17/2020   HCT 24.6 (L) 01/17/2020   MCV 84.0 01/17/2020   PLT 292 01/17/2020    Lab Results  Component Value Date   TSH 0.658 11/20/2019    Radiographic Findings: No results found.  Impression/Plan:    1) Head and Neck Cancer Status: Healing well from radiotherapy  2) Nutritional Status: Slight weight loss, but she is eating well and supplementing with her PEG tube PEG tube: As above  3) Risk Factors: The patient has been educated about risk factors including alcohol and tobacco abuse; they understand that avoidance of alcohol and tobacco is important to prevent recurrences as well as other cancers; she is still smoking and I again encouraged her to quit altogether to improve her prognosis.  4) Swallowing: Functional, has improved since completing therapy  5)  Thyroid function: Check annually Lab Results  Component Value Date   TSH 0.658 11/20/2019    6) Follow-up in approximately 2-1/2 months with restaging PET scan. The patient was encouraged to call with any issues or questions before then.  The patient was lost to follow-up with medical oncology but we are working on getting her rescheduled.  On date of service, in total, I spent 25 minutes on  this encounter. Patient was seen in person. _____________________________________   Eppie Gibson, MD

## 2020-02-03 ENCOUNTER — Telehealth: Payer: Self-pay | Admitting: Nutrition

## 2020-02-03 ENCOUNTER — Telehealth: Payer: Self-pay | Admitting: Oncology

## 2020-02-03 NOTE — Telephone Encounter (Signed)
Scheduled per 10/21 scheduled message, patient has been called and notified.

## 2020-02-04 ENCOUNTER — Encounter: Payer: Self-pay | Admitting: Radiation Oncology

## 2020-02-04 ENCOUNTER — Other Ambulatory Visit: Payer: Self-pay

## 2020-02-04 ENCOUNTER — Inpatient Hospital Stay: Payer: Medicare Other

## 2020-02-04 ENCOUNTER — Inpatient Hospital Stay (HOSPITAL_BASED_OUTPATIENT_CLINIC_OR_DEPARTMENT_OTHER): Payer: Medicare Other | Admitting: Oncology

## 2020-02-04 ENCOUNTER — Inpatient Hospital Stay: Payer: Medicare Other | Admitting: Nutrition

## 2020-02-04 VITALS — BP 119/71 | HR 68 | Temp 96.3°F | Resp 18 | Ht 65.0 in | Wt 103.9 lb

## 2020-02-04 DIAGNOSIS — C132 Malignant neoplasm of posterior wall of hypopharynx: Secondary | ICD-10-CM

## 2020-02-04 DIAGNOSIS — Z95828 Presence of other vascular implants and grafts: Secondary | ICD-10-CM

## 2020-02-04 DIAGNOSIS — C139 Malignant neoplasm of hypopharynx, unspecified: Secondary | ICD-10-CM

## 2020-02-04 LAB — CBC WITH DIFFERENTIAL (CANCER CENTER ONLY)
Abs Immature Granulocytes: 0.01 10*3/uL (ref 0.00–0.07)
Basophils Absolute: 0 10*3/uL (ref 0.0–0.1)
Basophils Relative: 0 %
Eosinophils Absolute: 0 10*3/uL (ref 0.0–0.5)
Eosinophils Relative: 2 %
HCT: 22 % — ABNORMAL LOW (ref 36.0–46.0)
Hemoglobin: 7.8 g/dL — ABNORMAL LOW (ref 12.0–15.0)
Immature Granulocytes: 0 %
Lymphocytes Relative: 17 %
Lymphs Abs: 0.5 10*3/uL — ABNORMAL LOW (ref 0.7–4.0)
MCH: 30.8 pg (ref 26.0–34.0)
MCHC: 35.5 g/dL (ref 30.0–36.0)
MCV: 87 fL (ref 80.0–100.0)
Monocytes Absolute: 0.4 10*3/uL (ref 0.1–1.0)
Monocytes Relative: 14 %
Neutro Abs: 1.8 10*3/uL (ref 1.7–7.7)
Neutrophils Relative %: 67 %
Platelet Count: 308 10*3/uL (ref 150–400)
RBC: 2.53 MIL/uL — ABNORMAL LOW (ref 3.87–5.11)
RDW: 18.1 % — ABNORMAL HIGH (ref 11.5–15.5)
WBC Count: 2.7 10*3/uL — ABNORMAL LOW (ref 4.0–10.5)
nRBC: 0 % (ref 0.0–0.2)

## 2020-02-04 LAB — CMP (CANCER CENTER ONLY)
ALT: 10 U/L (ref 0–44)
AST: 14 U/L — ABNORMAL LOW (ref 15–41)
Albumin: 2.4 g/dL — ABNORMAL LOW (ref 3.5–5.0)
Alkaline Phosphatase: 91 U/L (ref 38–126)
Anion gap: 3 — ABNORMAL LOW (ref 5–15)
BUN: 18 mg/dL (ref 8–23)
CO2: 25 mmol/L (ref 22–32)
Calcium: 8.2 mg/dL — ABNORMAL LOW (ref 8.9–10.3)
Chloride: 103 mmol/L (ref 98–111)
Creatinine: 1.18 mg/dL — ABNORMAL HIGH (ref 0.44–1.00)
GFR, Estimated: 50 mL/min — ABNORMAL LOW (ref >60)
Glucose, Bld: 100 mg/dL — ABNORMAL HIGH (ref 70–99)
Potassium: 4.4 mmol/L (ref 3.5–5.1)
Sodium: 131 mmol/L — ABNORMAL LOW (ref 135–145)
Total Bilirubin: 0.4 mg/dL (ref 0.3–1.2)
Total Protein: 5.7 g/dL — ABNORMAL LOW (ref 6.5–8.1)

## 2020-02-04 MED ORDER — SODIUM CHLORIDE 0.9% FLUSH
10.0000 mL | Freq: Once | INTRAVENOUS | Status: AC
  Administered 2020-02-04: 10 mL
  Filled 2020-02-04: qty 10

## 2020-02-04 MED ORDER — HEPARIN SOD (PORK) LOCK FLUSH 100 UNIT/ML IV SOLN
500.0000 [IU] | Freq: Once | INTRAVENOUS | Status: AC
  Administered 2020-02-04: 500 [IU]
  Filled 2020-02-04: qty 5

## 2020-02-04 NOTE — Patient Instructions (Signed)

## 2020-02-04 NOTE — Progress Notes (Signed)
Hematology and Oncology Follow Up Visit  [place] [id number] 1950/04/21 69 y.o. 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  She presented with stage III disease at the time of diagnosis.   Prior Therapy:  She is status post direct laryngoscopy and biopsy completed on October 15, 2019.  Chemotherapy with radiation started in November 22, 2019.  She received cisplatin chemotherapy concomitantly with radiation for a total of 6 cycles completed on December 26, 2019.   Current therapy: Active surveillance and supportive management.   Interim History: Ms. Arriaga returns today for a follow-up visit.  Since her last visit, she reports a continuous improvement after finishing treatment.  She denies any nausea, vomiting or abdominal discomfort.  She is requiring little to no pain medication at this time.  She is able to maintain nutrition predominantly via mouth although feeding tube still in use.  She denies excessive fatigue, tiredness or dyspnea on exertion.    Medications: Reviewed without changes. Current Outpatient Medications  Medication Sig Dispense Refill  . HYDROcodone-acetaminophen (HYCET) 7.5-325 mg/15 ml solution Take 15 mLs by mouth 4 (four) times daily as needed for moderate pain. 473 mL 0  . lidocaine (XYLOCAINE) 2 % solution Patient: Mix 1part 2% viscous lidocaine, 1part H20. Swallow 4mL of diluted mixture, 42min before meals and at bedtime, up to QID 200 mL 3  . lidocaine-prilocaine (EMLA) cream Apply 1 application topically as needed. 30 g 0  . Nutritional Supplements (FEEDING SUPPLEMENT, OSMOLITE 1.5 CAL,) LIQD Give 1 carton Osmolite 1.5 via G-tube with 60 mL free water before and after each feeding 5 times daily, 3 hours apart.  This provides greater than 90% estimated needs.  1775 cal, 74.5 g protein, 1505 mL free water.  Please  send supplies and formula. 1185 mL 6  . prochlorperazine (COMPAZINE) 10 MG tablet Take 1 tablet (10 mg total) by mouth every 6 (six) hours as needed for nausea or vomiting. (Patient not taking: Reported on 12/06/2019) 30 tablet 0   No current facility-administered medications for this visit.     Allergies: No Known Allergies    Physical Exam:  Blood pressure 119/71, pulse 68, temperature (!) 96.3 F (35.7 C), temperature source Tympanic, resp. rate 18, height 5\' 5"  (1.651 m), weight 103 lb 14.4 oz (47.1 kg), SpO2 98 %.    ECOG: 1    General appearance: Comfortable appearing without any discomfort Head: Normocephalic without any trauma Oropharynx: Mucous membranes are moist and pink without any thrush or ulcers. Eyes: Pupils are equal and round reactive to light. Lymph nodes: No cervical, supraclavicular, inguinal or axillary lymphadenopathy.   Heart:regular rate and rhythm.  S1 and S2 without leg edema. Lung: Clear without any rhonchi or wheezes.  No dullness to percussion. Abdomin: Soft, nontender, nondistended with good bowel sounds.  No hepatosplenomegaly. Musculoskeletal: No joint deformity or effusion.  Full range of motion noted. Neurological: No deficits noted on motor, sensory and deep tendon reflex exam. Skin: No petechial rash or dryness.  Appeared moist.         Lab Results: Lab Results  Component Value Date   WBC 1.9 (L) 01/17/2020   HGB 8.6 (L) 01/17/2020   HCT 24.6 (L) 01/17/2020   MCV 84.0 01/17/2020   PLT 292 01/17/2020     Chemistry      Component Value Date/Time   NA 136 01/17/2020 1017  K 5.0 01/17/2020 1017   CL 104 01/17/2020 1017   CO2 29 01/17/2020 1017   BUN 24 (H) 01/17/2020 1017   CREATININE 1.32 (H) 01/17/2020 1017      Component Value Date/Time   CALCIUM 8.5 (L) 01/17/2020 1017   ALKPHOS 73 01/17/2020 1017   AST 16 01/17/2020 1017   ALT 12 01/17/2020 1017   BILITOT 0.5 01/17/2020 1017         Impression and  Plan:   69 year old woman with:  1.  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  She was found to have stage III at that time.  He is currently on active surveillance after completing definitive therapy without any residual major complaints.  The natural course of her disease were reviewed and risk of relapse was assessed.  Plan is to repeat imaging studies in December for staging purposes.  2.  IV access: Port-A-Cath currently in place and will be flushed periodically.  3.  Nutrition: She continues to do well from a nutrition standpoint with feeding tube remain in place.   4.  Antiemetics: No residual nausea or vomiting reported.  5.  Renal function surveillance: We will continue to monitor kidney function with very little change noted on platinum based therapy.  6.  Prognosis and goals of care: Therapy is curative and aggressive measures are warranted.  7.  Cytopenia: Her white cell count is recovering appropriately with absolute neutrophil count is 1800.  Her hemoglobin drifted slightly currently at 7.8.  Recommended continued monitoring for the time being.  8.  Mouth pain: Improved at this time.  9.  Follow-up: will continue to follow periodically for supportive management standpoint.  30  minutes were spent on this encounter.  Time was dedicated to reviewing her disease status, address complications related to therapy and future plan of care review.   Zola Button, MD 10/26/20218:36 AM

## 2020-02-04 NOTE — Progress Notes (Signed)
Telephone follow up completed with patient. Patient s/p radiation for Hypopharyngeal cancer. Wt documented as 103.9 pounds on Oct 26, stable from 104.8 pounds Sept 27. Reports giving 4 cartons of Osmolite 1.5 via PEG daily with 60 mL free water before and after bolus feeding. This provides 1420 kcal and 59.6 gm protein. She reports she is eating by mouth and is tolerating cabbage, okra, hush puppies and chocolate milk. She also eats at breakfast and dinner but does not provide details. Denies nutrition impact symptoms. Reports she had 3 cases of Osmolite 1.5 delivered recently. Denies needs at this time.  Estimated Nutrition Needs: 1700-2000 kcal, 75-100 gm protein, 2.2 L fluid.  Intervention: Continue 4 cartons of Osmolite 1.5 daily via tube. Increase oral intake at each meal and focus on high calorie high protein foods. Patient will contact [facility] for reordering tube feeding supplies.  Monitoring, Evaluation, Goals: Increase calories and protein to minimize wt loss and wean off tube feeding.  Next Visit: telephone f/u on Wed, Nov 24.

## 2020-02-04 NOTE — Progress Notes (Signed)
  Patient Name: Tina Kennedy MRN: [id number] DOB: 01-Jun-1950 Referring Physician: Izora Gala (Profile Not Attached) Date of Service: 01/10/2020 [facility], [place]                                                        End Of Treatment Note  Diagnoses: C13.8-Malignant neoplasm of overlapping sites of hypopharynx  Cancer Staging: Cancer Staging Hypopharyngeal cancer [facility]) Staging form: Pharynx - Hypopharynx, AJCC 8th Edition - Clinical: Stage IVA (cT4a, cN2a, cM0) - Signed by Eppie Gibson, MD on 11/16/2019   Intent: Curative  Radiation Treatment Dates: 11/20/2019 through 01/10/2020 Site Technique Total Dose (Gy) Dose per Fx (Gy) Completed Fx Beam Energies  Pharynx: HN_HypoPha IMRT 70/70 2 35/35 6X   Narrative: The patient tolerated radiation therapy relatively well.   Plan: The patient will follow-up with radiation oncology in 2 weeks.  -----------------------------------  Eppie Gibson, MD

## 2020-02-04 NOTE — Addendum Note (Signed)
Addended by: Delorise Jackson on: 02/04/2020 09:24 AM   Modules accepted: Orders

## 2020-02-27 ENCOUNTER — Ambulatory Visit: Payer: Medicare Other | Attending: Radiation Oncology

## 2020-02-27 ENCOUNTER — Other Ambulatory Visit: Payer: Self-pay

## 2020-02-27 DIAGNOSIS — R1310 Dysphagia, unspecified: Secondary | ICD-10-CM | POA: Insufficient documentation

## 2020-02-27 NOTE — Therapy (Signed)
[facility] [address] [place], [place], [address] Phone: [phone]   Fax:  [phone]  Speech Language Pathology Treatment/Renewal Summary  Patient Details  Name: Tina Kennedy MRN: [id number] Date of Birth: March 21, 1951 Referring Provider (SLP): Eppie Gibson MD   Encounter Date: 02/27/2020   End of Session - 02/27/20 1632    Visit Number 4    Number of Visits 7    Date for SLP Re-Evaluation 05/08/20    SLP Start Time 1517    SLP Stop Time  1605    SLP Time Calculation (min) 32 min    Activity Tolerance Patient tolerated treatment well           Past Medical History:  Diagnosis Date   Hypopharyngeal cancer [facility])     Past Surgical History:  Procedure Laterality Date   DIRECT LARYNGOSCOPY N/A 10/15/2019   Procedure: DIRECT LARYNGOSCOPY w/BIOPSY;  Surgeon: Izora Gala, MD;  Location: [facility];  Service: ENT;  Laterality: N/A;   GASTROSTOMY N/A 12/04/2019   Procedure: LAPAROSCOPIC G TUBE PLACEMENT;  Surgeon: Ralene Ok, MD;  Location: [facility];  Service: General;  Laterality: N/A;   IR GASTROSTOMY TUBE MOD SED  11/15/2019   IR IMAGING GUIDED PORT INSERTION  11/15/2019   RIGID ESOPHAGOSCOPY N/A 10/15/2019   Procedure: RIGID ESOPHAGOSCOPY;  Surgeon: Izora Gala, MD;  Location: [facility];  Service: ENT;  Laterality: N/A;    There were no vitals filed for this visit.   Subjective Assessment - 02/27/20 1539    Subjective "slaw, eggs, grits, fish, oatmeal." (pt, re: some of what she has eaten in last 36 hours)   Currently in Pain? No/denies                 ADULT SLP TREATMENT - 02/27/20 1539      General Information   Behavior/Cognition Alert;Cooperative;Pleasant mood      Treatment Provided   Treatment provided Dysphagia      Dysphagia Treatment   Temperature Spikes Noted No    Respiratory Status Room air    Oral Cavity - Dentition Edentulous    Treatment Methods Skilled  observation;Therapeutic exercise;Patient/caregiver education;Compensation strategy training    Patient observed directly with PO's Yes    Type of PO's observed Dysphagia 3 (soft);Thin liquids    Liquids provided via Cup    Oral Phase Signs & Symptoms --   none noted   Pharyngeal Phase Signs & Symptoms --   none noted   Other treatment/comments Pt indicated she did not wnat to use a food journal but SLP educated her on this anyway - she told SLP how someone could benefit from this. SLP told pt to omit [place] due to pt fear of feeding tube coming out. SLP told pt that it shouldn't happen but if pt is uncomfortable than it is alright to omit. Pt admitted to doing HEP "when I think about it" which she stated was < once/day. SLP reiterated pt will need to perform BID for best opportunity to maintain WNL swallowing function. She req'd min-mod A occasionally with HEP procedure. SLP provided pt with overt s/sx of aspiraiton PNA and pt told SLP three of these with modified independent.       Assessment / Recommendations / Plan   Plan --   decr to x1 every other month due to progress/freq HEP     Progression Toward Goals   Progression toward goals --   HEP frequency consistently completed less than  optimal           SLP Education - 02/27/20 1631    Education Details do HEP BID for best opportunity for maintaining WNL swallowing, HEP procedure, late effects radiation on swallowing    Person(s) Educated Patient    Methods Explanation;Demonstration;Verbal cues    Comprehension Verbalized understanding;Returned demonstration;Verbal cues required            SLP Short Term Goals - 02/27/20 1636      SLP SHORT TERM GOAL #1   Title pt will complete HEP with rare min A    Period --   sessions, for all STGs   Status Partially Met      SLP SHORT TERM GOAL #2   Title pt will tell SLP why pt is completing HEP with modified independence    Status Not Met      SLP SHORT TERM GOAL #3   Title pt will  describe 3 overt s/s aspiration PNA with modified independence    Status Not Met   added to [place] #4   Title pt will tell SLP how a food journal could hasten return to a more normalized diet    Status Achieved            SLP Long Term Goals - 02/27/20 1636      SLP LONG TERM GOAL #1   Title pt will complete HEP with min A over two visits    Time 2    Period --   sessions, for all LTGs   Status Revised      SLP LONG TERM GOAL #2   Title pt will describe how to modify HEP over time, and the timeline associated with reduction in HEP frequency with modified independence over two sessions    Time 3    Status On-going      SLP LONG TERM GOAL #3   Title pt will tell SLP 3 overt s/sx aspiration PNA with modified independence    Status Achieved            Plan - 02/27/20 1632    Clinical Impression Statement Tina Kennedy swallowing remains WNL with thin and dys III item (cheese). No overt s/sx aspiration PNA today, nor any reported to SLP by pt. Pt's voice quality appears WNL. Pt reports cont to attempt to eat more POs and ageusia continues to bother her although not as much as last month. SLP reviewed  individualized HEP for dysphagia and pt completed each exercise on their own, again with occasional min-mod A with faded cues  to modified indpendent. Pt has not copmleted HEP frequency as prescribed for three visits continuously, and she appears safe with dys III/some regular solids and thin liquids - she will be reduced to x1/every other month. Data indicate that pt's swallow ability will likely decrease over the course of radiation therapy and could very well decline over time following conclusion of their radiation therapy due to muscle disuse atrophy and/or muscle fibrosis. Pt will cont to need to be seen by SLP in order to assess safety of PO intake, assess the need for recommending any objective swallow assessment, and ensuring pt correctly completes the individualized  HEP.    Speech Therapy Frequency --   once approx every 8 weeks   Duration --   6 total visits   Treatment/Interventions Aspiration precaution training;Pharyngeal strengthening exercises;Diet toleration management by SLP;Trials of upgraded texture/liquids;Patient/family education;SLP instruction and feedback;Compensatory strategies  Potential to Achieve Goals Fair    Potential Considerations Severity of impairments    SLP Home Exercise Plan provided today    Consulted and Agree with Plan of Care Patient           Patient will benefit from skilled therapeutic intervention in order to improve the following deficits and impairments:   Dysphagia, unspecified type - Plan: SLP plan of care cert/re-cert    Problem List Patient Active Problem List   Diagnosis Date Noted   Port-A-Cath in place 12/26/2019   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 ,[place], [place]  02/27/2020, 4:39 PM  [facility] [address] [place], [place], [address] Phone: [phone]   Fax:  [phone]   Name: Tina Kennedy MRN: [id number] Date of Birth: [birth year]

## 2020-03-03 ENCOUNTER — Inpatient Hospital Stay: Payer: Medicare Other | Attending: Oncology

## 2020-03-03 ENCOUNTER — Inpatient Hospital Stay: Payer: Medicare Other

## 2020-03-03 ENCOUNTER — Other Ambulatory Visit: Payer: Self-pay

## 2020-03-03 DIAGNOSIS — C770 Secondary and unspecified malignant neoplasm of lymph nodes of head, face and neck: Secondary | ICD-10-CM | POA: Insufficient documentation

## 2020-03-03 DIAGNOSIS — C139 Malignant neoplasm of hypopharynx, unspecified: Secondary | ICD-10-CM | POA: Insufficient documentation

## 2020-03-03 DIAGNOSIS — Z23 Encounter for immunization: Secondary | ICD-10-CM

## 2020-03-03 DIAGNOSIS — Z95828 Presence of other vascular implants and grafts: Secondary | ICD-10-CM

## 2020-03-03 LAB — CBC WITH DIFFERENTIAL (CANCER CENTER ONLY)
Abs Immature Granulocytes: 0.01 10*3/uL (ref 0.00–0.07)
Basophils Absolute: 0 10*3/uL (ref 0.0–0.1)
Basophils Relative: 1 %
Eosinophils Absolute: 0.1 10*3/uL (ref 0.0–0.5)
Eosinophils Relative: 2 %
HCT: 23.9 % — ABNORMAL LOW (ref 36.0–46.0)
Hemoglobin: 8.6 g/dL — ABNORMAL LOW (ref 12.0–15.0)
Immature Granulocytes: 0 %
Lymphocytes Relative: 16 %
Lymphs Abs: 0.5 10*3/uL — ABNORMAL LOW (ref 0.7–4.0)
MCH: 33.6 pg (ref 26.0–34.0)
MCHC: 36 g/dL (ref 30.0–36.0)
MCV: 93.4 fL (ref 80.0–100.0)
Monocytes Absolute: 0.4 10*3/uL (ref 0.1–1.0)
Monocytes Relative: 12 %
Neutro Abs: 2.2 10*3/uL (ref 1.7–7.7)
Neutrophils Relative %: 69 %
Platelet Count: 270 10*3/uL (ref 150–400)
RBC: 2.56 MIL/uL — ABNORMAL LOW (ref 3.87–5.11)
RDW: 13.2 % (ref 11.5–15.5)
WBC Count: 3.1 10*3/uL — ABNORMAL LOW (ref 4.0–10.5)
nRBC: 0 % (ref 0.0–0.2)

## 2020-03-03 LAB — CMP (CANCER CENTER ONLY)
ALT: <6 U/L (ref 0–44)
AST: 18 U/L (ref 15–41)
Albumin: 2.7 g/dL — ABNORMAL LOW (ref 3.5–5.0)
Alkaline Phosphatase: 79 U/L (ref 38–126)
Anion gap: 7 (ref 5–15)
BUN: 11 mg/dL (ref 8–23)
CO2: 22 mmol/L (ref 22–32)
Calcium: 8.3 mg/dL — ABNORMAL LOW (ref 8.9–10.3)
Chloride: 104 mmol/L (ref 98–111)
Creatinine: 1.16 mg/dL — ABNORMAL HIGH (ref 0.44–1.00)
GFR, Estimated: 51 mL/min — ABNORMAL LOW (ref >60)
Glucose, Bld: 91 mg/dL (ref 70–99)
Potassium: 4.7 mmol/L (ref 3.5–5.1)
Sodium: 133 mmol/L — ABNORMAL LOW (ref 135–145)
Total Bilirubin: 0.5 mg/dL (ref 0.3–1.2)
Total Protein: 6.1 g/dL — ABNORMAL LOW (ref 6.5–8.1)

## 2020-03-03 MED ORDER — HEPARIN SOD (PORK) LOCK FLUSH 100 UNIT/ML IV SOLN
500.0000 [IU] | Freq: Once | INTRAVENOUS | Status: AC
  Administered 2020-03-03: 500 [IU]
  Filled 2020-03-03: qty 5

## 2020-03-03 MED ORDER — SODIUM CHLORIDE 0.9% FLUSH
10.0000 mL | Freq: Once | INTRAVENOUS | Status: AC
  Administered 2020-03-03: 10 mL
  Filled 2020-03-03: qty 10

## 2020-03-04 ENCOUNTER — Telehealth: Payer: Self-pay | Admitting: Nutrition

## 2020-03-04 NOTE — Telephone Encounter (Signed)
Nutrition follow-up completed over the telephone. Patient is status post radiation therapy for hypopharyngeal cancer. Patient is excited to be visiting her sister for Thanksgiving. She is a bit frustrated that her taste is coming and going.  She is happy she can taste coffee because she loves it. Reports she eats every day and had a boiled egg, bacon, and toast for breakfast. Patient reports her weight is stable between 103 and 105 pounds. Continues to use Osmolite 1.5, 3-4 cartons daily via PEG. Reports she has plenty of tube feeding supplies. Denies nausea, vomiting, constipation, diarrhea. No questions at this time.  Nutrition diagnosis: Inadequate oral intake improved.  Estimated nutrition needs: 1700-2000 cal, 75-100 g protein, 2.2 L fluid.  Intervention: Continue 3-4 cartons Osmolite 1.5 via PEG daily. Continue to expand variety of foods as well as volume to assist with weight maintenance. Contact RD with questions or concerns.  Monitoring, evaluation, goals: Patient will tolerate adequate calories and protein for weight maintenance/weight gain.  Next visit: Will follow-up in 4 to 6 weeks.  **Disclaimer: This note was dictated with voice recognition software. Similar sounding words can inadvertently be transcribed and this note may contain transcription errors which may not have been corrected upon publication of note.**

## 2020-03-26 ENCOUNTER — Ambulatory Visit: Payer: Medicare Other

## 2020-03-30 ENCOUNTER — Telehealth: Payer: Self-pay | Admitting: *Deleted

## 2020-03-30 NOTE — Telephone Encounter (Signed)
CALLED PATIENT TO INFORM OF PET SCAN FOR 04-08-20 - ARRIVAL TIME- 3 PM @ [facility], PT. TO BE NPO- 6 HRS. PRIOR TO TEST, PATIENT TO RECEIVE RESULTS FROM DR. SQUIRE ON 04-15-20 @ 3:30 PM, SPOKE WITH PATIENT AND SHE IS AWARE OF THESE APPTS.

## 2020-03-31 ENCOUNTER — Telehealth: Payer: Self-pay | Admitting: Dietician

## 2020-03-31 NOTE — Telephone Encounter (Signed)
Nutrition follow-up completed via telephone. Patient is s/p radiation therapy for hypopharyngeal cancer.  Patient reports she is doing great, states her appetite is improving, taste continues to be "off and on." Recalls eating a variety of foods (beef liver with gravy, eggs, bacon, coffee). Patient reports concerns about NPO x 6 hours prior to her upcoming PET scan on 04/08/20. She is worried that she may feel dizzy if she does not eat.  Patient reports weights are stable, weighed 103 lbs on home scale yesterday.  Continues to use Osmolite 1.5, 3-4 cartons daily via PEG and has plenty of tube feeding supplies. Reports regular bowel movements, denies nausea, vomiting.  Nutrition diagnosis: Inadequate oral intake improved.  Estimated nutrition needs: 1700-2000 kcal, 75-100 grams protein, 2.2 L fluid  Intervention: Continue 3-4 cartons Osmolite 1.5 via PEG daily Continue to eat a variety of foods to assist with weight maintenance. Encouraged patient to eat breakfast the morning of PET scan and bring a carton of Osmolite 1.5 or snack with her to appointment to have once scan is complete. Contact RD with questions or concerns.  Monitoring, evaluation, goals: Patient will tolerate adequate calories and protein for weight maintenance/weight gain.  Next visit: Will follow-up in 4-6 weeks.  Lajuan Lines, RD, LDN Clinical Nutrition After Hours/Weekend Pager # in [phone]

## 2020-04-08 ENCOUNTER — Other Ambulatory Visit: Payer: Self-pay

## 2020-04-08 ENCOUNTER — Encounter (HOSPITAL_COMMUNITY): Payer: Medicare Other

## 2020-04-08 ENCOUNTER — Ambulatory Visit (HOSPITAL_COMMUNITY)
Admission: RE | Admit: 2020-04-08 | Discharge: 2020-04-08 | Disposition: A | Discharge status: Home / Self Care | Payer: Medicare Other | Source: Ambulatory Visit | Attending: Radiation Oncology | Admitting: Radiation Oncology

## 2020-04-08 ENCOUNTER — Inpatient Hospital Stay (HOSPITAL_BASED_OUTPATIENT_CLINIC_OR_DEPARTMENT_OTHER): Payer: Medicare Other | Admitting: Oncology

## 2020-04-08 ENCOUNTER — Inpatient Hospital Stay: Payer: Medicare Other

## 2020-04-08 ENCOUNTER — Inpatient Hospital Stay: Payer: Medicare Other | Attending: Oncology

## 2020-04-08 VITALS — BP 133/78 | HR 80 | Temp 98.7°F | Resp 20 | Ht 65.0 in | Wt 98.6 lb

## 2020-04-08 DIAGNOSIS — Z923 Personal history of irradiation: Secondary | ICD-10-CM | POA: Insufficient documentation

## 2020-04-08 DIAGNOSIS — C139 Malignant neoplasm of hypopharynx, unspecified: Secondary | ICD-10-CM | POA: Insufficient documentation

## 2020-04-08 DIAGNOSIS — Z95828 Presence of other vascular implants and grafts: Secondary | ICD-10-CM

## 2020-04-08 DIAGNOSIS — I2510 Atherosclerotic heart disease of native coronary artery without angina pectoris: Secondary | ICD-10-CM | POA: Diagnosis not present

## 2020-04-08 DIAGNOSIS — Z452 Encounter for adjustment and management of vascular access device: Secondary | ICD-10-CM | POA: Insufficient documentation

## 2020-04-08 DIAGNOSIS — D701 Agranulocytosis secondary to cancer chemotherapy: Secondary | ICD-10-CM | POA: Insufficient documentation

## 2020-04-08 DIAGNOSIS — D6481 Anemia due to antineoplastic chemotherapy: Secondary | ICD-10-CM | POA: Diagnosis not present

## 2020-04-08 DIAGNOSIS — Z931 Gastrostomy status: Secondary | ICD-10-CM | POA: Diagnosis not present

## 2020-04-08 DIAGNOSIS — J984 Other disorders of lung: Secondary | ICD-10-CM | POA: Insufficient documentation

## 2020-04-08 DIAGNOSIS — C770 Secondary and unspecified malignant neoplasm of lymph nodes of head, face and neck: Secondary | ICD-10-CM | POA: Insufficient documentation

## 2020-04-08 LAB — CBC WITH DIFFERENTIAL (CANCER CENTER ONLY)
Abs Immature Granulocytes: 0.02 10*3/uL (ref 0.00–0.07)
Basophils Absolute: 0 10*3/uL (ref 0.0–0.1)
Basophils Relative: 0 %
Eosinophils Absolute: 0 10*3/uL (ref 0.0–0.5)
Eosinophils Relative: 1 %
HCT: 35.3 % — ABNORMAL LOW (ref 36.0–46.0)
Hemoglobin: 12.9 g/dL (ref 12.0–15.0)
Immature Granulocytes: 1 %
Lymphocytes Relative: 15 %
Lymphs Abs: 0.4 10*3/uL — ABNORMAL LOW (ref 0.7–4.0)
MCH: 32.5 pg (ref 26.0–34.0)
MCHC: 36.5 g/dL — ABNORMAL HIGH (ref 30.0–36.0)
MCV: 88.9 fL (ref 80.0–100.0)
Monocytes Absolute: 0.4 10*3/uL (ref 0.1–1.0)
Monocytes Relative: 14 %
Neutro Abs: 1.9 10*3/uL (ref 1.7–7.7)
Neutrophils Relative %: 69 %
Platelet Count: 247 10*3/uL (ref 150–400)
RBC: 3.97 MIL/uL (ref 3.87–5.11)
RDW: 11.9 % (ref 11.5–15.5)
WBC Count: 2.8 10*3/uL — ABNORMAL LOW (ref 4.0–10.5)
nRBC: 0 % (ref 0.0–0.2)

## 2020-04-08 LAB — CMP (CANCER CENTER ONLY)
ALT: 8 U/L (ref 0–44)
AST: 16 U/L (ref 15–41)
Albumin: 3 g/dL — ABNORMAL LOW (ref 3.5–5.0)
Alkaline Phosphatase: 81 U/L (ref 38–126)
Anion gap: 6 (ref 5–15)
BUN: 10 mg/dL (ref 8–23)
CO2: 25 mmol/L (ref 22–32)
Calcium: 8.6 mg/dL — ABNORMAL LOW (ref 8.9–10.3)
Chloride: 100 mmol/L (ref 98–111)
Creatinine: 1.22 mg/dL — ABNORMAL HIGH (ref 0.44–1.00)
GFR, Estimated: 48 mL/min — ABNORMAL LOW (ref >60)
Glucose, Bld: 149 mg/dL — ABNORMAL HIGH (ref 70–99)
Potassium: 4.1 mmol/L (ref 3.5–5.1)
Sodium: 131 mmol/L — ABNORMAL LOW (ref 135–145)
Total Bilirubin: 0.3 mg/dL (ref 0.3–1.2)
Total Protein: 6.4 g/dL — ABNORMAL LOW (ref 6.5–8.1)

## 2020-04-08 LAB — GLUCOSE, CAPILLARY: Glucose-Capillary: 113 mg/dL — ABNORMAL HIGH (ref 70–99)

## 2020-04-08 MED ORDER — SODIUM CHLORIDE 0.9% FLUSH
10.0000 mL | Freq: Once | INTRAVENOUS | Status: AC
  Administered 2020-04-08: 10:00:00 10 mL
  Filled 2020-04-08: qty 10

## 2020-04-08 MED ORDER — ALTEPLASE 2 MG IJ SOLR
2.0000 mg | Freq: Once | INTRAMUSCULAR | Status: AC | PRN
  Administered 2020-04-08: 10:00:00 2 mg
  Filled 2020-04-08: qty 2

## 2020-04-08 MED ORDER — FLUDEOXYGLUCOSE F - 18 (FDG) INJECTION
5.0100 | Freq: Once | INTRAVENOUS | Status: AC | PRN
  Administered 2020-04-08: 12:00:00 5.01 via INTRAVENOUS

## 2020-04-08 MED ORDER — ALTEPLASE 2 MG IJ SOLR
2.0000 mg | Freq: Once | INTRAMUSCULAR | Status: DC
  Filled 2020-04-08: qty 2

## 2020-04-08 MED ORDER — ALTEPLASE 2 MG IJ SOLR
INTRAMUSCULAR | Status: AC
  Filled 2020-04-08: qty 2

## 2020-04-08 MED ORDER — HEPARIN SOD (PORK) LOCK FLUSH 100 UNIT/ML IV SOLN
500.0000 [IU] | Freq: Once | INTRAVENOUS | Status: AC
  Administered 2020-04-08: 10:00:00 500 [IU]
  Filled 2020-04-08: qty 5

## 2020-04-08 NOTE — Progress Notes (Signed)
Hematology and Oncology Follow Up Visit  [place] Khilynn Borntreger [id number] Nov 16, 1950 69 y.o. 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     Prior Therapy:  She is status post direct laryngoscopy and biopsy completed on October 15, 2019.  Chemotherapy with radiation started in November 22, 2019.  She received cisplatin chemotherapy concomitantly with radiation for a total of 6 cycles completed on December 26, 2019.   Current therapy: Active surveillance.   Interim History: Ms. Kolasa presents today for repeat evaluation.  Since the last visit, she continues to report reasonable improvement in her health and performance status.  She is eating better although still utilizing the feeding tube regularly.  She denies any nausea, vomiting or abdominal pain.  She denies any recent hospitalization or illnesses.  Formal status quality of life continues to improve.  Her mouth pain has resolved.    Medications: Updated on review. Current Outpatient Medications  Medication Sig Dispense Refill  . HYDROcodone-acetaminophen (HYCET) 7.5-325 mg/15 ml solution Take 15 mLs by mouth 4 (four) times daily as needed for moderate pain. 473 mL 0  . lidocaine (XYLOCAINE) 2 % solution Patient: Mix 1part 2% viscous lidocaine, 1part H20. Swallow 33mL of diluted mixture, before meals and at bedtime, up to QID 200 mL 3  . lidocaine-prilocaine (EMLA) cream Apply 1 application topically as needed. 30 g 0  . Nutritional Supplements (FEEDING SUPPLEMENT, OSMOLITE 1.5 CAL,) LIQD Give 1 carton Osmolite 1.5 via G-tube with 60 mL free water before and after each feeding 5 times daily, 3 hours apart.  This provides greater than 90% estimated needs.  1775 cal, 74.5 g protein, 1505 mL free water.  Please send supplies and formula. 1185 mL 6  . prochlorperazine (COMPAZINE) 10 MG  tablet Take 1 tablet (10 mg total) by mouth every 6 (six) hours as needed for nausea or vomiting. (Patient not taking: Reported on 12/06/2019) 30 tablet 0   No current facility-administered medications for this visit.   Facility-Administered Medications Ordered in Other Visits  Medication Dose Route Frequency Provider Last Rate Last Admin  . alteplase (CATHFLO ACTIVASE) injection 2 mg  2 mg Intracatheter Once PRN Benjiman Core, MD      . alteplase (CATHFLO ACTIVASE) injection 2 mg  2 mg Intracatheter Once Hoa Briggs N, MD      . heparin lock flush 100 unit/mL  500 Units Intracatheter Once Benjiman Core, MD      . sodium chloride flush (NS) 0.9 % injection 10 mL  10 mL Intracatheter Once Benjiman Core, MD         Allergies: No Known Allergies    Physical Exam:   Blood pressure 133/78, pulse 80, temperature 98.7 F (37.1 C), temperature source Tympanic, resp. rate 20, height 5\' 5"  (1.651 m), weight 98 lb 9.6 oz (44.7 kg), SpO2 100 %.    ECOG: 1    General appearance: Alert, awake without any distress. Head: Atraumatic without abnormalities Oropharynx: Without any thrush or ulcers. Eyes: No scleral icterus. Lymph nodes: No lymphadenopathy noted in the cervical, supraclavicular, or axillary nodes Heart:regular rate and rhythm, without any murmurs or gallops.   Lung: Clear to auscultation without any rhonchi, wheezes or dullness to percussion. Abdomin: Soft, nontender without any shifting dullness or ascites. Musculoskeletal: No clubbing or cyanosis. Neurological: No motor or sensory deficits. Skin: No rashes or lesions.  Lab Results: Lab Results  Component Value Date   WBC 3.1 (L) 03/03/2020   HGB 8.6 (L) 03/03/2020   HCT 23.9 (L) 03/03/2020   MCV 93.4 03/03/2020   PLT 270 03/03/2020     Chemistry      Component Value Date/Time   NA 133 (L) 03/03/2020 1140   K 4.7 03/03/2020 1140   CL 104 03/03/2020 1140   CO2 22 03/03/2020 1140   BUN 11  03/03/2020 1140   CREATININE 1.16 (H) 03/03/2020 1140      Component Value Date/Time   CALCIUM 8.3 (L) 03/03/2020 1140   ALKPHOS 79 03/03/2020 1140   AST 18 03/03/2020 1140   ALT <6 03/03/2020 1140   BILITOT 0.5 03/03/2020 1140         Impression and Plan:   69 year old woman with:  1.  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    She has a completed definitive therapy as outlined above and currently on active surveillance.  She is scheduled to have PET scan later today to confirm treatment response at this time.  The natural course of this disease and future treatment options were discussed.  No additional treatment will be needed if she has complete response to therapy.  We will repeat imaging studies in 6 months after that.  She is agreeable with this plan.  2.  IV access: Port-A-Cath remains in place.  This will be tentatively removed if pet imaging showed no evidence of disease relapse.  3.  Nutrition: Feeding tube remains in place will consider removal in the near future.  For the time being I opted to keep it.  Given her questionable nutritional intake.   4.  Antiemetics: No residual nausea or vomiting reported.  5.  Renal function surveillance: Kidney function remained stable without any further decline after platinum therapy.  6.  Prognosis and goals of care: Therapy remains curative at this time and aggressive measures are warranted.  7.  Anemia and leukocytopenia: Related to chemotherapy and anticipate continuous improvement.  Her labs continue to improve and recover from the effect of chemotherapy.  Her hemoglobin is normalized white cell count still improving.   8.  Follow-up: Will be in the next 3 months for repeat evaluation.  30  minutes were dedicated to this visit.  Time spent on reviewing disease status, discussing treatment options and future plan of care review.   Zola Button, MD 12/29/20219:46 AM

## 2020-04-09 ENCOUNTER — Telehealth: Payer: Self-pay | Admitting: Oncology

## 2020-04-09 NOTE — Telephone Encounter (Signed)
Scheduled follow-up appointment per 12/29 los. Patient is aware but requested a schedule be mailed to her. Mailed patient a copy of her schedule.

## 2020-04-15 ENCOUNTER — Other Ambulatory Visit: Payer: Self-pay

## 2020-04-15 ENCOUNTER — Ambulatory Visit
Admission: RE | Admit: 2020-04-15 | Discharge: 2020-04-15 | Disposition: A | Discharge status: Home / Self Care | Payer: Medicare Other | Source: Ambulatory Visit | Attending: Radiation Oncology | Admitting: Radiation Oncology

## 2020-04-15 VITALS — BP 156/87 | HR 78 | Temp 98.0°F | Resp 20 | Wt 103.2 lb

## 2020-04-15 DIAGNOSIS — C132 Malignant neoplasm of posterior wall of hypopharynx: Secondary | ICD-10-CM | POA: Diagnosis not present

## 2020-04-15 DIAGNOSIS — I6523 Occlusion and stenosis of bilateral carotid arteries: Secondary | ICD-10-CM | POA: Diagnosis not present

## 2020-04-15 DIAGNOSIS — Z85819 Personal history of malignant neoplasm of unspecified site of lip, oral cavity, and pharynx: Secondary | ICD-10-CM | POA: Insufficient documentation

## 2020-04-15 DIAGNOSIS — F17210 Nicotine dependence, cigarettes, uncomplicated: Secondary | ICD-10-CM | POA: Diagnosis not present

## 2020-04-15 DIAGNOSIS — Z923 Personal history of irradiation: Secondary | ICD-10-CM | POA: Insufficient documentation

## 2020-04-15 DIAGNOSIS — Z08 Encounter for follow-up examination after completed treatment for malignant neoplasm: Secondary | ICD-10-CM | POA: Diagnosis not present

## 2020-04-15 DIAGNOSIS — I700 Atherosclerosis of aorta: Secondary | ICD-10-CM | POA: Diagnosis not present

## 2020-04-15 NOTE — Progress Notes (Signed)
Tina Kennedy presents today for follow up of radiation to her hypopharynx completed on 01/10/2020 and to review PET scan from 04/08/2020  Pain issues, if any: Patient denies Using a feeding tube?: Yes--she does about 3 feedings a day. Had telephone F/U with Barb Neff-RD on 03/04/2020: "Reports she eats every day and had a boiled egg, bacon, and toast for breakfast. Patient reports her weight is stable between 103 and 105 pounds. Continues to use Osmolite 1.5, 3-4 cartons daily via PEG. Reports she has plenty of tube feeding supplies. Denies nausea, vomiting, constipation, diarrhea. No questions at this time." Weight changes, if any:  Wt Readings from Last 3 Encounters:  04/15/20 103 lb 3.2 oz (46.8 kg)  04/08/20 98 lb 9.6 oz (44.7 kg)  02/04/20 103 lb 14.4 oz (47.1 kg)   Swallowing issues, if any: Patient denies. Reports she has started eating a wide variety of foods and denies any issues with thin liquids. Saw Baldo Ash Schinke-SLP on 02/27/2020: "Tina Kennedy's swallowing remains WNL with thin and dys III item (cheese). No overt s/sx aspiration PNA today, nor any reported to SLP by pt. Pt's voice quality appears WNL. Pt reports cont to attempt to eat more POs and ageusia continues to bother her although not as much as last month" Smoking or chewing tobacco? Occasionally; still smokes 1-2 cigarettes a day Using fluoride trays daily? N/A Last ENT visit was on: Not since diagnosis  Other notable issues, if any: Patient reports she feels great overall. Denies any symptoms of lymphedema. Denies any pain to her ear, jaw, or trouble opening her mouth fully. Still has altereted taste, but reports it's improving gradually. States her appetite has improved and she's eating more by mouth. States she's sleeping well and overall very pleased with her progress thus far  Vitals:   04/15/20 1530  BP: (!) 156/87  Pulse: 78  Resp: 20  Temp: 98 F (36.7 C)  SpO2: 100%

## 2020-04-16 NOTE — Progress Notes (Signed)
Oncology Nurse Navigator Documentation  Per patient's 04/15/20 post-treatment follow-up with Dr. Basilio Cairo, sent fax to [facility] ENT Scheduling with request Ms. Mayer  be contacted and scheduled for routine post-RT follow-up with Dr. Pollyann Kennedy in 3 months.  Notification of successful fax transmission received.  Hedda Slade RN, BSN, OCN Head & Neck Oncology Nurse Navigator [facility] at [facility] Phone # [phone]  Fax # [phone]

## 2020-04-20 ENCOUNTER — Encounter: Payer: Self-pay | Admitting: Radiation Oncology

## 2020-04-20 ENCOUNTER — Other Ambulatory Visit: Payer: Self-pay | Admitting: Oncology

## 2020-04-20 DIAGNOSIS — C139 Malignant neoplasm of hypopharynx, unspecified: Secondary | ICD-10-CM

## 2020-04-20 NOTE — Progress Notes (Signed)
Radiation Oncology         [phone]) [phone] ________________________________  Name: Tina Kennedy MRN: [id number]  Date: 04/15/2020  DOB: 08-14-1950  Follow-Up Visit Note  CC: Patient, No Pcp Per  Izora Gala, MD  Diagnosis and Prior Radiotherapy:       ICD-10-CM   1. 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     Tina Kennedy presents today for follow up of radiation to her hypopharynx completed on 01/10/2020 and to review PET scan from 04/08/2020.  I personally reviewed her images with tumor board this morning.  Pain issues, if any: Patient denies Using a feeding tube?: Yes--she does about 3 feedings a day. Had telephone F/U with Barb Neff-RD on 03/04/2020: "Reports she eats every day and had a boiled egg, bacon, and toast for breakfast. Patient reports her weight is stable between 103 and 105 pounds. Continues to use Osmolite 1.5, 3-4 cartons daily via PEG. Reports she has plenty of tube feeding supplies. Denies nausea, vomiting, constipation, diarrhea. No questions at this time." Weight changes, if any:  Wt Readings from Last 3 Encounters:  04/15/20 103 lb 3.2 oz (46.8 kg)  04/08/20 98 lb 9.6 oz (44.7 kg)  02/04/20 103 lb 14.4 oz (47.1 kg)   Swallowing issues, if any: Patient denies. Reports she has started eating a wide variety of foods and denies any issues with thin liquids. Saw Glendell Docker Schinke-SLP on 02/27/2020: "Shalan's swallowing remains WNL with thin and dys III item (cheese). No overt s/sx aspiration PNA today, nor any reported to SLP by pt. Pt's voice quality appears WNL. Pt reports cont to attempt to eat more POs and ageusia continues to  bother her although not as much as last month" Smoking or chewing tobacco? Occasionally; still smokes 1-2 cigarettes a day Using fluoride trays daily? N/A Last ENT visit was on: Not since diagnosis  Other notable issues, if any: Patient reports she feels great overall. Denies any symptoms of lymphedema. Denies any pain to her ear, jaw, or trouble opening her mouth fully. Still has altereted taste, but reports it's improving gradually. States her appetite has improved and she's eating more by mouth. States she's sleeping well and overall very pleased with her progress thus far  Vitals:   04/15/20 1530  BP: (!) 156/87  Pulse: 78  Resp: 20  Temp: 98 F (36.7 C)  SpO2: 100%   ALLERGIES:  has No Known Allergies.  Meds: Current Outpatient Medications  Medication Sig Dispense Refill  . Nutritional Supplements (FEEDING SUPPLEMENT, OSMOLITE 1.5 CAL,) LIQD Give 1 carton Osmolite 1.5 via G-tube with 60 mL free water before and after each feeding 5 times daily, 3 hours apart.  This provides greater than 90% estimated needs.  1775 cal, 74.5 g protein, 1505 mL free water.  Please send supplies and formula. 1185 mL 6  . HYDROcodone-acetaminophen (HYCET) 7.5-325 mg/15 ml solution Take 15 mLs by mouth 4 (four) times daily as needed for moderate pain. (Patient not taking: Reported on 04/15/2020) 473 mL 0  . lidocaine (XYLOCAINE) 2 % solution Patient: Mix 1part 2% viscous lidocaine, 1part H20. Swallow 51mL of diluted mixture, 67min before meals and at  bedtime, up to QID (Patient not taking: Reported on 04/15/2020) 200 mL 3  . lidocaine-prilocaine (EMLA) cream Apply 1 application topically as needed. (Patient not taking: Reported on 04/15/2020) 30 g 0  . prochlorperazine (COMPAZINE) 10 MG tablet Take 1 tablet (10 mg total) by mouth every 6 (six) hours as needed for nausea or vomiting. (Patient not taking: No sig reported) 30 tablet 0   No current facility-administered medications for this encounter.    Physical  Findings: The patient is in no acute distress. Patient is alert and oriented. Wt Readings from Last 3 Encounters:  04/15/20 103 lb 3.2 oz (46.8 kg)  04/08/20 98 lb 9.6 oz (44.7 kg)  02/04/20 103 lb 14.4 oz (47.1 kg)    weight is 103 lb 3.2 oz (46.8 kg). Her temperature is 98 F (36.7 C). Her blood pressure is 156/87 (abnormal) and her pulse is 78. Her respiration is 20 and oxygen saturation is 100%. .  General: Alert and oriented, in no acute distress HEENT: Head is normocephalic.  Oropharynx is notable for no oral lesions or upper throat lesions, no thrush Neck: Neck is notable for no palpable adenopathy Skin: Skin in treatment fields shows satisfactory healing  Extremities: No cyanosis or edema.    Lymphatics: see Neck Exam Psychiatric: Judgment and insight are intact. Affect is appropriate. 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 EXAM: NUCLEAR MEDICINE PET SKULL BASE TO THIGH TECHNIQUE: 5.01 mCi F-18 FDG was injected intravenously. Full-ring PET imaging was performed from the skull base to thigh after the radiotracer. CT data was obtained and used for attenuation correction and anatomic localization. Fasting blood glucose: 113 mg/dl COMPARISON:  PET-CT 11/07/2019.  CT neck 09/13/2019 FINDINGS: Mediastinal blood pool activity: SUV max 2.1 NECK: Previously demonstrated hypermetabolic activity within the hypopharynx and proximal esophagus has markedly improved, now with an SUV max of 3.8  (previously 33.0). No discrete residual mass lesion identified. Previously demonstrated hypermetabolic right retroperitoneal lymph node has resolved. There are no recurrent hypermetabolic cervical lymph nodes.There is focal hypermetabolic activity anteriorly within the oral cavity, localizing to the tip of the tongue (SUV max 9.4). No obvious corresponding mass lesion on the CT images, and this may be physiologic, although requires correlation with direct visualization. Incidental CT findings: Bilateral carotid atherosclerosis. CHEST: There are no hypermetabolic mediastinal, hilar or axillary lymph nodes. No hypermetabolic pulmonary activity or suspicious pulmonary nodularity. Low level hypermetabolic activity throughout the esophagus suggests esophagitis. Incidental CT findings: Moderate centrilobular emphysema. There is a stable ill-defined ground-glass density in the right lung near the minor fissure on image 37/8. There is atherosclerosis of the aorta, great vessels and coronary arteries. Right IJ Port-A-Cath extends to the lower SVC. ABDOMEN/PELVIS: There is no hypermetabolic activity within the liver, adrenal glands, spleen or pancreas. There is no hypermetabolic nodal activity. Hypermetabolic activity is again noted at the anus, SUV max 8.4 (previously 11.5). Again, this may relate to pelvic floor laxity and rectal prolapse, and there is  a large amount of stool within the rectum. Incidental CT findings: Percutaneous G-tube noted. There is diffuse aortic and branch vessel atherosclerosis. SKELETON: There is no hypermetabolic activity to suggest osseous metastatic disease. Incidental CT findings: none IMPRESSION: 1. Interval marked improvement in previously demonstrated hypermetabolic activity associated with the hypopharynx and proximal esophagus. Remaining activity is likely treatment related. No residual or recurrent hypermetabolic nodal activity. 2. New nonspecific focal activity anteriorly in the oral  cavity localizing to the tip of the tongue. Recommend correlation with direct visual inspection. No other abnormality of the pharyngeal mucosal space. 3. Stable to slightly improved anal hypermetabolism compared with previous study, likely related to rectal prolapse. Correlate clinically. 4. Stable ground-glass density in the right lung without hypermetabolic activity. Recommend chest CT follow-up in 1 year. 5. Low-level esophageal activity, likely reflecting esophagitis. Electronically Signed   By: Richardean Sale M.D.   On: 04/08/2020 14:14    Impression/Plan:    1) Head and Neck Cancer Status: She is in remission with excellent results per PET scan  2) Nutritional Status: Stable weight.  She is using her feeding tube and also taking food by mouth  3) Risk Factors: The patient has been educated about risk factors including alcohol and tobacco abuse; they understand that avoidance of alcohol and tobacco is important to prevent recurrences as well as other cancers; she is still smoking [place] but understands the importance of cessation to have an optimal prognosis.   4) Swallowing: Functional, continue speech-language pathology exercises  5)  Thyroid function: Check annually Lab Results  Component Value Date   TSH 0.658 11/20/2019    6) Follow-up in approximately 3 months with ENT.  Follow-up in September with me.  She sees medical oncology at the end of March  - that will be about 3 months from her most recent PET scan.  I will contact Dr. Alen Blew to see if he would like to order any surveillance imaging at that time.   She had a bulky tumor in the hypopharynx and esophagus and is at risk for metastatic disease in the future which may warrant close radiologic monitoring.  7) She had nonspecific findings in the anorectal region on her PET scan.  Anal exam was conducted but full digital rectal exam was deferred by patient. Evidence of external hemorrhoids on exam.  She denies any  blood in her stool.    On date of service, in total, I spent 30 minutes on this encounter. Patient was seen in person. _____________________________________   Eppie Gibson, MD

## 2020-04-28 ENCOUNTER — Telehealth: Payer: Self-pay

## 2020-04-28 NOTE — Telephone Encounter (Signed)
Nutrition Follow-up:  Patient with throat cancer.  Has completed radiation and chemotherapy (12/26/19).  Followed by Dr Isidore Moos and Alen Blew.   Spoke with patient via phone for nutrition follow-up.  Patient reports that she is mostly taking 3 cartons of osmolite 1.5 via feeding tube.  Reports that she is eating more by mouth. Taste is coming back but still "off and on."  Reports that she has trouble swallowing tough meats.  Breakfast is usually coffee, boiled egg and toast. Lunch is usually fruit.  Supper tonight will be baked beans and fish.  Likes neck bones, pig feet and vegetables.  Does not like the taste of ensure/boost shakes anymore.      Medications: reviewed  Labs: reviewed  Anthropometrics:   Weight 103 lb on 04/15/20 increased from 98 lb on 12/29.    103 lb on 02/04/20 107 lb on 9/16   Estimated Energy Needs  Kcals: 1700-2000  Protein: 75-100g Fluid: 2.2 L  NUTRITION DIAGNOSIS: Inadequate oral intake improved   INTERVENTION:  Encouraged patient to give tube feeding between meals to allow her to eat more orally.   Encourage soft, moist foods high in calories and protein to promote weight gain.   Goal would be to wean off tube feeding.      MONITORING, EVALUATION, GOAL: weight trends, intake, tube feeding   NEXT VISIT: in about 4-6 weeks  Shameca Landen B. Zenia Resides, [place], [place] Registered Dietitian [phone] (mobile)

## 2020-04-30 ENCOUNTER — Ambulatory Visit: Payer: Medicare Other | Attending: Radiation Oncology

## 2020-04-30 ENCOUNTER — Other Ambulatory Visit: Payer: Self-pay

## 2020-04-30 DIAGNOSIS — R1310 Dysphagia, unspecified: Secondary | ICD-10-CM | POA: Diagnosis not present

## 2020-04-30 NOTE — Therapy (Signed)
[facility] [address] [place], [place], [address] Phone: [phone]   Fax:  [phone]  Speech Language Pathology Treatment/Discharge Summary  Patient Details  Name: Tina Kennedy MRN: [id number] Date of Birth: 10/23/1950 Referring Provider (SLP): Eppie Gibson MD   Encounter Date: 04/30/2020   End of Session - 04/30/20 1528    Visit Number 5    Number of Visits 7    Date for SLP Re-Evaluation 05/08/20    SLP Start Time 1406    SLP Stop Time  1440    SLP Time Calculation (min) 34 min    Activity Tolerance Patient tolerated treatment well           Past Medical History:  Diagnosis Date  . Hypopharyngeal cancer [facility])     Past Surgical History:  Procedure Laterality Date  . DIRECT LARYNGOSCOPY N/A 10/15/2019   Procedure: DIRECT LARYNGOSCOPY w/BIOPSY;  Surgeon: Izora Gala, MD;  Location: [facility];  Service: ENT;  Laterality: N/A;  . GASTROSTOMY N/A 12/04/2019   Procedure: LAPAROSCOPIC G TUBE PLACEMENT;  Surgeon: Ralene Ok, MD;  Location: [facility];  Service: General;  Laterality: N/A;  . IR GASTROSTOMY TUBE MOD SED  11/15/2019  . IR IMAGING GUIDED PORT INSERTION  11/15/2019  . RIGID ESOPHAGOSCOPY N/A 10/15/2019   Procedure: RIGID ESOPHAGOSCOPY;  Surgeon: Izora Gala, MD;  Location: [facility];  Service: ENT;  Laterality: N/A;    There were no vitals filed for this visit.   SPEECH THERAPY DISCHARGE SUMMARY  Visits from Start of Care: 5  Current functional level related to goals / functional outcomes: See goals, below.   Remaining deficits: Swallowing remains WFL/WNL.   Education / Equipment: HEP procedure, need to complete HEP at prescribed frequency to give pt her best chance to maintain WNL swallowing over time, overt s/sx aspiration PNA, overt s/sx oral and pharyngeal dysphagia.   Plan: Patient agrees to discharge.  Patient goals were partially met. Patient is being discharged due to  being pleased with the current functional level.  ?????         Subjective Assessment - 04/30/20 1418    Subjective Pt eating everything she wants to eat now.    Currently in Pain? No/denies                 ADULT SLP TREATMENT - 04/30/20 1420      General Information   Behavior/Cognition Alert;Cooperative;Pleasant mood      Treatment Provided   Treatment provided Dysphagia      Dysphagia Treatment   Temperature Spikes Noted No    Respiratory Status Room air    Oral Cavity - Dentition Edentulous    Treatment Methods Skilled observation;Therapeutic exercise;Patient/caregiver education;Compensation strategy training    Patient observed directly with PO's Yes    Type of PO's observed Dysphagia 3 (soft);Thin liquids    Liquids provided via Cup    Oral Phase Signs & Symptoms Prolonged bolus formation   due to endentulous   Pharyngeal Phase Signs & Symptoms Complaints of residue   1/3 bites   Other treatment/comments SLP does not observe any overt s/s aspiration PNA. Pt also denies symptoms of aspiration PNA, and denies coughing/choking or throat clearing during POs. Tina Kennedy reports she needs to take water with drier foods, or if she takes smaller bites she does not require water. Pt tells SLP her frequency of HEP completion has been less than previous session (which was a few times a week). SLP encouraged  pt to review her HEP with him but pt politely refused stating she will likely not incr her frequency even if it is reviewed with her. SLP reviewed with pt overt s/sx aspiration PNA, and pt recalled receiving this handout in previous session. SLP told pt if she does not perform HEP her risk of swallowing difficulty, and of aspiration PNA increase. SLP told Tina Kennedy to contact Dr. Constance Holster if she should have difficulty with swallowing - SLP educated pt what these s/sx may look like. Pt demo'd understanding.      Assessment / Recommendations / Plan   Plan Discharge SLP treatment due to  (comment)   pt pleased with current functional level     Progression Toward Goals   Progression toward goals --   pt compliance with HEP is decreasing each session, pt is continuing WFL/WNL with POs; Tina Kennedy agrees with d/c today           SLP Education - 04/30/20 1526    Education Details possible ramifications of not performing HEP, overt s/s oral and pharyngeal dysphagia andwho to contact with difficulty swalloiwng (Dr. Constance Holster)    Person(s) Educated Patient    Methods Explanation    Comprehension Verbalized understanding            SLP Short Term Goals - 02/27/20 1636      SLP SHORT TERM GOAL #1   Title pt will complete HEP with rare min A    Period --   sessions, for all STGs   Status Partially Met      SLP SHORT TERM GOAL #2   Title pt will tell SLP why pt is completing HEP with modified independence    Status Not Met      SLP SHORT TERM GOAL #3   Title pt will describe 3 overt s/s aspiration PNA with modified independence    Status Not Met   added to [place] #4   Title pt will tell SLP how a food journal could hasten return to a more normalized diet    Status Achieved            SLP Long Term Goals - 04/30/20 1533      SLP LONG TERM GOAL #1   Title pt will complete HEP with min A over two visits    Period --   sessions, for all LTGs   Status Not Met      SLP LONG TERM GOAL #2   Title pt will describe how to modify HEP over time, and the timeline associated with reduction in HEP frequency with modified independence over two sessions    Status Not Met      SLP LONG TERM GOAL #3   Title pt will tell SLP 3 overt s/sx aspiration PNA with modified independence    Status Achieved            Plan - 04/30/20 1528    Clinical Impression Statement Tina Kennedy demonstrated WFL/WNL swallowing skills with thin and dys III item (ham sandwich) today. No overt s/sx aspiration PNA, nor any reported to SLP by pt during today's session. Pt's voice quality  remains WNL. Ageusia continues to bother her. SLP desired to review  individualized HEP for dysphagia but pt politely declined and stated she would not incr frequency anyway. Pt has again not completed HEP frequency as prescribed for four continuous visits, and continues to appear safe with dys III and thin liquids - but  reports eating some regular food items too. Data indicate that pt's swallow ability will likely decrease over the course of radiation therapy and could very well decline over time following conclusion of their radiation therapy due to muscle disuse atrophy and/or muscle fibrosis. At this time pt will be d/c'd due to she is pleased with her current functional level, remains safe with POs despite only rarely completing her HEP as prescribed.    Treatment/Interventions Aspiration precaution training;Pharyngeal strengthening exercises;Diet toleration management by SLP;Trials of upgraded texture/liquids;Patient/family education;SLP instruction and feedback;Compensatory strategies    Potential to Achieve Goals Fair    Potential Considerations Severity of impairments    SLP Home Exercise Plan provided today    Consulted and Agree with Plan of Care Patient           Patient will benefit from skilled therapeutic intervention in order to improve the following deficits and impairments:   Dysphagia, unspecified type    Problem List Patient Active Problem List   Diagnosis Date Noted  . Port-A-Cath in place 12/26/2019  . 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 Goals of care, counseling/discussion 10/31/2019  . Hypopharyngeal cancer ([place]) 10/28/2019    [facility] ,[place], [place]  04/30/2020, 3:34 PM  [facility] [address] [place], [place], [address] Phone: [phone]   Fax:  [phone]   Name: Tina Kennedy MRN: [id number] Date of Birth: [birth year]

## 2020-05-19 ENCOUNTER — Telehealth: Payer: Self-pay | Admitting: Oncology

## 2020-05-19 NOTE — Telephone Encounter (Signed)
Released records per pt request to pt

## 2020-05-20 ENCOUNTER — Telehealth: Payer: Self-pay | Admitting: Radiation Oncology

## 2020-05-20 NOTE — Telephone Encounter (Signed)
Spoke with patient to let her know that the radiation records she requested were ready for pick up at the front desk on the 1st level.

## 2020-06-10 ENCOUNTER — Telehealth: Payer: Self-pay

## 2020-06-10 NOTE — Telephone Encounter (Signed)
Nutrition Follow-up:  Patient with throat cancer followed by Dr Isidore Moos and Alen Blew.  Completed radiation and chemotherapy (12/26/19).  Spoke with patient via phone for nutrition follow-up.  Patient continues to use feeding tube 2 cartons of osmolite 1.5 daily (1 carton in AM and 1 in PM).  Patient has been eating foods orally as well.  Breakfast maybe grits, boiled egg and coffee. Lunch yesterday was hot dog frank on 1 slice bread with mustard.  Supper was pork chop with lettuce tomato sandwich.  Preparing spaghetti for supper tonight.  Says that she can eat anything that she wants.  Does not like the taste of ensure/boost shakes anymore.    SLP notes reviewed   Medications: reviewed  Labs: reviewed  Anthropometrics:   No new weight 103 lb on 1/5  NUTRITION DIAGNOSIS: Inadequate oral intake improved   INTERVENTION:  Discussed with patient the goal would be to remove the feeding tube.  Would recommend patient to not use feeding tube for at least 4 weeks and weight to be maintained before considering tube removal.  Patient wants to keep using tube as she has about 1 case of tube feeding formula left.   Discussed with patient that calories and protein will need to increase when she stops using tube to maintain weight. Recommend flushing tube with 45ml of water daily when not using for feeding until removal. Pt verbalized understanding.      MONITORING, EVALUATION, GOAL: weight trend, intake, tube feeding   NEXT VISIT: April 5 phone call with Jerrell Belfast B. Zenia Resides, [place], [place] Registered Dietitian [phone] (mobile)

## 2020-07-06 ENCOUNTER — Ambulatory Visit (HOSPITAL_COMMUNITY): Payer: Medicare Other

## 2020-07-07 ENCOUNTER — Inpatient Hospital Stay: Payer: Medicare Other

## 2020-07-07 ENCOUNTER — Inpatient Hospital Stay: Payer: Medicare Other | Attending: Oncology | Admitting: Oncology

## 2020-07-07 ENCOUNTER — Other Ambulatory Visit: Payer: Self-pay

## 2020-07-07 VITALS — HR 80 | Temp 98.8°F | Resp 18 | Wt 97.9 lb

## 2020-07-07 DIAGNOSIS — C139 Malignant neoplasm of hypopharynx, unspecified: Secondary | ICD-10-CM

## 2020-07-07 DIAGNOSIS — Z95828 Presence of other vascular implants and grafts: Secondary | ICD-10-CM

## 2020-07-07 DIAGNOSIS — D72819 Decreased white blood cell count, unspecified: Secondary | ICD-10-CM | POA: Insufficient documentation

## 2020-07-07 DIAGNOSIS — D649 Anemia, unspecified: Secondary | ICD-10-CM | POA: Diagnosis not present

## 2020-07-07 DIAGNOSIS — Z931 Gastrostomy status: Secondary | ICD-10-CM | POA: Diagnosis not present

## 2020-07-07 LAB — CMP (CANCER CENTER ONLY)
ALT: 8 U/L (ref 0–44)
AST: 19 U/L (ref 15–41)
Albumin: 3.1 g/dL — ABNORMAL LOW (ref 3.5–5.0)
Alkaline Phosphatase: 119 U/L (ref 38–126)
Anion gap: 9 (ref 5–15)
BUN: 15 mg/dL (ref 8–23)
CO2: 26 mmol/L (ref 22–32)
Calcium: 8.5 mg/dL — ABNORMAL LOW (ref 8.9–10.3)
Chloride: 95 mmol/L — ABNORMAL LOW (ref 98–111)
Creatinine: 1.01 mg/dL — ABNORMAL HIGH (ref 0.44–1.00)
GFR, Estimated: >60 mL/min (ref >60)
Glucose, Bld: 95 mg/dL (ref 70–99)
Potassium: 4.4 mmol/L (ref 3.5–5.1)
Sodium: 130 mmol/L — ABNORMAL LOW (ref 135–145)
Total Bilirubin: 0.7 mg/dL (ref 0.3–1.2)
Total Protein: 7 g/dL (ref 6.5–8.1)

## 2020-07-07 LAB — CBC WITH DIFFERENTIAL (CANCER CENTER ONLY)
Abs Immature Granulocytes: 0.02 10*3/uL (ref 0.00–0.07)
Basophils Absolute: 0 10*3/uL (ref 0.0–0.1)
Basophils Relative: 1 %
Eosinophils Absolute: 0 10*3/uL (ref 0.0–0.5)
Eosinophils Relative: 1 %
HCT: 43.3 % (ref 36.0–46.0)
Hemoglobin: 15.7 g/dL — ABNORMAL HIGH (ref 12.0–15.0)
Immature Granulocytes: 1 %
Lymphocytes Relative: 18 %
Lymphs Abs: 0.7 10*3/uL (ref 0.7–4.0)
MCH: 31.8 pg (ref 26.0–34.0)
MCHC: 36.3 g/dL — ABNORMAL HIGH (ref 30.0–36.0)
MCV: 87.7 fL (ref 80.0–100.0)
Monocytes Absolute: 0.7 10*3/uL (ref 0.1–1.0)
Monocytes Relative: 17 %
Neutro Abs: 2.5 10*3/uL (ref 1.7–7.7)
Neutrophils Relative %: 62 %
Platelet Count: 279 10*3/uL (ref 150–400)
RBC: 4.94 MIL/uL (ref 3.87–5.11)
RDW: 13.2 % (ref 11.5–15.5)
WBC Count: 4 10*3/uL (ref 4.0–10.5)
nRBC: 0 % (ref 0.0–0.2)

## 2020-07-07 MED ORDER — HEPARIN SOD (PORK) LOCK FLUSH 100 UNIT/ML IV SOLN
500.0000 [IU] | Freq: Once | INTRAVENOUS | Status: AC
  Administered 2020-07-07: 500 [IU]
  Filled 2020-07-07: qty 5

## 2020-07-07 MED ORDER — SODIUM CHLORIDE 0.9% FLUSH
10.0000 mL | Freq: Once | INTRAVENOUS | Status: AC
  Administered 2020-07-07: 10 mL
  Filled 2020-07-07: qty 10

## 2020-07-07 NOTE — Patient Instructions (Signed)

## 2020-07-07 NOTE — Progress Notes (Signed)
Hematology and Oncology Follow Up Visit  Tina Kennedy Tina Kennedy [id number] 04-10-1951 70 y.o. 07/07/2020 2:41 PM Patient, No Pcp Per (Inactive)No ref. 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  She presented with stage III disease and currently without relapse   Prior Therapy:  She is status post direct laryngoscopy and biopsy completed on October 15, 2019.  Chemotherapy with radiation started in November 22, 2019.  She received cisplatin chemotherapy concomitantly with radiation for a total of 6 cycles completed on December 26, 2019.   Current therapy: Observation and surveillance.   Interim History: Tina Kennedy is here for a follow-up visit.  Since the last visit, she reports no major changes in her health.  She continues to use feeding tube to supplement her nutrition and unable to maintain adequate nutrition via mouth only.  She denies any nausea, vomiting or mouth pain.  She denies any dysphagia or odontophagia.  He still have some difficulties associated with solid foods.    Medications: reviewed without change.  Current Outpatient Medications  Medication Sig Dispense Refill  . HYDROcodone-acetaminophen (HYCET) 7.5-325 mg/15 ml solution Take 15 mLs by mouth 4 (four) times daily as needed for moderate pain. (Patient not taking: Reported on 04/15/2020) 473 mL 0  . lidocaine (XYLOCAINE) 2 % solution Patient: Mix 1part 2% viscous lidocaine, 1part H20. Swallow 31mL of diluted mixture, 52min before meals and at bedtime, up to QID (Patient not taking: Reported on 04/15/2020) 200 mL 3  . lidocaine-prilocaine (EMLA) cream Apply 1 application topically as needed. (Patient not taking: Reported on 04/15/2020) 30 g 0  . Nutritional Supplements (FEEDING SUPPLEMENT, OSMOLITE 1.5 CAL,) LIQD Give 1 carton Osmolite 1.5 via G-tube with 60 mL free water before and after each feeding 5 times daily, 3 hours apart.  This  provides greater than 90% estimated needs.  1775 cal, 74.5 g protein, 1505 mL free water.  Please send supplies and formula. 1185 mL 6  . prochlorperazine (COMPAZINE) 10 MG tablet Take 1 tablet (10 mg total) by mouth every 6 (six) hours as needed for nausea or vomiting. (Patient not taking: No sig reported) 30 tablet 0   No current facility-administered medications for this visit.     Allergies: No Known Allergies    Physical Exam:   Pulse 80, temperature 98.8 F (37.1 C), temperature source Oral, resp. rate 18, weight 97 lb 14.4 oz (44.4 kg), SpO2 100 %.     ECOG: 1   General appearance: Comfortable appearing without any discomfort Head: Normocephalic without any trauma Oropharynx: Mucous membranes are moist and pink without any thrush or ulcers. Eyes: Pupils are equal and round reactive to light. Lymph nodes: No cervical, supraclavicular, inguinal or axillary lymphadenopathy.   Heart:regular rate and rhythm.  S1 and S2 without leg edema. Lung: Clear without any rhonchi or wheezes.  No dullness to percussion. Abdomin: Soft, nontender, nondistended with good bowel sounds.  No hepatosplenomegaly. Musculoskeletal: No joint deformity or effusion.  Full range of motion noted. Neurological: No deficits noted on motor, sensory and deep tendon reflex exam. Skin: No petechial rash or dryness.  Appeared moist.          Lab Results: Lab Results  Component Value Date   WBC 2.8 (L) 04/08/2020   HGB 12.9 04/08/2020   HCT 35.3 (L) 04/08/2020   MCV 88.9 04/08/2020   PLT 247 04/08/2020     Chemistry      Component Value Date/Time  NA 131 (L) 04/08/2020 0935   K 4.1 04/08/2020 0935   CL 100 04/08/2020 0935   CO2 25 04/08/2020 0935   BUN 10 04/08/2020 0935   CREATININE 1.22 (H) 04/08/2020 0935      Component Value Date/Time   CALCIUM 8.6 (L) 04/08/2020 0935   ALKPHOS 81 04/08/2020 0935   AST 16 04/08/2020 0935   ALT 8 04/08/2020 0935   BILITOT 0.3 04/08/2020 0935          Impression and Plan:   70 year old woman with:  1.  Head and neck cancer diagnosed in July 2021.  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     She has achieved a complete response and continues to be in remission at this time.  PET scan obtained in December 2021 showed a complete response to therapy without any evidence of relapsed disease.  At this time, I recommended continued active surveillance without any additional treatments.  The plan is to repeat her imaging studies in 3 months.  2.  IV access: Port-A-Cath currently in place and will be flushed today.  Risks and benefits of Port-A-Cath removal were discussed at this time.  3.  Nutrition: We continue to discuss ways to boost her nutritional intake via mouth to decrease her dependence on feeding tube.  At this time she will continue following nutritional services.   4.  Renal function surveillance: Her kidney function continues to improve after treatment without any residual complication related to platinum.    5.  Anemia and leukocytopenia: This is therapy related anticipate recovery with time.  A white cell count is back to normal at this time with a platelet count is normalized.  Her hemoglobin is adequate as well.   8.  Follow-up: She will return in 3 months for a follow-up evaluation.  30  minutes were spent on this encounter.  The time was dedicated to updating disease status, discussing treatment options and outlining future plan of care.   Zola Button, MD 3/29/20222:41 PM

## 2020-07-14 ENCOUNTER — Telehealth: Payer: Self-pay | Admitting: Nutrition

## 2020-07-14 ENCOUNTER — Inpatient Hospital Stay: Payer: Medicare Other | Attending: Oncology | Admitting: Nutrition

## 2020-07-14 NOTE — Progress Notes (Signed)
See telephone note.

## 2020-07-14 NOTE — Telephone Encounter (Signed)
Phone follow-up completed with patient.  She reports her taste alterations continue.  This is what limits her intake.  She is consuming 1 cup of coffee and oatmeal for breakfast, a [place] leg with pinto beans and green beans for lunch.  Dinner varies.  She does not like Ensure or boost.  She reports using her feeding tube twice a day.  She uses 2 cartons of Osmolite 1.5 via her tube at each feeding.  She confirms 4 cartons daily.  Weight decreased and was documented as 97.9 pounds on March 29 down from 103.2 pounds January 5.  Patient is in remission and under observation.  Estimated nutrition needs: 1700-2000 cal, 75-100 g protein, 2.2 L fluid. 4 cartons of Osmolite 1.5 provides 1420 cal and 59.6 g protein  Nutrition diagnosis: Inadequate oral intake continues.  Intervention: Patient needs to continue 4 cartons of Osmolite daily via PEG. Educated patient on importance of increasing oral intake in small amounts throughout the day. Recommended patient consume a milkshake every day to provide additional calories and protein in the hopes that she can reduce feeding tube usage.  She continues to refuse Ensure Plus or boost plus. Encouraged strategies for improving taste alterations. Patient verbalized understanding.  Monitoring, evaluation, goals: Patient will work to increase calories and protein by mouth to minimize weight loss and promote weight gain.  Next visit: Telephone follow-up in about 4 weeks.  **Disclaimer: This note was dictated with voice recognition software. Similar sounding words can inadvertently be transcribed and this note may contain transcription errors which may not have been corrected upon publication of note.**

## 2020-07-20 ENCOUNTER — Ambulatory Visit (HOSPITAL_COMMUNITY)
Admission: RE | Admit: 2020-07-20 | Discharge: 2020-07-20 | Disposition: A | Discharge status: Home / Self Care | Payer: Medicare Other | Source: Ambulatory Visit | Attending: Radiation Oncology | Admitting: Radiation Oncology

## 2020-07-20 ENCOUNTER — Other Ambulatory Visit: Payer: Self-pay

## 2020-07-20 ENCOUNTER — Other Ambulatory Visit: Payer: Self-pay | Admitting: Radiation Oncology

## 2020-07-20 DIAGNOSIS — C139 Malignant neoplasm of hypopharynx, unspecified: Secondary | ICD-10-CM

## 2020-07-20 DIAGNOSIS — K9423 Gastrostomy malfunction: Secondary | ICD-10-CM | POA: Diagnosis not present

## 2020-07-20 HISTORY — PX: IR REPLC GASTRO/COLONIC TUBE PERCUT W/FLUORO: IMG2333

## 2020-07-20 MED ORDER — IOHEXOL 300 MG/ML  SOLN
50.0000 mL | Freq: Once | INTRAMUSCULAR | Status: AC | PRN
  Administered 2020-07-20: 10 mL

## 2020-07-20 MED ORDER — LIDOCAINE VISCOUS HCL 2 % MT SOLN
OROMUCOSAL | Status: AC
  Filled 2020-07-20: qty 15

## 2020-07-20 NOTE — Progress Notes (Signed)
Oncology Nurse Navigator Documentation  Tina Kennedy presented to the radiation nursing clinic reporting that her PEG tube fell out. She continues to use it for supplemental nutrition along with oral nutrition. I called and spoke to Tiffany in IR and reported the issue. IR is able to see the patient today for evaluation and an order was placed for IR eval and management. I accompanied Tina Kennedy up to the registration area for check in. Tina Kennedy knows to call me if she has any further needs or concerns.   Harlow Asa RN, BSN, OCN Head & Neck Oncology Nurse [place] [facility] Phone # [phone]  Fax # [phone]

## 2020-07-20 NOTE — Procedures (Signed)
Interventional Radiology Procedure:   Indications: Dislodged gastrostomy tube  Procedure: Gastrostomy tube replacement  Findings: New 16 Fr balloon retention tube placed  Complications: None     EBL: None  Plan: Gastrostomy tube is ready to be used.     [place] Dann R. Anselm Pancoast, MD  Pager: [phone]

## 2020-07-21 ENCOUNTER — Emergency Department (HOSPITAL_COMMUNITY): Payer: Medicare Other

## 2020-07-21 ENCOUNTER — Telehealth: Payer: Self-pay

## 2020-07-21 ENCOUNTER — Other Ambulatory Visit: Payer: Self-pay

## 2020-07-21 ENCOUNTER — Observation Stay (HOSPITAL_COMMUNITY)
Admission: EM | Admit: 2020-07-21 | Discharge: 2020-07-23 | Disposition: A | Discharge status: Home / Self Care | Payer: Medicare Other | Attending: Internal Medicine | Admitting: Internal Medicine

## 2020-07-21 ENCOUNTER — Encounter (HOSPITAL_COMMUNITY): Payer: Self-pay | Admitting: Emergency Medicine

## 2020-07-21 ENCOUNTER — Observation Stay (HOSPITAL_COMMUNITY): Payer: Medicare Other

## 2020-07-21 DIAGNOSIS — E871 Hypo-osmolality and hyponatremia: Secondary | ICD-10-CM | POA: Insufficient documentation

## 2020-07-21 DIAGNOSIS — F17210 Nicotine dependence, cigarettes, uncomplicated: Secondary | ICD-10-CM | POA: Insufficient documentation

## 2020-07-21 DIAGNOSIS — R109 Unspecified abdominal pain: Secondary | ICD-10-CM | POA: Diagnosis not present

## 2020-07-21 DIAGNOSIS — R0602 Shortness of breath: Secondary | ICD-10-CM | POA: Diagnosis not present

## 2020-07-21 DIAGNOSIS — U071 COVID-19: Secondary | ICD-10-CM | POA: Insufficient documentation

## 2020-07-21 DIAGNOSIS — E878 Other disorders of electrolyte and fluid balance, not elsewhere classified: Secondary | ICD-10-CM | POA: Diagnosis not present

## 2020-07-21 DIAGNOSIS — Z85818 Personal history of malignant neoplasm of other sites of lip, oral cavity, and pharynx: Secondary | ICD-10-CM | POA: Diagnosis not present

## 2020-07-21 DIAGNOSIS — Z9221 Personal history of antineoplastic chemotherapy: Secondary | ICD-10-CM | POA: Insufficient documentation

## 2020-07-21 DIAGNOSIS — R112 Nausea with vomiting, unspecified: Principal | ICD-10-CM | POA: Insufficient documentation

## 2020-07-21 DIAGNOSIS — C139 Malignant neoplasm of hypopharynx, unspecified: Secondary | ICD-10-CM

## 2020-07-21 DIAGNOSIS — Z923 Personal history of irradiation: Secondary | ICD-10-CM | POA: Insufficient documentation

## 2020-07-21 DIAGNOSIS — Z931 Gastrostomy status: Secondary | ICD-10-CM | POA: Diagnosis not present

## 2020-07-21 DIAGNOSIS — K920 Hematemesis: Secondary | ICD-10-CM | POA: Diagnosis not present

## 2020-07-21 DIAGNOSIS — D751 Secondary polycythemia: Secondary | ICD-10-CM | POA: Diagnosis not present

## 2020-07-21 DIAGNOSIS — R030 Elevated blood-pressure reading, without diagnosis of hypertension: Secondary | ICD-10-CM | POA: Diagnosis not present

## 2020-07-21 LAB — LACTATE DEHYDROGENASE: LDH: 133 U/L (ref 98–192)

## 2020-07-21 LAB — CBC
HCT: 48.7 % — ABNORMAL HIGH (ref 36.0–46.0)
Hemoglobin: 18 g/dL — ABNORMAL HIGH (ref 12.0–15.0)
MCH: 31.7 pg (ref 26.0–34.0)
MCHC: 37 g/dL — ABNORMAL HIGH (ref 30.0–36.0)
MCV: 85.9 fL (ref 80.0–100.0)
Platelets: 252 10*3/uL (ref 150–400)
RBC: 5.67 MIL/uL — ABNORMAL HIGH (ref 3.87–5.11)
RDW: 11.9 % (ref 11.5–15.5)
WBC: 7.8 10*3/uL (ref 4.0–10.5)
nRBC: 0 % (ref 0.0–0.2)

## 2020-07-21 LAB — COMPREHENSIVE METABOLIC PANEL
ALT: 14 U/L (ref 0–44)
AST: 30 U/L (ref 15–41)
Albumin: 3.9 g/dL (ref 3.5–5.0)
Alkaline Phosphatase: 103 U/L (ref 38–126)
Anion gap: 13 (ref 5–15)
BUN: 9 mg/dL (ref 8–23)
CO2: 24 mmol/L (ref 22–32)
Calcium: 9.1 mg/dL (ref 8.9–10.3)
Chloride: 90 mmol/L — ABNORMAL LOW (ref 98–111)
Creatinine, Ser: 0.87 mg/dL (ref 0.44–1.00)
GFR, Estimated: >60 mL/min (ref >60)
Glucose, Bld: 124 mg/dL — ABNORMAL HIGH (ref 70–99)
Potassium: 4.3 mmol/L (ref 3.5–5.1)
Sodium: 127 mmol/L — ABNORMAL LOW (ref 135–145)
Total Bilirubin: 1.7 mg/dL — ABNORMAL HIGH (ref 0.3–1.2)
Total Protein: 8.2 g/dL — ABNORMAL HIGH (ref 6.5–8.1)

## 2020-07-21 LAB — LACTIC ACID, PLASMA
Lactic Acid, Venous: 1.1 mmol/L (ref 0.5–1.9)
Lactic Acid, Venous: 1.3 mmol/L (ref 0.5–1.9)

## 2020-07-21 LAB — RESP PANEL BY RT-PCR (FLU A&B, COVID) ARPGX2
Influenza A by PCR: NEGATIVE
Influenza B by PCR: NEGATIVE
SARS Coronavirus 2 by RT PCR: POSITIVE — AB

## 2020-07-21 LAB — D-DIMER, QUANTITATIVE: D-Dimer, Quant: 0.85 ug/mL-FEU — ABNORMAL HIGH (ref 0.00–0.50)

## 2020-07-21 LAB — C-REACTIVE PROTEIN: CRP: 0.5 mg/dL (ref <1.0)

## 2020-07-21 LAB — FIBRINOGEN: Fibrinogen: 301 mg/dL (ref 210–475)

## 2020-07-21 LAB — LIPASE, BLOOD: Lipase: 38 U/L (ref 11–51)

## 2020-07-21 LAB — TRIGLYCERIDES: Triglycerides: 44 mg/dL (ref <150)

## 2020-07-21 LAB — FERRITIN: Ferritin: 77 ng/mL (ref 11–307)

## 2020-07-21 LAB — PROCALCITONIN: Procalcitonin: <0.1 ng/mL

## 2020-07-21 MED ORDER — FENTANYL CITRATE (PF) 100 MCG/2ML IJ SOLN
12.5000 ug | INTRAMUSCULAR | Status: DC | PRN
  Administered 2020-07-21: 12.5 ug via INTRAVENOUS
  Filled 2020-07-21: qty 2

## 2020-07-21 MED ORDER — IOHEXOL 300 MG/ML  SOLN
75.0000 mL | Freq: Once | INTRAMUSCULAR | Status: AC | PRN
  Administered 2020-07-21: 75 mL via INTRAVENOUS

## 2020-07-21 MED ORDER — GUAIFENESIN ER 600 MG PO TB12
600.0000 mg | ORAL_TABLET | Freq: Two times a day (BID) | ORAL | Status: DC
  Administered 2020-07-21 – 2020-07-23 (×4): 600 mg via ORAL
  Filled 2020-07-21 (×4): qty 1

## 2020-07-21 MED ORDER — GUAIFENESIN-DM 100-10 MG/5ML PO SYRP
10.0000 mL | ORAL_SOLUTION | ORAL | Status: DC | PRN
  Administered 2020-07-22: 10 mL via ORAL
  Filled 2020-07-21: qty 10

## 2020-07-21 MED ORDER — SODIUM CHLORIDE 0.9 % IV SOLN
100.0000 mg | Freq: Every day | INTRAVENOUS | Status: AC
  Administered 2020-07-22 – 2020-07-23 (×2): 100 mg via INTRAVENOUS
  Filled 2020-07-21 (×2): qty 20

## 2020-07-21 MED ORDER — ACETAMINOPHEN 650 MG RE SUPP: 650.0000 mg | Freq: Four times a day (QID) | RECTAL | Status: DC | PRN

## 2020-07-21 MED ORDER — LACTATED RINGERS IV SOLN: INTRAVENOUS | Status: DC

## 2020-07-21 MED ORDER — LIDOCAINE 5 % EX OINT
TOPICAL_OINTMENT | Freq: Once | CUTANEOUS | Status: AC
  Administered 2020-07-21: 1 via TOPICAL
  Filled 2020-07-21: qty 35.44

## 2020-07-21 MED ORDER — POLYETHYLENE GLYCOL 3350 17 G PO PACK
17.0000 g | PACK | Freq: Two times a day (BID) | ORAL | Status: DC
  Administered 2020-07-21 – 2020-07-22 (×3): 17 g via ORAL
  Filled 2020-07-21 (×4): qty 1

## 2020-07-21 MED ORDER — FENTANYL CITRATE (PF) 100 MCG/2ML IJ SOLN
25.0000 ug | Freq: Once | INTRAMUSCULAR | Status: AC
Start: 2020-07-21 — End: 2020-07-21
  Administered 2020-07-21: 25 ug via INTRAVENOUS
  Filled 2020-07-21: qty 2

## 2020-07-21 MED ORDER — ENOXAPARIN SODIUM 30 MG/0.3ML ~~LOC~~ SOLN
30.0000 mg | SUBCUTANEOUS | Status: DC
  Administered 2020-07-22: 30 mg via SUBCUTANEOUS
  Filled 2020-07-21 (×2): qty 0.3

## 2020-07-21 MED ORDER — SENNOSIDES-DOCUSATE SODIUM 8.6-50 MG PO TABS
1.0000 | ORAL_TABLET | Freq: Two times a day (BID) | ORAL | Status: DC
  Administered 2020-07-21 – 2020-07-23 (×4): 1 via ORAL
  Filled 2020-07-21 (×4): qty 1

## 2020-07-21 MED ORDER — HYDROCOD POLST-CPM POLST ER 10-8 MG/5ML PO SUER
5.0000 mL | Freq: Two times a day (BID) | ORAL | Status: DC | PRN
  Administered 2020-07-23: 5 mL via ORAL
  Filled 2020-07-21: qty 5

## 2020-07-21 MED ORDER — ASCORBIC ACID 500 MG PO TABS
500.0000 mg | ORAL_TABLET | Freq: Every day | ORAL | Status: DC
  Administered 2020-07-22 – 2020-07-23 (×2): 500 mg via ORAL
  Filled 2020-07-21 (×2): qty 1

## 2020-07-21 MED ORDER — ONDANSETRON HCL 4 MG/2ML IJ SOLN: 4.0000 mg | Freq: Four times a day (QID) | INTRAMUSCULAR | Status: DC | PRN

## 2020-07-21 MED ORDER — PROCHLORPERAZINE EDISYLATE 10 MG/2ML IJ SOLN: 10.0000 mg | Freq: Four times a day (QID) | INTRAMUSCULAR | Status: DC | PRN

## 2020-07-21 MED ORDER — ONDANSETRON HCL 4 MG/2ML IJ SOLN
4.0000 mg | Freq: Once | INTRAMUSCULAR | Status: AC
  Administered 2020-07-21: 4 mg via INTRAVENOUS
  Filled 2020-07-21: qty 2

## 2020-07-21 MED ORDER — BISACODYL 10 MG RE SUPP: 10.0000 mg | Freq: Every day | RECTAL | Status: DC | PRN

## 2020-07-21 MED ORDER — SODIUM CHLORIDE 0.9 % IV SOLN
200.0000 mg | Freq: Once | INTRAVENOUS | Status: AC
  Administered 2020-07-21: 200 mg via INTRAVENOUS
  Filled 2020-07-21: qty 40

## 2020-07-21 MED ORDER — POLYETHYLENE GLYCOL 3350 17 G PO PACK: 17.0000 g | PACK | Freq: Every day | ORAL | Status: DC | PRN

## 2020-07-21 MED ORDER — SODIUM CHLORIDE 0.9 % IV BOLUS
1000.0000 mL | Freq: Once | INTRAVENOUS | Status: AC
  Administered 2020-07-21: 1000 mL via INTRAVENOUS

## 2020-07-21 MED ORDER — IPRATROPIUM-ALBUTEROL 20-100 MCG/ACT IN AERS
1.0000 | INHALATION_SPRAY | Freq: Four times a day (QID) | RESPIRATORY_TRACT | Status: DC
  Administered 2020-07-22 – 2020-07-23 (×4): 1 via RESPIRATORY_TRACT
  Filled 2020-07-21 (×2): qty 4

## 2020-07-21 MED ORDER — PANTOPRAZOLE SODIUM 40 MG IV SOLR
40.0000 mg | Freq: Two times a day (BID) | INTRAVENOUS | Status: DC
  Administered 2020-07-21 – 2020-07-23 (×4): 40 mg via INTRAVENOUS
  Filled 2020-07-21 (×4): qty 40

## 2020-07-21 MED ORDER — ONDANSETRON HCL 4 MG PO TABS: 4.0000 mg | ORAL_TABLET | Freq: Four times a day (QID) | ORAL | Status: DC | PRN

## 2020-07-21 MED ORDER — SODIUM CHLORIDE 0.9 % IV BOLUS
500.0000 mL | Freq: Once | INTRAVENOUS | Status: AC
  Administered 2020-07-21: 500 mL via INTRAVENOUS

## 2020-07-21 MED ORDER — ZINC SULFATE 220 (50 ZN) MG PO CAPS
220.0000 mg | ORAL_CAPSULE | Freq: Every day | ORAL | Status: DC
  Administered 2020-07-22 – 2020-07-23 (×2): 220 mg via ORAL
  Filled 2020-07-21 (×2): qty 1

## 2020-07-21 MED ORDER — HYDRALAZINE HCL 20 MG/ML IJ SOLN
10.0000 mg | Freq: Four times a day (QID) | INTRAMUSCULAR | Status: DC | PRN
  Administered 2020-07-22: 10 mg via INTRAVENOUS
  Filled 2020-07-21 (×2): qty 1

## 2020-07-21 MED ORDER — SODIUM CHLORIDE 0.9 % IV SOLN: INTRAVENOUS | Status: DC

## 2020-07-21 MED ORDER — PANTOPRAZOLE SODIUM 40 MG IV SOLR
40.0000 mg | Freq: Once | INTRAVENOUS | Status: AC
  Administered 2020-07-21: 40 mg via INTRAVENOUS
  Filled 2020-07-21: qty 40

## 2020-07-21 MED ORDER — ACETAMINOPHEN 325 MG PO TABS: 650.0000 mg | ORAL_TABLET | Freq: Four times a day (QID) | ORAL | Status: DC | PRN

## 2020-07-21 NOTE — ED Provider Notes (Signed)
[facility] DEPT Provider Note   CSN: [id number] Arrival date & time: 07/21/20  1022     History Chief Complaint  Patient presents with  . Abdominal Pain    Niana Delores Seward Grater Lariviere is a 70 y.o. female.  HPI  70 yo female complaining of nausea and vomiting and abdominal pain.  G-tube replaced yesterday by IR and reports abdominal pain  and nausea and vomiting.  Patient went to cancer center and sent to ED.  She reports emesis with dark substance but no gross blood.  No fever, chills, or chest pain.  Some generalized weakness. Denies rectal bleeding.      Past Medical History:  Diagnosis Date  . Hypopharyngeal cancer [facility])     Patient Active Problem List   Diagnosis Date Noted  . Port-A-Cath in place 12/26/2019  . 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 Goals of care, counseling/discussion 10/31/2019  . Hypopharyngeal cancer ([place]) 10/28/2019    Past Surgical History:  Procedure Laterality Date  . DIRECT LARYNGOSCOPY N/A 10/15/2019   Procedure: DIRECT LARYNGOSCOPY w/BIOPSY;  Surgeon: Izora Gala, MD;  Location: [facility];  Service: ENT;  Laterality: N/A;  . GASTROSTOMY N/A 12/04/2019   Procedure: LAPAROSCOPIC G TUBE PLACEMENT;  Surgeon: Ralene Ok, MD;  Location: [facility];  Service: General;  Laterality: N/A;  . IR GASTROSTOMY TUBE MOD SED  11/15/2019  . IR IMAGING GUIDED PORT INSERTION  11/15/2019  . IR REPLC GASTRO/COLONIC TUBE PERCUT W/FLUORO  07/20/2020  . RIGID ESOPHAGOSCOPY N/A 10/15/2019   Procedure: RIGID ESOPHAGOSCOPY;  Surgeon: Izora Gala, MD;  Location: [facility];  Service: ENT;  Laterality: N/A;     OB History   No obstetric history on file.     Family History  Problem Relation Age of Onset  . Hypertension Mother   . Hypertension Sister     Social History   Tobacco Use  . Smoking status: Current Some Day Smoker    Packs/day: 0.25    Types: Cigarettes  . Smokeless tobacco: Never Used  . Tobacco  comment: Currently 1-2 cigarettes daily  Vaping Use  . Vaping Use: Never used  Substance Use Topics  . Alcohol use: Not Currently    Alcohol/week: 1.0 standard drink    Types: 1 Cans of beer per week    Comment: Occasional on weekend  . Drug use: Not Currently    Types: Marijuana    Comment: not used in 11/10/2019     Home Medications Prior to Admission medications   Medication Sig Start Date End Date Taking? Authorizing Provider  HYDROcodone-acetaminophen (HYCET) 7.5-325 mg/15 ml solution Take 15 mLs by mouth 4 (four) times daily as needed for moderate pain. Patient not taking: Reported on 04/15/2020 01/10/20 01/09/21  Wyatt Portela, MD  lidocaine (XYLOCAINE) 2 % solution Patient: Mix 1part 2% viscous lidocaine, 1part H20. Swallow 63mL of diluted mixture, 84min before meals and at bedtime, up to QID Patient not taking: Reported on 04/15/2020 11/25/19   Eppie Gibson, MD  lidocaine-prilocaine (EMLA) cream Apply 1 application topically as needed. Patient not taking: Reported on 04/15/2020 10/31/19   Wyatt Portela, MD  Nutritional Supplements (FEEDING SUPPLEMENT, OSMOLITE 1.5 CAL,) LIQD Give 1 carton Osmolite 1.5 via G-tube with 60 mL free water before and after each feeding 5 times daily, 3 hours apart.  This provides greater than 90% estimated needs.  1775 cal, 74.5 g protein, 1505 mL free water.  Please send supplies and formula. 12/10/19  Eppie Gibson, MD  prochlorperazine (COMPAZINE) 10 MG tablet Take 1 tablet (10 mg total) by mouth every 6 (six) hours as needed for nausea or vomiting. Patient not taking: No sig reported 10/31/19   Wyatt Portela, MD    Allergies    Patient has no known allergies.  Review of Systems   Review of Systems  Constitutional: Positive for activity change. Negative for chills, fever and unexpected weight change.  HENT: Negative.   Eyes: Negative.   Respiratory: Negative.   Cardiovascular: Negative.   Gastrointestinal: Positive for abdominal pain, nausea and  vomiting.  Endocrine: Negative.   Genitourinary: Negative.   Musculoskeletal: Negative.   Skin: Negative.   Allergic/Immunologic: Negative.   Neurological: Negative.   Hematological: Negative.   Psychiatric/Behavioral: Negative.   All other systems reviewed and are negative.   Physical Exam Updated Vital Signs BP (!) 160/110 (BP Location: Left Arm)   Pulse (!) 108   Temp 98 F (36.7 C) (Oral)   Resp (!) 22   Ht 1.651 m (5\' 5" )   Wt 44.5 kg   SpO2 100%   BMI 16.31 kg/m   Physical Exam Vitals and nursing note reviewed.  Constitutional:      Appearance: She is well-developed.  HENT:     Head: Normocephalic and atraumatic.     Right Ear: External ear normal.     Left Ear: External ear normal.     Nose: Nose normal.  Eyes:     Conjunctiva/sclera: Conjunctivae normal.     Pupils: Pupils are equal, round, and reactive to light.  Cardiovascular:     Rate and Rhythm: Normal rate and regular rhythm.     Heart sounds: Normal heart sounds.  Pulmonary:     Effort: Pulmonary effort is normal.     Breath sounds: Normal breath sounds.  Abdominal:     General: Abdomen is flat. Bowel sounds are normal.     Palpations: Abdomen is soft.     Tenderness: There is abdominal tenderness.     Hernia: No hernia is present.     Comments: Moderate diffuse tenderness palpation  Musculoskeletal:        General: Normal range of motion.     Cervical back: Normal range of motion and neck supple.  Skin:    General: Skin is warm and dry.  Neurological:     Mental Status: She is alert and oriented to person, place, and time.     Deep Tendon Reflexes: Reflexes are normal and symmetric.  Psychiatric:        Behavior: Behavior normal.        Thought Content: Thought content normal.        Judgment: Judgment normal.     ED Results / Procedures / Treatments   Labs (all labs ordered are listed, but only abnormal results are displayed) Labs Reviewed - No data to  display  EKG None  Radiology CT ABDOMEN PELVIS W CONTRAST  Result Date: 07/21/2020 CLINICAL DATA:  Abdominal pain EXAM: CT ABDOMEN AND PELVIS WITH CONTRAST TECHNIQUE: Multidetector CT imaging of the abdomen and pelvis was performed using the standard protocol following bolus administration of intravenous contrast. CONTRAST:  62mL OMNIPAQUE IOHEXOL 300 MG/ML  SOLN COMPARISON:  PET-CT April 08, 2020 FINDINGS: Lower chest: Lung bases are clear.  There is a small hiatal hernia. Hepatobiliary: No focal liver lesions are appreciable. Gallbladder appears borderline distended without appreciable gallbladder wall thickening. There is no appreciable biliary duct dilatation. Pancreas: There is no  pancreatic mass or inflammatory focus. Spleen: No splenic lesions are evident. Adrenals/Urinary Tract: Adrenals bilaterally appear unremarkable. There is an extrarenal pelvis on each side, an anatomic variant. There is no renal mass or hydronephrosis on either side. There is a junctional parenchymal defect on the left, an anatomic variant. There is no evident renal or ureteral calculus on either side. Urinary bladder is midline with wall thickness within normal limits. Urinary bladder is distended. Stomach/Bowel: The rectum and distal sigmoid colon are distended with stool. There is perirectal wall thickening but no soft tissue stranding or fluid in the perirectal region. There is a degree of relatively mild rectal prolapse. Elsewhere, there is no appreciable bowel wall or mesenteric thickening. There is a gastrostomy catheter positioned in the stomach. There is fold thickening in the gastric antral region without ulceration seen by CT. There is no demonstrable bowel obstruction. The terminal ileum appears normal. There is no evident appendiceal region inflammation. No evident free air or portal venous air. Vascular/Lymphatic: No abdominal aortic aneurysm. There is aortic and iliac artery atherosclerosis. Major venous  structures appear patent. No evident adenopathy in the abdomen or pelvis. Reproductive: Uterus absent.  No adnexal masses are evident. Other: No abscess or ascites is appreciable in the abdomen or pelvis. Musculoskeletal: No blastic or lytic bone lesions. No evident abdominal wall or intramuscular lesions. IMPRESSION: 1. Diffuse distension of the distal sigmoid colon and rectum with stool. No perirectal wall thickening without soft tissue stranding or abscess. No perirectal fluid. There may be a degree of stercoral colitis/proctitis. There is a degree of rectal prolapse. 2. Urinary bladder somewhat distended without urinary bladder wall thickening. 3. Gastrostomy catheter positioned in the stomach. Wall thickening in the distal stomach felt to represent a degree of antral gastritis. 4. No evident small bowel obstruction. No abscess in the abdomen or pelvis. 5.  No evident renal or ureteral calculus.  No hydronephrosis. 6.  Small hiatal hernia. 7.  Apparent absence of the uterus. 8.  Aortic Atherosclerosis (ICD10-I70.0). 9. Gallbladder borderline prominent in size without wall thickening. No biliary duct dilatation appreciable by CT. Electronically Signed   By: Lowella Grip III M.D.   On: 07/21/2020 14:06   IR Replc Gastro/Colonic Tube Percut W/Fluoro  Result Date: 07/20/2020 INDICATION: 70 year old with history of hypopharyngeal cancer and history of laparoscopically placed gastrostomy tube. Gastrostomy tube was recently dislodged and patient needs tube replacement. EXAM: REPLACEMENT OF GASTROSTOMY TUBE WITH FLUOROSCOPY MEDICATIONS: Viscous lidocaine ANESTHESIA/SEDATION: Viscous lidocaine CONTRAST:  10 mL Omnipaque 300-administered into the gastric lumen. FLUOROSCOPY TIME:  Fluoroscopy Time: 1 minutes, 30 seconds, 6 mGy COMPLICATIONS: None immediate. PROCEDURE: Informed written consent was obtained from the patient after a thorough discussion of the procedural risks, benefits and alternatives. All questions  were addressed. A timeout was performed prior to the initiation of the procedure. Initially the interventional radiology technologist tried to advance a new tube through the existing hole but this was unsuccessful. Therefore, the anterior abdomen and the gastrostomy site were prepped and draped in sterile fashion. Maximal barrier sterile technique was utilized including caps, mask, sterile gowns, sterile gloves, sterile drape, hand hygiene and skin antiseptic. Five French catheter was advanced into the stomach using fluoroscopic guidance and contrast injection confirmed placement in the stomach. Super stiff Amplatz wire was placed in the stomach. Attempted to dilate the tract to 72 [place] but this was very uncomfortable for the patient. Therefore, the tract was only dilated to 38 [place]. After dilatation, 16 French Entuit catheter was easily advanced  over the wire. Balloon was inflated with 6 mL of saline. Contrast injection confirmed placement in the stomach. Catheter was flushed with saline. Fluoroscopic images were taken and saved for this procedure. IMPRESSION: Successful replacement of the percutaneous gastrostomy tube. Patient currently has a 68 French balloon retention gastrostomy tube. Electronically Signed   By: Markus Daft M.D.   On: 07/20/2020 12:58   DG Chest Port 1 View  Result Date: 07/21/2020 CLINICAL DATA:  Abdominal pain EXAM: PORTABLE CHEST 1 VIEW COMPARISON:  None. FINDINGS: Right Port-A-Cath in place with the tip in the SVC. Heart and mediastinal contours are within normal limits. No focal opacities or effusions. No acute bony abnormality. IMPRESSION: No active disease. Electronically Signed   By: Rolm Baptise M.D.   On: 07/21/2020 12:48    Procedures Procedures   Medications Ordered in ED Medications - No data to display  ED Course  I have reviewed the triage vital signs and the nursing notes.  Pertinent labs & imaging results that were available during my care of the patient were  reviewed by me and considered in my medical decision making (see chart for details).    MDM Rules/Calculators/A&P                          70 year old female history of laryngeal cancer presents today with abdominal pain and nausea and vomiting.  She had her G-tube replaced yesterday.  CT obtained with stool but no acute intraabdominal findings to explain pain.  She has had multiple oral fluid trials but continues to vomit. Covid test returned and positive.  Discussed with Dr. Legrand Pitts ordered per pharmacy consult   Final Clinical Impression(s) / ED Diagnoses Final diagnoses:  Abdominal pain, unspecified abdominal location  Nausea and vomiting, intractability of vomiting not specified, unspecified vomiting type  COVID    Rx / DC Orders ED Discharge Orders    None       Pattricia Boss, MD 07/21/20 1619

## 2020-07-21 NOTE — ED Notes (Signed)
Pt given water and crackers for PO challenge

## 2020-07-21 NOTE — Telephone Encounter (Signed)
Patient's sister Lola updated on patient's status and what room she is in in the ED. Advised her to head to ED waiting room and that a staff member will call her back once patient is settled and stable. Voiced understanding and appreciation of call

## 2020-07-21 NOTE — ED Triage Notes (Signed)
Pt arrived via POV to [facility] today with c/o severe abdominal pain. Pt reports peg tube came out over the weekend and IR replaced tube yesterday 4/11. Since tube replacement pt has reported increased abdominal pain and "coffee ground" emesis. Pt also reports "nervousness"

## 2020-07-21 NOTE — ED Notes (Signed)
Patient transported to CT 

## 2020-07-21 NOTE — ED Notes (Signed)
Patient vomited multiple times after PO challenge.  Ray, EDP aware.

## 2020-07-21 NOTE — Progress Notes (Addendum)
Patient again presented to [facility] complaining of issues with PEG tube. She states after she went home from IR she began feeling poorly, and her symptoms worsened as the evening progressed. She reports uncontrollable shaking, intermittent vomiting (most recently with what coffee ground appearing emesis), and inability to keep any liquids down. Spoke with Dr. Laurence Ferrari in IR who stated patient's condition does not sound to be related to PEG tube exchange, and patient should proceed to ED for more indepth work up. Called ED and spoke with Patty-charge nurse. Advised to bring patient to bay 10. Patient will be escorted over via wheelchair and handoff given to Quaysha-RN    Vitals at 10:01 BP: 157/86 (right arm, sitting) HR: 98 T: 97.7 (oral) O2: 100% (RA)  RR: 28

## 2020-07-21 NOTE — H&P (Signed)
History and Physical    Tina Kennedy RXV:[id number] DOB: [birth year] DOA: 07/21/2020  PCP: Patient, No Pcp Per (Inactive)   Patient coming from: Home  Chief Complaint: Intractable Nausea, Vomiting, and Abdominal Pain   HPI: Tina Kennedy is a 70 y.o. 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  Interventional radiology was consulted and she had her PEG replaced when she went home and then represented to the cancer center complaining of issues with her PEG and she states she began feeling poorly and started having worsening symptoms with uncontrollable shaking, intermittent vomiting what appeared to be ground emesis as well as inability to keep anything down.  She is advised to go to the ED for further evaluation recommendations as the interventional radiologist do not feel it is a problem with her PEG.  She continued to have some nausea vomiting despite several antiemetics and fluid challenge.  Continues to also have abdominal pain.  She denies any fevers, chills or chest pain.  No rectal bleeding but did have some generalized weakness.  She continues to have nausea at the time of my evaluation 2.  TRH was asked admit this patient for intractable nausea vomiting and abdominal pain concern for coffee-ground emesis and incidentally patient was Covid positive.  ED Course: She had basic blood work done and initiated on remdesivir.  CT of the abdomen pelvis was also done in the ED.  Patient was given IV fluid hydration and antiemetics and pain control in the ED  Review of Systems: As per HPI otherwise all other systems reviewed and negative.   Past Medical History:  Diagnosis Date  . Hypopharyngeal cancer [facility])    Past Surgical History:  Procedure Laterality Date  .  DIRECT LARYNGOSCOPY N/A 10/15/2019   Procedure: DIRECT LARYNGOSCOPY w/BIOPSY;  Surgeon: Izora Gala, MD;  Location: [place];  Service: ENT;  Laterality: N/A;  . GASTROSTOMY N/A 12/04/2019   Procedure: LAPAROSCOPIC G TUBE PLACEMENT;  Surgeon: Ralene Ok, MD;  Location: [facility];  Service: General;  Laterality: N/A;  . IR GASTROSTOMY TUBE MOD SED  11/15/2019  . IR IMAGING GUIDED PORT INSERTION  11/15/2019  . IR REPLC GASTRO/COLONIC TUBE PERCUT W/FLUORO  07/20/2020  . RIGID ESOPHAGOSCOPY N/A 10/15/2019   Procedure: RIGID ESOPHAGOSCOPY;  Surgeon: Izora Gala, MD;  Location: [place];  Service: ENT;  Laterality: N/A;   SOCIAL  reports that she has been smoking cigarettes. She has been smoking about 0.25 packs per day. She has never used smokeless tobacco. She reports previous alcohol use of about 1.0 standard drink of alcohol per week. She reports previous drug use. Drug: Marijuana.  ALLERGIES No Known Allergies  Family History  Problem Relation Age of Onset  . Hypertension Mother   . Hypertension Sister    Prior to Admission medications   Medication Sig Start Date End Date Taking? Authorizing Provider  HYDROcodone-acetaminophen (HYCET) 7.5-325 mg/15 ml solution Take 15 mLs by mouth 4 (four) times daily as needed for moderate pain. Patient not taking: No sig reported 01/10/20 01/09/21  Wyatt Portela, MD  lidocaine (XYLOCAINE) 2 % solution Patient: Mix 1part 2% viscous lidocaine, 1part H20. Swallow 25mL of diluted mixture, 37min before meals and at bedtime, up to QID Patient not taking: Reported on 04/15/2020 11/25/19   Eppie Gibson, MD  lidocaine-prilocaine (EMLA) cream Apply  1 application topically as needed. Patient not taking: Reported on 04/15/2020 10/31/19   Wyatt Portela, MD  Nutritional Supplements (FEEDING SUPPLEMENT, OSMOLITE 1.5 CAL,) LIQD Give 1 carton Osmolite 1.5 via G-tube with 60 mL free water before and after each feeding 5 times daily, 3 hours apart.  This provides greater than 90%  estimated needs.  1775 cal, 74.5 g protein, 1505 mL free water.  Please send supplies and formula. 12/10/19   Eppie Gibson, MD  prochlorperazine (COMPAZINE) 10 MG tablet Take 1 tablet (10 mg total) by mouth every 6 (six) hours as needed for nausea or vomiting. Patient not taking: No sig reported 10/31/19   Wyatt Portela, MD   Physical Exam: Vitals:   07/21/20 1320 07/21/20 1442 07/21/20 1500 07/21/20 1533  BP: (!) 186/92 (!) 178/102 (!) 184/93 (!) 186/94  Pulse: 82 90 82 91  Resp: 15 14 (!) 25 15  Temp:      TempSrc:      SpO2: 99% 99% 97% 99%  Weight:      Height:       Constitutional: Extremely thin and chronically ill-appearing African-American female currently in mild distress appears somewhat uncomfortable and feels nauseous Eyes: Lids and conjunctivae normal, sclerae anicteric  ENMT: External Ears, Nose appear normal. Grossly normal hearing. .  Neck: Appears normal, supple, no cervical masses, normal ROM, no appreciable thyromegaly; no JVD Respiratory: Diminished to auscultation bilaterally, no wheezing, rales, rhonchi or crackles. Normal respiratory effort and patient is not tachypenic. No accessory muscle use.  Cardiovascular: Mildly tachycardic, no murmurs / rubs / gallops. S1 and S2 auscultated.  No appreciable extremity edema Abdomen: Soft, palpate, non-distended. No masses palpated. Bowel sounds positive.  PEG tube is in place GU: Deferred. Musculoskeletal: No clubbing / cyanosis of digits/nails. No joint deformity upper and lower extremities.  Skin: No rashes, lesions, ulcers on limited skin evaluation. No induration; Warm and dry.  Neurologic: CN 2-12 grossly intact with no focal deficits. Romberg sign and cerebellar reflexes not assessed.  Psychiatric: Normal judgment and insight. Alert and oriented x 3. Normal mood and appropriate affect.   Labs on Admission: I have personally reviewed following labs and imaging studies  CBC: Recent Labs  Lab 07/21/20 1109  WBC 7.8   HGB 18.0*  HCT 48.7*  MCV 85.9  PLT 956   Basic Metabolic Panel: Recent Labs  Lab 07/21/20 1109  NA 127*  K 4.3  CL 90*  CO2 24  GLUCOSE 124*  BUN 9  CREATININE 0.87  CALCIUM 9.1   GFR: Estimated Creatinine Clearance: 43.5 mL/min (by C-G formula based on SCr of 0.87 mg/dL). Liver Function Tests: Recent Labs  Lab 07/21/20 1109  AST 30  ALT 14  ALKPHOS 103  BILITOT 1.7*  PROT 8.2*  ALBUMIN 3.9   Recent Labs  Lab 07/21/20 1109  LIPASE 38   No results for input(s): AMMONIA in the last 168 hours. Coagulation Profile: No results for input(s): INR, PROTIME in the last 168 hours. Cardiac Enzymes: No results for input(s): CKTOTAL, CKMB, CKMBINDEX, TROPONINI in the last 168 hours. BNP (last 3 results) No results for input(s): PROBNP in the last 8760 hours. HbA1C: No results for input(s): HGBA1C in the last 72 hours. CBG: No results for input(s): GLUCAP in the last 168 hours. Lipid Profile: No results for input(s): CHOL, HDL, LDLCALC, TRIG, CHOLHDL, LDLDIRECT in the last 72 hours. Thyroid Function Tests: No results for input(s): TSH, T4TOTAL, FREET4, T3FREE, THYROIDAB in the last 72 hours. Anemia  Panel: No results for input(s): VITAMINB12, FOLATE, FERRITIN, TIBC, IRON, RETICCTPCT in the last 72 hours. Urine analysis:    Component Value Date/Time   BILIRUBINUR neg 12/24/2013 1653   PROTEINUR neg 12/24/2013 1653   UROBILINOGEN 1.0 12/24/2013 1653   NITRITE neg 12/24/2013 1653   LEUKOCYTESUR Trace 12/24/2013 1653   Sepsis Labs: !!!!!!!!!!!!!!!!!!!!!!!!!!!!!!!!!!!!!!!!!!!! @LABRCNTIP (procalcitonin:4,lacticidven:4) ) Recent Results (from the past 240 hour(s))  Resp Panel by RT-PCR (Flu A&B, Covid) Nasopharyngeal Swab     Status: Abnormal   Collection Time: 07/21/20 11:10 AM   Specimen: Nasopharyngeal Swab; Nasopharyngeal(NP) swabs in vial transport medium  Result Value Ref Range Status   SARS Coronavirus 2 by RT PCR POSITIVE (A) NEGATIVE Final    Comment:  RESULT CALLED TO, READ BACK BY AND VERIFIED WITH: CONRAD,Q. RN AT 8242 07/21/20 MULLINS,T (NOTE) SARS-CoV-2 target nucleic acids are DETECTED.  The SARS-CoV-2 RNA is generally detectable in upper respiratory specimens during the acute phase of infection. Positive results are indicative of the presence of the identified virus, but do not rule out bacterial infection or co-infection with other pathogens not detected by the test. Clinical correlation with patient history and other diagnostic information is necessary to determine patient infection status. The expected result is Negative.  Fact Sheet for Patients: EntrepreneurPulse.com.au  Fact Sheet for Healthcare Providers: IncredibleEmployment.be  This test is not yet approved or cleared by the [place] FDA and  has been authorized for detection and/or diagnosis of SARS-CoV-2 by FDA under an Emergency Use Authorization (EUA).  This EUA will remain in effect (meaning this test  can be used) for the duration of  the COVID-19 declaration under Section 564(b)(1) of the Act, 21 U.S.C. section 360bbb-3(b)(1), unless the authorization is terminated or revoked sooner.     Influenza A by PCR NEGATIVE NEGATIVE Final   Influenza B by PCR NEGATIVE NEGATIVE Final    Comment: (NOTE) The Xpert Xpress SARS-CoV-2/FLU/RSV plus assay is intended as an aid in the diagnosis of influenza from Nasopharyngeal swab specimens and should not be used as a sole basis for treatment. Nasal washings and aspirates are unacceptable for Xpert Xpress SARS-CoV-2/FLU/RSV testing.  Fact Sheet for Patients: EntrepreneurPulse.com.au  Fact Sheet for Healthcare Providers: IncredibleEmployment.be  This test is not yet approved or cleared by the [place] FDA and has been authorized for detection and/or diagnosis of SARS-CoV-2 by FDA under an Emergency Use Authorization (EUA). This EUA will  remain in effect (meaning this test can be used) for the duration of the COVID-19 declaration under Section 564(b)(1) of the Act, 21 U.S.C. section 360bbb-3(b)(1), unless the authorization is terminated or revoked.  Performed at [facility], [place] [address] [place], [place] [address]     Radiological Exams on Admission: CT ABDOMEN PELVIS W CONTRAST  Result Date: 07/21/2020 CLINICAL DATA:  Abdominal pain EXAM: CT ABDOMEN AND PELVIS WITH CONTRAST TECHNIQUE: Multidetector CT imaging of the abdomen and pelvis was performed using the standard protocol following bolus administration of intravenous contrast. CONTRAST:  58mL OMNIPAQUE IOHEXOL 300 MG/ML  SOLN COMPARISON:  PET-CT April 08, 2020 FINDINGS: Lower chest: Lung bases are clear.  There is a small hiatal hernia. Hepatobiliary: No focal liver lesions are appreciable. Gallbladder appears borderline distended without appreciable gallbladder wall thickening. There is no appreciable biliary duct dilatation. Pancreas: There is no pancreatic mass or inflammatory focus. Spleen: No splenic lesions are evident. Adrenals/Urinary Tract: Adrenals bilaterally appear unremarkable. There is an extrarenal pelvis on each side, an anatomic variant. There is no renal  mass or hydronephrosis on either side. There is a junctional parenchymal defect on the left, an anatomic variant. There is no evident renal or ureteral calculus on either side. Urinary bladder is midline with wall thickness within normal limits. Urinary bladder is distended. Stomach/Bowel: The rectum and distal sigmoid colon are distended with stool. There is perirectal wall thickening but no soft tissue stranding or fluid in the perirectal region. There is a degree of relatively mild rectal prolapse. Elsewhere, there is no appreciable bowel wall or mesenteric thickening. There is a gastrostomy catheter positioned in the stomach. There is fold thickening in the gastric antral region  without ulceration seen by CT. There is no demonstrable bowel obstruction. The terminal ileum appears normal. There is no evident appendiceal region inflammation. No evident free air or portal venous air. Vascular/Lymphatic: No abdominal aortic aneurysm. There is aortic and iliac artery atherosclerosis. Major venous structures appear patent. No evident adenopathy in the abdomen or pelvis. Reproductive: Uterus absent.  No adnexal masses are evident. Other: No abscess or ascites is appreciable in the abdomen or pelvis. Musculoskeletal: No blastic or lytic bone lesions. No evident abdominal wall or intramuscular lesions. IMPRESSION: 1. Diffuse distension of the distal sigmoid colon and rectum with stool. No perirectal wall thickening without soft tissue stranding or abscess. No perirectal fluid. There may be a degree of stercoral colitis/proctitis. There is a degree of rectal prolapse. 2. Urinary bladder somewhat distended without urinary bladder wall thickening. 3. Gastrostomy catheter positioned in the stomach. Wall thickening in the distal stomach felt to represent a degree of antral gastritis. 4. No evident small bowel obstruction. No abscess in the abdomen or pelvis. 5.  No evident renal or ureteral calculus.  No hydronephrosis. 6.  Small hiatal hernia. 7.  Apparent absence of the uterus. 8.  Aortic Atherosclerosis (ICD10-I70.0). 9. Gallbladder borderline prominent in size without wall thickening. No biliary duct dilatation appreciable by CT. Electronically Signed   By: Lowella Grip III M.D.   On: 07/21/2020 14:06   IR Replc Gastro/Colonic Tube Percut W/Fluoro  Result Date: 07/20/2020 INDICATION: 70 year old with history of hypopharyngeal cancer and history of laparoscopically placed gastrostomy tube. Gastrostomy tube was recently dislodged and patient needs tube replacement. EXAM: REPLACEMENT OF GASTROSTOMY TUBE WITH FLUOROSCOPY MEDICATIONS: Viscous lidocaine ANESTHESIA/SEDATION: Viscous lidocaine  CONTRAST:  10 mL Omnipaque 300-administered into the gastric lumen. FLUOROSCOPY TIME:  Fluoroscopy Time: 1 minutes, 30 seconds, 6 mGy COMPLICATIONS: None immediate. PROCEDURE: Informed written consent was obtained from the patient after a thorough discussion of the procedural risks, benefits and alternatives. All questions were addressed. A timeout was performed prior to the initiation of the procedure. Initially the interventional radiology technologist tried to advance a new tube through the existing hole but this was unsuccessful. Therefore, the anterior abdomen and the gastrostomy site were prepped and draped in sterile fashion. Maximal barrier sterile technique was utilized including caps, mask, sterile gowns, sterile gloves, sterile drape, hand hygiene and skin antiseptic. Five French catheter was advanced into the stomach using fluoroscopic guidance and contrast injection confirmed placement in the stomach. Super stiff Amplatz wire was placed in the stomach. Attempted to dilate the tract to 64 [place] but this was very uncomfortable for the patient. Therefore, the tract was only dilated to 24 [place]. After dilatation, 16 French Entuit catheter was easily advanced over the wire. Balloon was inflated with 6 mL of saline. Contrast injection confirmed placement in the stomach. Catheter was flushed with saline. Fluoroscopic images were taken and saved for this procedure.  IMPRESSION: Successful replacement of the percutaneous gastrostomy tube. Patient currently has a 70 French balloon retention gastrostomy tube. Electronically Signed   By: Markus Daft M.D.   On: 07/20/2020 12:58   DG Chest Port 1 View  Result Date: 07/21/2020 CLINICAL DATA:  Abdominal pain EXAM: PORTABLE CHEST 1 VIEW COMPARISON:  None. FINDINGS: Right Port-A-Cath in place with the tip in the SVC. Heart and mediastinal contours are within normal limits. No focal opacities or effusions. No acute bony abnormality. IMPRESSION: No active disease.  Electronically Signed   By: Rolm Baptise M.D.   On: 07/21/2020 12:48    EKG: Independently reviewed. Showed a Sinus Rhythm of a rate of 99 and QTc of 487 and low voltage.  Assessment/Plan Active Problems:   Intractable nausea and vomiting  Intractable Nausea and Vomiting associated with Abodminal Pain -Recently had her G tube replaced yesterday -Presented after with intractable nausea vomiting abdominal pain and failed a p.o. challenge by the EDP -Required multiple doses of antiemetics and still feels nauseous -CT Abd/Pelvis showed "Diffuse distension of the distal sigmoid colon and rectum with stool. No perirectal wall thickening without soft tissue stranding or abscess. No perirectal fluid. There may be a degree of stercoral colitis/proctitis. There is a degree of rectal prolapse. Urinary bladder somewhat distended without urinary bladder wall thickening. Gastrostomy catheter positioned in the stomach. Wall thickening in the distal stomach felt to represent a degree of antral gastritis. No evident small bowel obstruction. No abscess in the abdomen or pelvis. No evident renal or ureteral calculus.  No hydronephrosis. Small hiatal hernia. Apparent absence of the uterus.   Aortic Atherosclerosis.Gallbladder borderline prominent in size without wall thickening. No biliary duct dilatation appreciable by CT. -Check Blood Cx x2  -C/w Supportive Care and c/w Antiemetics with po/IV Zofarn   Coffee Ground Emesis -Probably in the setting of Gastritis -Start IV PPI BID -Consult GI for further evaluation and Recommendations; Have notified [facility] via [place] for Now -Check H/H q6h   Constipation  -CT scan showed diffuse distention of the distal sigmoid colon and rectum with stool; there is concern for stercoral colitis and proctitis -Initiate bowel regimen with Miralax 17 grams po BID, Senna-Docusate 1 Tab po BID and Bisacodyl 10 mg RC  Elevated BP -Does not have a Hx of HTN -BP elevated  in the Setting of Nausea, Vomiting and Abdominal Pain -Continue to Monitor BP per Protocol -Added IV Hydralazine 10 mg q6hprn SBP >180 or DBP>100  Hyponatremia/Hypochloremia -Mild at 127 and 90 respectively -C/w IVF with NS at 75 mL/hr  Hyperbilirubinemia -Likely Reactive as T Bili went from 0.7 -> 1.7 -C/w IVF as above -If continues to [place] will obtain a RUQ U/S -Repeat CMP in the AM   Erythrocytosis  -Patient's Hgb/Hct went from 15.7/43.3 -> 18.0/48.7 -In the setting of Dehydration, Nausea and Vomiting -Continue to Monitor and Trend -Repeat CBC in the AM   COVID-19+ -Incidental Finding and currently Asmptomatic  -Check CXR -Check Inflammatory Markers: Recent Labs    07/21/20 1650  DDIMER 0.85*  LDH 133    Lab Results  Component Value Date   SARSCOV2NAA POSITIVE (A) 07/21/2020   [place] NEGATIVE 12/04/2019   [place] NEGATIVE 11/13/2019   [place] NEGATIVE 10/15/2019  -SpO2: 100 % on Room Air -Empirically start Remedsevir for 3 Days  -Airborne and Contact Precautions  -Antitussives  -DO not feel the need for Steroids given Concern for GIB  DVT prophylaxis: SCDs Code Status: FULL CODE Family Communication: No  family present at bedside  Disposition Plan: Pending further workup and improvement  Consults called: [facility] Dr. Therisa Doyne Admission status: Observation Telemetry   Severity of Illness: The appropriate patient status for this patient is OBSERVATION. Observation status is judged to be reasonable and necessary in order to provide the required intensity of service to ensure the patient's safety. The patient's presenting symptoms, physical exam findings, and initial radiographic and laboratory data in the context of their medical condition is felt to place them at decreased risk for further clinical deterioration. Furthermore, it is anticipated that the patient will be medically stable for discharge from the hospital within 2 midnights of  admission. The following factors support the patient status of observation.   " The patient's presenting symptoms include Nausea, Vomiting, and Abdominal Pain. " The physical exam findings include Abdominal Tenderness . " The initial radiographic and laboratory data are concerning for Gastritis and COVID+.  Kerney Elbe, D.O. [facility] PAGER is on [place]  If 7PM-7AM, please contact night-coverage [email or web address]  07/21/2020, 4:13 PM

## 2020-07-22 ENCOUNTER — Observation Stay (HOSPITAL_COMMUNITY): Payer: Medicare Other

## 2020-07-22 DIAGNOSIS — C139 Malignant neoplasm of hypopharynx, unspecified: Secondary | ICD-10-CM | POA: Diagnosis not present

## 2020-07-22 DIAGNOSIS — E871 Hypo-osmolality and hyponatremia: Secondary | ICD-10-CM | POA: Diagnosis not present

## 2020-07-22 DIAGNOSIS — R112 Nausea with vomiting, unspecified: Secondary | ICD-10-CM | POA: Diagnosis not present

## 2020-07-22 DIAGNOSIS — U071 COVID-19: Secondary | ICD-10-CM | POA: Diagnosis not present

## 2020-07-22 DIAGNOSIS — R0602 Shortness of breath: Secondary | ICD-10-CM | POA: Diagnosis not present

## 2020-07-22 DIAGNOSIS — K920 Hematemesis: Secondary | ICD-10-CM | POA: Diagnosis not present

## 2020-07-22 DIAGNOSIS — K922 Gastrointestinal hemorrhage, unspecified: Secondary | ICD-10-CM | POA: Diagnosis not present

## 2020-07-22 DIAGNOSIS — K9420 Gastrostomy complication, unspecified: Secondary | ICD-10-CM | POA: Diagnosis not present

## 2020-07-22 DIAGNOSIS — E878 Other disorders of electrolyte and fluid balance, not elsewhere classified: Secondary | ICD-10-CM | POA: Diagnosis not present

## 2020-07-22 LAB — CBC WITH DIFFERENTIAL/PLATELET
Abs Immature Granulocytes: 0.02 10*3/uL (ref 0.00–0.07)
Basophils Absolute: 0 10*3/uL (ref 0.0–0.1)
Basophils Relative: 0 %
Eosinophils Absolute: 0 10*3/uL (ref 0.0–0.5)
Eosinophils Relative: 0 %
HCT: 42.7 % (ref 36.0–46.0)
Hemoglobin: 15.5 g/dL — ABNORMAL HIGH (ref 12.0–15.0)
Immature Granulocytes: 0 %
Lymphocytes Relative: 9 %
Lymphs Abs: 0.4 10*3/uL — ABNORMAL LOW (ref 0.7–4.0)
MCH: 31.8 pg (ref 26.0–34.0)
MCHC: 36.3 g/dL — ABNORMAL HIGH (ref 30.0–36.0)
MCV: 87.5 fL (ref 80.0–100.0)
Monocytes Absolute: 0.6 10*3/uL (ref 0.1–1.0)
Monocytes Relative: 13 %
Neutro Abs: 3.6 10*3/uL (ref 1.7–7.7)
Neutrophils Relative %: 78 %
Platelets: 223 10*3/uL (ref 150–400)
RBC: 4.88 MIL/uL (ref 3.87–5.11)
RDW: 12.4 % (ref 11.5–15.5)
WBC: 4.7 10*3/uL (ref 4.0–10.5)
nRBC: 0 % (ref 0.0–0.2)

## 2020-07-22 LAB — FERRITIN: Ferritin: 71 ng/mL (ref 11–307)

## 2020-07-22 LAB — GLUCOSE, CAPILLARY
Glucose-Capillary: 149 mg/dL — ABNORMAL HIGH (ref 70–99)
Glucose-Capillary: 155 mg/dL — ABNORMAL HIGH (ref 70–99)
Glucose-Capillary: 92 mg/dL (ref 70–99)

## 2020-07-22 LAB — HEMOGLOBIN AND HEMATOCRIT, BLOOD
HCT: 39.2 % (ref 36.0–46.0)
Hemoglobin: 14.5 g/dL (ref 12.0–15.0)

## 2020-07-22 LAB — TROPONIN I (HIGH SENSITIVITY): Troponin I (High Sensitivity): 9 ng/L (ref <18)

## 2020-07-22 LAB — C-REACTIVE PROTEIN: CRP: <0.5 mg/dL (ref <1.0)

## 2020-07-22 MED ORDER — PROSOURCE PLUS PO LIQD: 30.0000 mL | Freq: Two times a day (BID) | ORAL | Status: DC

## 2020-07-22 MED ORDER — PROSOURCE TF PO LIQD
45.0000 mL | Freq: Two times a day (BID) | ORAL | Status: DC
  Administered 2020-07-22: 45 mL
  Filled 2020-07-22 (×5): qty 45

## 2020-07-22 MED ORDER — BOOST / RESOURCE BREEZE PO LIQD CUSTOM
1.0000 | Freq: Two times a day (BID) | ORAL | Status: DC
  Administered 2020-07-22: 1 via ORAL

## 2020-07-22 MED ORDER — ADULT MULTIVITAMIN W/MINERALS CH
1.0000 | ORAL_TABLET | Freq: Every day | ORAL | Status: DC
  Administered 2020-07-22 – 2020-07-23 (×2): 1 via ORAL
  Filled 2020-07-22: qty 1

## 2020-07-22 MED ORDER — CHLORHEXIDINE GLUCONATE CLOTH 2 % EX PADS
6.0000 | MEDICATED_PAD | Freq: Every day | CUTANEOUS | Status: DC
  Administered 2020-07-22: 6 via TOPICAL

## 2020-07-22 MED ORDER — BOOST / RESOURCE BREEZE PO LIQD CUSTOM: 1.0000 | Freq: Two times a day (BID) | ORAL | Status: DC

## 2020-07-22 MED ORDER — OSMOLITE 1.2 CAL PO LIQD: 1000.0000 mL | ORAL | Status: DC

## 2020-07-22 MED ORDER — FREE WATER
100.0000 mL | Freq: Two times a day (BID) | Status: DC
  Administered 2020-07-22 – 2020-07-23 (×3): 100 mL

## 2020-07-22 MED ORDER — OSMOLITE 1.5 CAL PO LIQD
237.0000 mL | Freq: Two times a day (BID) | ORAL | Status: DC
  Administered 2020-07-22 – 2020-07-23 (×3): 237 mL
  Filled 2020-07-22 (×5): qty 237

## 2020-07-22 NOTE — Progress Notes (Signed)
PROGRESS NOTE    Tina Kennedy  WJX:[id number] DOB: [birth year] DOA: 07/21/2020 PCP: Patient, No Pcp Per (Inactive)    Brief Narrative:  Tina Kennedy is a 70 year old female with past medical history significant for hypopharyngeal cancer s/p gastrostomy tube placement    Assessment & Plan:   Active Problems:   Hypopharyngeal cancer (HCC)   Intractable nausea and vomiting   Coffee ground emesis   Hyponatremia   Hypochloremia   Hyperbilirubinemia   Erythrocytosis   COVID-19 virus infection   Coffee-ground emesis likely secondary to gastritis Vs MW tear Patient presenting to the ED with complaints of coffee-ground emesis.  Etiology likely secondary to Mallory-Weiss tear versus esophagitis versus gastric irritation from dilating her PEG/G-tube tract and replacing tube on 07/20/2020.  Hemoglobin stable, 15.5.  Seen by gastroenterology and recommending supportive care, observation and advancing diet as tolerates.  No indication for EGD at this time.  She will also require a larger G-tube so she can maintain adequate feedings, IR wants to wait until off isolation precautions. 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 Alen Blew outpatient.  Initially diagnosed October 15, 2019 following direct lung uroscopy and biopsy.  Completed 6 cycles of cisplatin chemotherapy, currently with radiation on December 26, 2019.  Has PEG tube placement to assist with nutrition supplementation given her continued difficulty with solids orally.  Patient had dislodged gastrostomy tube 07/21/2019 which was replaced by interventional radiology with a 16 French G-tube by Dr. Anselm Pancoast.  Gastroenterology, Dr. Michail Sermon requesting upsize of G-tube, IR would like to wait until off of isolation precautions before proceeding. --Nutrition  consulted for continued tube feeds --Soft diet as tolerates --Outpatient follow-up with medical oncology as scheduled --IR outpatient follow-up in 10 days for upsizing G-tube  Covid-19 viral infection, incidental finding Asymptomatic.  Oxygenating well on room air.  Has been vaccinate against Covid-19 x 3 with booster in Nov 2021.  --Remdesivir Day #2/3 --Combivent MDI 1 puff every 6 hours --Vitamin C/zinc --Continue supportive care --Airborne/contact isolation   DVT prophylaxis: SCDs   Code Status: Full Code Family Communication: Updated patient extensively at bedside  Disposition Plan:  Level of care: Telemetry Status is: Observation  The patient remains OBS appropriate and will d/c before 2 midnights.  Dispo: The patient is from: Home              Anticipated d/c is to: Home              Patient currently is not medically stable to d/c.   Difficult to place patient No    Consultants:   Interventional radiology  [facility]  Procedures:   None  Antimicrobials:   None   Subjective: Patient seen and examined bedside, resting comfortably.  Reports abdominal discomfort and nausea/vomiting with coffee-ground emesis has resolved.  No other complaints or concerns at this time.  Denies headache, no fever/chills/night sweats, no nausea/vomiting/diarrhea, no chest pain, no palpitations, no shortness of breath, no cough/congestion, no abdominal pain, no weakness, no fatigue, no paresthesias.  No acute events overnight per nursing staff.  Objective: Vitals:   07/22/20 0530 07/22/20 0617 07/22/20 1022 07/22/20 1427  BP: (!) 158/79 (!) 160/85 (!) 150/95 (!) 181/85  Pulse: 69 69 74 67  Resp: 14 18 16 18   Temp:  98.6 F (37 C) 98.7 F (37.1 C)   TempSrc:  Oral Oral  SpO2: 100% 100% 100% 97%  Weight:      Height:        Intake/Output Summary (Last 24 hours) at 07/22/2020 1721 Last data filed at 07/22/2020 1509 Gross per 24 hour  Intake 1871.51 ml  Output --  Net  1871.51 ml   Filed Weights   07/21/20 1039  Weight: 44.5 kg    Examination:  General exam: Appears calm and comfortable  Respiratory system: Clear to auscultation. Respiratory effort normal.  On room air Cardiovascular system: S1 & S2 heard, RRR. No JVD, murmurs, rubs, gallops or clicks. No pedal edema. Gastrointestinal system: Abdomen is nondistended, soft and nontender. No organomegaly or masses felt. Normal bowel sounds heard.  G-tube noted Central nervous system: Alert and oriented. No focal neurological deficits. Extremities: Symmetric 5 x 5 power. Skin: No rashes, lesions or ulcers Psychiatry: Judgement and insight appear normal. Mood & affect appropriate.     Data Reviewed: I have personally reviewed following labs and imaging studies  CBC: Recent Labs  Lab 07/21/20 1109 07/22/20 0149 07/22/20 0540  WBC 7.8  --  4.7  NEUTROABS  --   --  3.6  HGB 18.0* 14.5 15.5*  HCT 48.7* 39.2 42.7  MCV 85.9  --  87.5  PLT 252  --  277   Basic Metabolic Panel: Recent Labs  Lab 07/21/20 1109  NA 127*  K 4.3  CL 90*  CO2 24  GLUCOSE 124*  BUN 9  CREATININE 0.87  CALCIUM 9.1   GFR: Estimated Creatinine Clearance: 43.5 mL/min (by C-G formula based on SCr of 0.87 mg/dL). Liver Function Tests: Recent Labs  Lab 07/21/20 1109  AST 30  ALT 14  ALKPHOS 103  BILITOT 1.7*  PROT 8.2*  ALBUMIN 3.9   Recent Labs  Lab 07/21/20 1109  LIPASE 38   No results for input(s): AMMONIA in the last 168 hours. Coagulation Profile: No results for input(s): INR, PROTIME in the last 168 hours. Cardiac Enzymes: No results for input(s): CKTOTAL, CKMB, CKMBINDEX, TROPONINI in the last 168 hours. BNP (last 3 results) No results for input(s): PROBNP in the last 8760 hours. HbA1C: No results for input(s): HGBA1C in the last 72 hours. CBG: Recent Labs  Lab 07/22/20 1626  GLUCAP 149*   Lipid Profile: Recent Labs    07/21/20 1650  TRIG 44   Thyroid Function Tests: No results  for input(s): TSH, T4TOTAL, FREET4, T3FREE, THYROIDAB in the last 72 hours. Anemia Panel: Recent Labs    07/21/20 1650 07/22/20 0149  FERRITIN 77 71   Sepsis Labs: Recent Labs  Lab 07/21/20 1613 07/21/20 1650 07/21/20 1813  PROCALCITON  --  <0.10  --   LATICACIDVEN 1.1  --  1.3    Recent Results (from the past 240 hour(s))  Resp Panel by RT-PCR (Flu A&B, Covid) Nasopharyngeal Swab     Status: Abnormal   Collection Time: 07/21/20 11:10 AM   Specimen: Nasopharyngeal Swab; Nasopharyngeal(NP) swabs in vial transport medium  Result Value Ref Range Status   SARS Coronavirus 2 by RT PCR POSITIVE (A) NEGATIVE Final    Comment: RESULT CALLED TO, READ BACK BY AND VERIFIED WITH: CONRAD,Q. RN AT 8242 07/21/20 MULLINS,T (NOTE) SARS-CoV-2 target nucleic acids are DETECTED.  The SARS-CoV-2 RNA is generally detectable in upper respiratory specimens during the acute phase of infection. Positive results are indicative of the presence of the identified virus, but do not rule out bacterial infection or co-infection with other pathogens not detected by the test. Clinical  correlation with patient history and other diagnostic information is necessary to determine patient infection status. The expected result is Negative.  Fact Sheet for Patients: EntrepreneurPulse.com.au  Fact Sheet for Healthcare Providers: IncredibleEmployment.be  This test is not yet approved or cleared by the [place] FDA and  has been authorized for detection and/or diagnosis of SARS-CoV-2 by FDA under an Emergency Use Authorization (EUA).  This EUA will remain in effect (meaning this test  can be used) for the duration of  the COVID-19 declaration under Section 564(b)(1) of the Act, 21 U.S.C. section 360bbb-3(b)(1), unless the authorization is terminated or revoked sooner.     Influenza A by PCR NEGATIVE NEGATIVE Final   Influenza B by PCR NEGATIVE NEGATIVE Final     Comment: (NOTE) The Xpert Xpress SARS-CoV-2/FLU/RSV plus assay is intended as an aid in the diagnosis of influenza from Nasopharyngeal swab specimens and should not be used as a sole basis for treatment. Nasal washings and aspirates are unacceptable for Xpert Xpress SARS-CoV-2/FLU/RSV testing.  Fact Sheet for Patients: EntrepreneurPulse.com.au  Fact Sheet for Healthcare Providers: IncredibleEmployment.be  This test is not yet approved or cleared by the [place] FDA and has been authorized for detection and/or diagnosis of SARS-CoV-2 by FDA under an Emergency Use Authorization (EUA). This EUA will remain in effect (meaning this test can be used) for the duration of the COVID-19 declaration under Section 564(b)(1) of the Act, 21 U.S.C. section 360bbb-3(b)(1), unless the authorization is terminated or revoked.  Performed at [facility], [place] [address] [place], [place] [address]   Blood Culture (routine x 2)     Status: None (Preliminary result)   Collection Time: 07/21/20  4:50 PM   Specimen: BLOOD LEFT FOREARM  Result Value Ref Range Status   Specimen Description   Final    BLOOD LEFT FOREARM Performed at [facility] [address] [place], [place] [address]    Special Requests   Final    BOTTLES DRAWN AEROBIC AND ANAEROBIC Blood Culture adequate volume Performed at [facility] [address], [place], [place] [address]    Culture   Final    NO GROWTH < 12 HOURS Performed at [facility] [address], [place], [place] [address]    Report Status PENDING  Incomplete         Radiology Studies: CT ABDOMEN PELVIS W CONTRAST  Result Date: 07/21/2020 CLINICAL DATA:  Abdominal pain EXAM: CT ABDOMEN AND PELVIS WITH CONTRAST TECHNIQUE: Multidetector CT imaging of the abdomen and pelvis was performed using the standard protocol following bolus administration of  intravenous contrast. CONTRAST:  4mL OMNIPAQUE IOHEXOL 300 MG/ML  SOLN COMPARISON:  PET-CT April 08, 2020 FINDINGS: Lower chest: Lung bases are clear.  There is a small hiatal hernia. Hepatobiliary: No focal liver lesions are appreciable. Gallbladder appears borderline distended without appreciable gallbladder wall thickening. There is no appreciable biliary duct dilatation. Pancreas: There is no pancreatic mass or inflammatory focus. Spleen: No splenic lesions are evident. Adrenals/Urinary Tract: Adrenals bilaterally appear unremarkable. There is an extrarenal pelvis on each side, an anatomic variant. There is no renal mass or hydronephrosis on either side. There is a junctional parenchymal defect on the left, an anatomic variant. There is no evident renal or ureteral calculus on either side. Urinary bladder is midline with wall thickness within normal limits. Urinary bladder is distended. Stomach/Bowel: The rectum and distal sigmoid colon are distended with stool. There is perirectal wall thickening but  no soft tissue stranding or fluid in the perirectal region. There is a degree of relatively mild rectal prolapse. Elsewhere, there is no appreciable bowel wall or mesenteric thickening. There is a gastrostomy catheter positioned in the stomach. There is fold thickening in the gastric antral region without ulceration seen by CT. There is no demonstrable bowel obstruction. The terminal ileum appears normal. There is no evident appendiceal region inflammation. No evident free air or portal venous air. Vascular/Lymphatic: No abdominal aortic aneurysm. There is aortic and iliac artery atherosclerosis. Major venous structures appear patent. No evident adenopathy in the abdomen or pelvis. Reproductive: Uterus absent.  No adnexal masses are evident. Other: No abscess or ascites is appreciable in the abdomen or pelvis. Musculoskeletal: No blastic or lytic bone lesions. No evident abdominal wall or intramuscular lesions.  IMPRESSION: 1. Diffuse distension of the distal sigmoid colon and rectum with stool. No perirectal wall thickening without soft tissue stranding or abscess. No perirectal fluid. There may be a degree of stercoral colitis/proctitis. There is a degree of rectal prolapse. 2. Urinary bladder somewhat distended without urinary bladder wall thickening. 3. Gastrostomy catheter positioned in the stomach. Wall thickening in the distal stomach felt to represent a degree of antral gastritis. 4. No evident small bowel obstruction. No abscess in the abdomen or pelvis. 5.  No evident renal or ureteral calculus.  No hydronephrosis. 6.  Small hiatal hernia. 7.  Apparent absence of the uterus. 8.  Aortic Atherosclerosis (ICD10-I70.0). 9. Gallbladder borderline prominent in size without wall thickening. No biliary duct dilatation appreciable by CT. Electronically Signed   By: Lowella Grip III M.D.   On: 07/21/2020 14:06   Portable chest 1 View  Result Date: 07/22/2020 CLINICAL DATA:  70 year old female with shortness of breath. Positive COVID-19. History of head and neck squamous cell carcinoma. EXAM: PORTABLE CHEST 1 VIEW COMPARISON:  CT Abdomen and Pelvis and portable chest 07/21/2020. FINDINGS: Portable AP semi upright view at 0540 hours. Stable right chest power port. Lung volumes and mediastinal contours are stable and normal aside from mild hyperinflation. Allowing for portable technique the lungs are clear. No pneumothorax or pleural effusion. No acute osseous abnormality identified. IMPRESSION: Pulmonary hyperinflation.  No acute cardiopulmonary abnormality. Electronically Signed   By: Genevie Ann M.D.   On: 07/22/2020 06:05   DG Chest Port 1 View  Result Date: 07/21/2020 CLINICAL DATA:  Abdominal pain EXAM: PORTABLE CHEST 1 VIEW COMPARISON:  None. FINDINGS: Right Port-A-Cath in place with the tip in the SVC. Heart and mediastinal contours are within normal limits. No focal opacities or effusions. No acute bony  abnormality. IMPRESSION: No active disease. Electronically Signed   By: Rolm Baptise M.D.   On: 07/21/2020 12:48        Scheduled Meds: . vitamin C  500 mg Oral Daily  . Chlorhexidine Gluconate Cloth  6 each Topical Daily  . feeding supplement  1 Container Oral BID BM  . feeding supplement (OSMOLITE 1.5 CAL)  237 mL Per Tube BID  . feeding supplement (PROSource TF)  45 mL Per Tube BID  . free water  100 mL Per Tube BID  . guaiFENesin  600 mg Oral BID  . Ipratropium-Albuterol  1 puff Inhalation Q6H  . multivitamin with minerals  1 tablet Oral Daily  . pantoprazole (PROTONIX) IV  40 mg Intravenous Q12H  . polyethylene glycol  17 g Oral BID  . senna-docusate  1 tablet Oral BID  . zinc sulfate  220 mg Oral Daily  Continuous Infusions: . sodium chloride 75 mL/hr at 07/22/20 0627  . remdesivir 100 mg in NS 100 mL 100 mg (07/22/20 0906)     LOS: 0 days    Time spent: 41 minutes spent on chart review, discussion with nursing staff, consultants, updating family and interview/physical exam; more than 50% of that time was spent in counseling and/or coordination of care.    Yuvaan Olander J [place] DO [facility] Available via Epic secure chat 7am-7pm After these hours, please refer to coverage provider listed on [email or web address] 07/22/2020, 5:21 PM

## 2020-07-22 NOTE — Progress Notes (Addendum)
Initial Nutrition Assessment  DOCUMENTATION CODES:   Underweight  INTERVENTION:  - will order Boost Breeze BID, each supplement provides 250 kcal and 9 grams of protein. - will order 1 carton (237 ml) Osmolite 1.5 BID with 45 ml Prosource TF BID and 100 ml free water/bolus.  - this regimen will provide 790 kcal, 52 grams protein, and 562 ml free water.    NUTRITION DIAGNOSIS:   Increased nutrient needs related to cancer and cancer related treatments,catabolic illness,acute illness (COVID-19 infection) as evidenced by estimated needs.   GOAL:   Patient will meet greater than or equal to 90% of their needs  MONITOR:   PO intake,Labs,Weight trends,Other (Comment) (ability to resume TF)  REASON FOR ASSESSMENT:   Malnutrition Screening Tool,Consult  ASSESSMENT:   70 y.o. female with medical history of hypopharyngeal cancer s/p PEG. She presented to Radiation Oncology due to PEG dislodging. IR consulted and replaced PEG. She returned home and then presented back to the [place] reporting PEG issues and feeling poorly with uncontrollable shaking, intermittent vomiting, abdominal pain, and inability to keep anything down; advised to go to the ED. In the ED, she was incidentally found to be COVID-19 positive.  Diet advanced from NPO to CLD yesterday at 2320; she consumed 15% of breakfast this AM and then diet advanced to Soft at 1126.  Patient is being followed on a monthly basis by [facility] RD. Last assessment was done via phone on 3/2 at which time patient was consuming soft foods, 3 meals/day, and administering 1 carton (237 ml) Osmolite 1.5 BID (once in the AM and once in the PM). This TF regimen was providing 710 kcal, 30 grams protein, and 362 ml free water.   She reported not liking the taste of Ensure or Boost supplements and was not open to drinking them d/t this.   Weight yesterday was 98 lb and has been stable since 04/08/20. Weight on 01/31/20 was 104  lb. This indicates 6 lb weight loss (6% body weight) in th past 6 months; not significant for time frame.   Per notes: - intractable N/V with coffee-ground emesis and abdominal pain--concern for gastritis - constipation - incidentally found to be COVID-19 positive   Labs reviewed; Na: 127 mmol/l, Cl: 90 mmol/l. Medications reviewed; 500 mg ascorbic acid/day, 40 mg IV protonix BID, 17 g miralax BID, 200 mg IV remdesivir x1 dose 4/12, 100 mg IV remdesivir x2 doses 4/13, 220 mg zinc sulfate/day.  IVF; NS @ 75 ml/hr.     NUTRITION - FOCUSED PHYSICAL EXAM:  unable to complete at this time.   Diet Order:   Diet Order            DIET SOFT Room service appropriate? Yes; Fluid consistency: Thin  Diet effective now                 EDUCATION NEEDS:   Not appropriate for education at this time  Skin:  Skin Assessment: Reviewed RN Assessment  Last BM:  4/12  Height:   Ht Readings from Last 1 Encounters:  07/21/20 5\' 5"  (1.651 m)    Weight:   Wt Readings from Last 1 Encounters:  07/21/20 44.5 kg     Estimated Nutritional Needs:  Kcal:  1780-2005 kcal Protein:  95-110 grams Fluid:  >/= 2 L/day      Jarome Matin, MS, RD, LDN, CNSC Inpatient Clinical Dietitian RD pager # available in AMION  After hours/weekend pager # available in [facility]

## 2020-07-22 NOTE — ED Notes (Signed)
ED TO INPATIENT HANDOFF REPORT  Name/Age/Gender Tina Kennedy 70 y.o. female  Code Status    Code Status Orders  (From admission, onward)         Start     Ordered   07/21/20 2320  Full code  Continuous        07/21/20 2319        Code Status History    This patient has a current code status but no historical code status.   Advance Care Planning Activity      Home/SNF/Other Home   Chief Complaint Intractable nausea and vomiting [R11.2]  Level of Care/Admitting Diagnosis ED Disposition    ED Disposition Condition Comment   Admit  Hospital Area: [facility] [[id number]]  Level of Care: Telemetry [5]  Admit to tele based on following criteria: Monitor QTC interval  Covid Evaluation: Confirmed COVID Positive  Diagnosis: Intractable nausea and vomiting [270350]  Admitting Physician: [place], [place] [[id number]]  Attending Physician: Raiford Noble LATIF [[id number]]       Medical History Past Medical History:  Diagnosis Date  . 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 RUQ 07/20/20  1121  RUQ  2   Incision (Closed) 10/15/19 N/A 10/15/19  0754  -- 281   Incision (Closed) 12/04/19 Abdomen 12/04/19  0810  -- 231          Labs/Imaging Results for orders placed or performed during the hospital encounter of 07/21/20 (from the past 48 hour(s))  CBC     Status: Abnormal   Collection Time: 07/21/20 11:09 AM  Result Value Ref Range   WBC 7.8 4.0 - 10.5 K/uL   RBC 5.67 (H) 3.87 - 5.11 MIL/uL   Hemoglobin 18.0 (H) 12.0 - 15.0  g/dL   HCT 48.7 (H) 36.0 - 46.0 %   MCV 85.9 80.0 - 100.0 fL   MCH 31.7 26.0 - 34.0 pg   MCHC 37.0 (H) 30.0 - 36.0 g/dL   RDW 11.9 11.5 - 15.5 %   Platelets 252 150 - 400 K/uL   nRBC 0.0 0.0 - 0.2 %    Comment: Performed at [facility], [employer] [employer] [address], [place], [place] [address]  Comprehensive metabolic panel     Status: Abnormal   Collection Time: 07/21/20 11:09 AM  Result Value Ref Range   Sodium 127 (L) 135 - 145 mmol/L   Potassium 4.3 3.5 - 5.1 mmol/L   Chloride 90 (L) 98 - 111 mmol/L   CO2 24 22 - 32 mmol/L   Glucose, Bld 124 (H) 70 - 99 mg/dL    Comment: Glucose reference range applies only to samples taken after fasting for at least 8 hours.   BUN 9 8 - 23 mg/dL   Creatinine, Ser 0.87 0.44 - 1.00 mg/dL   Calcium 9.1 8.9 - 10.3 mg/dL   Total Protein 8.2 (H) 6.5 - 8.1 g/dL   Albumin 3.9 3.5 - 5.0 g/dL   AST 30 15 - 41 U/L   ALT 14 0 - 44 U/L   Alkaline Phosphatase  103 38 - 126 U/L   Total Bilirubin 1.7 (H) 0.3 - 1.2 mg/dL   GFR, Estimated >60 >60 mL/min    Comment: (NOTE) Calculated using the CKD-EPI Creatinine Equation (2021)    Anion gap 13 5 - 15    Comment: Performed at [facility], [place] [address], [place], [place] [address]  Lipase, blood     Status: None   Collection Time: 07/21/20 11:09 AM  Result Value Ref Range   Lipase 38 11 - 51 U/L    Comment: Performed at [facility], [place] [address] [place], [place] [address]  Resp Panel by RT-PCR (Flu A&B, Covid) Nasopharyngeal Swab     Status: Abnormal   Collection Time: 07/21/20 11:10 AM   Specimen: Nasopharyngeal Swab; Nasopharyngeal(NP) swabs in vial transport medium  Result Value Ref Range   SARS Coronavirus 2 by RT PCR POSITIVE (A) NEGATIVE    Comment: RESULT CALLED TO, READ BACK BY AND VERIFIED WITH: CONRAD,Q. RN AT 9741 07/21/20 MULLINS,T (NOTE) SARS-CoV-2 target nucleic acids are DETECTED.  The SARS-CoV-2 RNA is generally detectable in upper  respiratory specimens during the acute phase of infection. Positive results are indicative of the presence of the identified virus, but do not rule out bacterial infection or co-infection with other pathogens not detected by the test. Clinical correlation with patient history and other diagnostic information is necessary to determine patient infection status. The expected result is Negative.  Fact Sheet for Patients: EntrepreneurPulse.com.au  Fact Sheet for Healthcare Providers: IncredibleEmployment.be  This test is not yet approved or cleared by the [place] FDA and  has been authorized for detection and/or diagnosis of SARS-CoV-2 by FDA under an Emergency Use Authorization (EUA).  This EUA will remain in effect (meaning this test  can be used) for the duration of  the COVID-19 declaration under Section 564(b)(1) of the Act, 21 U.S.C. section 360bbb-3(b)(1), unless the authorization is terminated or revoked sooner.     Influenza A by PCR NEGATIVE NEGATIVE   Influenza B by PCR NEGATIVE NEGATIVE    Comment: (NOTE) The Xpert Xpress SARS-CoV-2/FLU/RSV plus assay is intended as an aid in the diagnosis of influenza from Nasopharyngeal swab specimens and should not be used as a sole basis for treatment. Nasal washings and aspirates are unacceptable for Xpert Xpress SARS-CoV-2/FLU/RSV testing.  Fact Sheet for Patients: EntrepreneurPulse.com.au  Fact Sheet for Healthcare Providers: IncredibleEmployment.be  This test is not yet approved or cleared by the [place] FDA and has been authorized for detection and/or diagnosis of SARS-CoV-2 by FDA under an Emergency Use Authorization (EUA). This EUA will remain in effect (meaning this test can be used) for the duration of the COVID-19 declaration under Section 564(b)(1) of the Act, 21 U.S.C. section 360bbb-3(b)(1), unless the authorization is terminated  or revoked.  Performed at [facility], [place] [address], [place], [place] [address]   Lactic acid, plasma     Status: None   Collection Time: 07/21/20  4:13 PM  Result Value Ref Range   Lactic Acid, Venous 1.1 0.5 - 1.9 mmol/L    Comment: Performed at [facility], [place] [address] [place], [place] [address]  D-dimer, quantitative     Status: Abnormal   Collection Time: 07/21/20  4:50 PM  Result Value Ref Range   D-Dimer, Quant 0.85 (H) 0.00 - 0.50 ug/mL-FEU    Comment: (NOTE) At the manufacturer cut-off value of 0.5 g/mL FEU, this assay has a negative predictive value  of 95-100%.This assay is intended for use in conjunction with a clinical pretest probability (PTP) assessment model to exclude pulmonary embolism (PE) and deep venous thrombosis (DVT) in outpatients suspected of PE or DVT. Results should be correlated with clinical presentation. Performed at [facility], [place] [address] [place],  [address]   Procalcitonin     Status: None   Collection Time: 07/21/20  4:50 PM  Result Value Ref Range   Procalcitonin <0.10 ng/mL    Comment:        Interpretation: PCT (Procalcitonin) <= 0.5 ng/mL: Systemic infection (sepsis) is not likely. Local bacterial infection is possible. (NOTE)       Sepsis PCT Algorithm           Lower Respiratory Tract                                      Infection PCT Algorithm    ----------------------------     ----------------------------         PCT < 0.25 ng/mL                PCT < 0.10 ng/mL          Strongly encourage             Strongly discourage   discontinuation of antibiotics    initiation of antibiotics    ----------------------------     -----------------------------       PCT 0.25 - 0.50 ng/mL            PCT 0.10 - 0.25 ng/mL               OR       >80% decrease in PCT            Discourage initiation of                                            antibiotics       Encourage discontinuation           of antibiotics    ----------------------------     -----------------------------         PCT >= 0.50 ng/mL              PCT 0.26 - 0.50 ng/mL               AND        <80% decrease in PCT             Encourage initiation of                                             antibiotics       Encourage continuation           of antibiotics    ----------------------------     -----------------------------        PCT >= 0.50 ng/mL                  PCT > 0.50 ng/mL               AND         increase in PCT  Strongly encourage                                      initiation of antibiotics    Strongly encourage escalation           of antibiotics                                     -----------------------------                                           PCT <= 0.25 ng/mL                                                 OR                                        > 80% decrease in PCT                                      Discontinue / Do not initiate                                             antibiotics  Performed at [place] [address], [place], [place] [address]   Lactate dehydrogenase     Status: None   Collection Time: 07/21/20  4:50 PM  Result Value Ref Range   LDH 133 98 - 192 U/L    Comment: Performed at [facility], [place] [address] [place], [place] [address]  Ferritin     Status: None   Collection Time: 07/21/20  4:50 PM  Result Value Ref Range   Ferritin 77 11 - 307 ng/mL    Comment: Performed at [facility], [place] [address], [place], [place] [address]  Triglycerides     Status: None   Collection Time: 07/21/20  4:50 PM  Result Value Ref Range   Triglycerides 44 <150 mg/dL    Comment: Performed at [facility], [place] [address], [place], [place] [address]  Fibrinogen     Status: None   Collection Time: 07/21/20  4:50 PM  Result Value Ref Range    Fibrinogen 301 210 - 475 mg/dL    Comment: Performed at [facility], [place] [address], [place], [place] [address]  C-reactive protein     Status: None   Collection Time: 07/21/20  4:50 PM  Result Value Ref Range   CRP 0.5 <1.0 mg/dL    Comment: Performed at [facility], [place] [address], [place], [place] [address]  Lactic acid, plasma     Status: None   Collection Time: 07/21/20  6:13 PM  Result Value Ref Range   Lactic Acid, Venous 1.3 0.5 - 1.9 mmol/L  Comment: Performed at [facility], [place] [address] [place], [place] [address]  Hemoglobin and hematocrit, blood     Status: None   Collection Time: 07/22/20  1:49 AM  Result Value Ref Range   Hemoglobin 14.5 12.0 - 15.0 g/dL   HCT 39.2 36.0 - 46.0 %    Comment: Performed at [facility], [place] [address] [place], [place] [address]  C-reactive protein     Status: None   Collection Time: 07/22/20  1:49 AM  Result Value Ref Range   CRP <0.5 <1.0 mg/dL    Comment: Performed at [facility], [place] [address], [place], [place] [address]  Ferritin     Status: None   Collection Time: 07/22/20  1:49 AM  Result Value Ref Range   Ferritin 71 11 - 307 ng/mL    Comment: Performed at [facility], [place] [address] [place], [place] [address]  Troponin I (High Sensitivity)     Status: None   Collection Time: 07/22/20  1:49 AM  Result Value Ref Range   Troponin I (High Sensitivity) 9 <18 ng/L    Comment: (NOTE) Elevated high sensitivity troponin I (hsTnI) values and significant  changes across serial measurements may suggest ACS but many other  chronic and acute conditions are known to elevate hsTnI results.  Refer to the "Links" section for chest pain algorithms and additional  guidance. Performed at [facility], [place] [address], [place], [place] [address]    CT ABDOMEN PELVIS W CONTRAST  Result Date:  07/21/2020 CLINICAL DATA:  Abdominal pain EXAM: CT ABDOMEN AND PELVIS WITH CONTRAST TECHNIQUE: Multidetector CT imaging of the abdomen and pelvis was performed using the standard protocol following bolus administration of intravenous contrast. CONTRAST:  48mL OMNIPAQUE IOHEXOL 300 MG/ML  SOLN COMPARISON:  PET-CT April 08, 2020 FINDINGS: Lower chest: Lung bases are clear.  There is a small hiatal hernia. Hepatobiliary: No focal liver lesions are appreciable. Gallbladder appears borderline distended without appreciable gallbladder wall thickening. There is no appreciable biliary duct dilatation. Pancreas: There is no pancreatic mass or inflammatory focus. Spleen: No splenic lesions are evident. Adrenals/Urinary Tract: Adrenals bilaterally appear unremarkable. There is an extrarenal pelvis on each side, an anatomic variant. There is no renal mass or hydronephrosis on either side. There is a junctional parenchymal defect on the left, an anatomic variant. There is no evident renal or ureteral calculus on either side. Urinary bladder is midline with wall thickness within normal limits. Urinary bladder is distended. Stomach/Bowel: The rectum and distal sigmoid colon are distended with stool. There is perirectal wall thickening but no soft tissue stranding or fluid in the perirectal region. There is a degree of relatively mild rectal prolapse. Elsewhere, there is no appreciable bowel wall or mesenteric thickening. There is a gastrostomy catheter positioned in the stomach. There is fold thickening in the gastric antral region without ulceration seen by CT. There is no demonstrable bowel obstruction. The terminal ileum appears normal. There is no evident appendiceal region inflammation. No evident free air or portal venous air. Vascular/Lymphatic: No abdominal aortic aneurysm. There is aortic and iliac artery atherosclerosis. Major venous structures appear patent. No evident adenopathy in the abdomen or pelvis.  Reproductive: Uterus absent.  No adnexal masses are evident. Other: No abscess or ascites is appreciable in the abdomen or pelvis. Musculoskeletal: No blastic or lytic bone lesions. No evident abdominal wall or intramuscular lesions. IMPRESSION: 1. Diffuse distension of the distal sigmoid colon  and rectum with stool. No perirectal wall thickening without soft tissue stranding or abscess. No perirectal fluid. There may be a degree of stercoral colitis/proctitis. There is a degree of rectal prolapse. 2. Urinary bladder somewhat distended without urinary bladder wall thickening. 3. Gastrostomy catheter positioned in the stomach. Wall thickening in the distal stomach felt to represent a degree of antral gastritis. 4. No evident small bowel obstruction. No abscess in the abdomen or pelvis. 5.  No evident renal or ureteral calculus.  No hydronephrosis. 6.  Small hiatal hernia. 7.  Apparent absence of the uterus. 8.  Aortic Atherosclerosis (ICD10-I70.0). 9. Gallbladder borderline prominent in size without wall thickening. No biliary duct dilatation appreciable by CT. Electronically Signed   By: Lowella Grip III M.D.   On: 07/21/2020 14:06   IR Replc Gastro/Colonic Tube Percut W/Fluoro  Result Date: 07/20/2020 INDICATION: 70 year old with history of hypopharyngeal cancer and history of laparoscopically placed gastrostomy tube. Gastrostomy tube was recently dislodged and patient needs tube replacement. EXAM: REPLACEMENT OF GASTROSTOMY TUBE WITH FLUOROSCOPY MEDICATIONS: Viscous lidocaine ANESTHESIA/SEDATION: Viscous lidocaine CONTRAST:  10 mL Omnipaque 300-administered into the gastric lumen. FLUOROSCOPY TIME:  Fluoroscopy Time: 1 minutes, 30 seconds, 6 mGy COMPLICATIONS: None immediate. PROCEDURE: Informed written consent was obtained from the patient after a thorough discussion of the procedural risks, benefits and alternatives. All questions were addressed. A timeout was performed prior to the initiation of the  procedure. Initially the interventional radiology technologist tried to advance a new tube through the existing hole but this was unsuccessful. Therefore, the anterior abdomen and the gastrostomy site were prepped and draped in sterile fashion. Maximal barrier sterile technique was utilized including caps, mask, sterile gowns, sterile gloves, sterile drape, hand hygiene and skin antiseptic. Five French catheter was advanced into the stomach using fluoroscopic guidance and contrast injection confirmed placement in the stomach. Super stiff Amplatz wire was placed in the stomach. Attempted to dilate the tract to 77 [place] but this was very uncomfortable for the patient. Therefore, the tract was only dilated to 99 [place]. After dilatation, 16 French Entuit catheter was easily advanced over the wire. Balloon was inflated with 6 mL of saline. Contrast injection confirmed placement in the stomach. Catheter was flushed with saline. Fluoroscopic images were taken and saved for this procedure. IMPRESSION: Successful replacement of the percutaneous gastrostomy tube. Patient currently has a 65 French balloon retention gastrostomy tube. Electronically Signed   By: Markus Daft M.D.   On: 07/20/2020 12:58   DG Chest Port 1 View  Result Date: 07/21/2020 CLINICAL DATA:  Abdominal pain EXAM: PORTABLE CHEST 1 VIEW COMPARISON:  None. FINDINGS: Right Port-A-Cath in place with the tip in the SVC. Heart and mediastinal contours are within normal limits. No focal opacities or effusions. No acute bony abnormality. IMPRESSION: No active disease. Electronically Signed   By: Rolm Baptise M.D.   On: 07/21/2020 12:48    Pending Labs Unresulted Labs (From admission, onward)          Start     Ordered   07/22/20 0500  CBC  Tomorrow morning,   R        07/21/20 2319   07/22/20 0500  CBC with Differential/Platelet  Daily,   R      07/21/20 2319   07/22/20 0500  C-reactive protein  Daily,   R      07/21/20 2319   07/22/20 0500   D-dimer, quantitative  Daily,   R      07/21/20 2319  07/22/20 0500  Ferritin  Daily,   R      07/21/20 2319   07/22/20 0500  Magnesium  Daily,   R      07/21/20 2319   07/22/20 0500  Phosphorus  Daily,   R      07/21/20 2319   07/22/20 0500  Comprehensive metabolic panel  Tomorrow morning,   R        07/21/20 1928   07/22/20 0500  CBC with Differential/Platelet  Tomorrow morning,   R        07/21/20 1928   07/22/20 0500  Phosphorus  Tomorrow morning,   R        07/21/20 1928   07/22/20 0500  Magnesium  Tomorrow morning,   R        07/21/20 1928   07/21/20 2320  HIV Antibody (routine testing w rflx)  (HIV Antibody (Routine testing w reflex) panel)  Once,   STAT        07/21/20 2319   07/21/20 2320  TSH  Add-on,   AD        07/21/20 2319   07/21/20 1915  Hemoglobin and hematocrit, blood  Now then every 6 hours,   R (with STAT occurrences)      07/21/20 1915   07/21/20 1613  Blood Culture (routine x 2)  BLOOD CULTURE X 2,   STAT      07/21/20 1612          Vitals/Pain Today's Vitals   07/21/20 1911 07/21/20 2145 07/22/20 0030 07/22/20 0330  BP:  (!) 145/90 108/86 (!) 146/70  Pulse:  74 88 70  Resp:  13 14 16   Temp:      TempSrc:      SpO2:  99% 100% 98%  Weight:      Height:      PainSc: 3        Isolation Precautions Airborne and Contact precautions  Medications Medications  remdesivir 200 mg in sodium chloride 0.9% 250 mL IVPB (0 mg Intravenous Stopped 07/21/20 1825)    Followed by  remdesivir 100 mg in sodium chloride 0.9 % 100 mL IVPB (has no administration in time range)  enoxaparin (LOVENOX) injection 30 mg (has no administration in time range)  acetaminophen (TYLENOL) tablet 650 mg (has no administration in time range)    Or  acetaminophen (TYLENOL) suppository 650 mg (has no administration in time range)  ondansetron (ZOFRAN) tablet 4 mg (has no administration in time range)    Or  ondansetron (ZOFRAN) injection 4 mg (has no administration in time range)   polyethylene glycol (MIRALAX / GLYCOLAX) packet 17 g (has no administration in time range)  bisacodyl (DULCOLAX) suppository 10 mg (has no administration in time range)  guaiFENesin (MUCINEX) 12 hr tablet 600 mg (600 mg Oral Given 07/21/20 2343)  Ipratropium-Albuterol (COMBIVENT) respimat 1 puff (1 puff Inhalation Given 07/22/20 0141)  guaiFENesin-dextromethorphan (ROBITUSSIN DM) 100-10 MG/5ML syrup 10 mL (has no administration in time range)  chlorpheniramine-HYDROcodone (TUSSIONEX) 10-8 MG/5ML suspension 5 mL (has no administration in time range)  ascorbic acid (VITAMIN C) tablet 500 mg (has no administration in time range)  zinc sulfate capsule 220 mg (has no administration in time range)  prochlorperazine (COMPAZINE) injection 10 mg (has no administration in time range)  fentaNYL (SUBLIMAZE) injection 12.5 mcg (12.5 mcg Intravenous Given 07/21/20 1826)  hydrALAZINE (APRESOLINE) injection 10 mg (has no administration in time range)  0.9 %  sodium chloride infusion ( Intravenous New  Bag/Given 07/21/20 1826)  polyethylene glycol (MIRALAX / GLYCOLAX) packet 17 g (17 g Oral Given 07/21/20 2343)  senna-docusate (Senokot-S) tablet 1 tablet (1 tablet Oral Given 07/21/20 2343)  pantoprazole (PROTONIX) injection 40 mg (40 mg Intravenous Given 07/21/20 2343)  sodium chloride 0.9 % bolus 1,000 mL (1,000 mLs Intravenous Bolus 07/21/20 1301)  ondansetron (ZOFRAN) injection 4 mg (4 mg Intravenous Given 07/21/20 1258)  fentaNYL (SUBLIMAZE) injection 25 mcg (25 mcg Intravenous Given 07/21/20 1257)  pantoprazole (PROTONIX) injection 40 mg (40 mg Intravenous Given 07/21/20 1254)  lidocaine (XYLOCAINE) 5 % ointment (1 application Topical Given 07/21/20 1346)  iohexol (OMNIPAQUE) 300 MG/ML solution 75 mL (75 mLs Intravenous Contrast Given 07/21/20 1327)  sodium chloride 0.9 % bolus 500 mL (500 mLs Intravenous Bolus 07/21/20 1742)    Mobility Normally ambulatory

## 2020-07-22 NOTE — Progress Notes (Signed)
IVT consulted for Porth-a-cath access.  Pt refused to have accessed.  States wants blood draws from current PIV.  Provided explanation regarding blood draws from current PIV's.  Pt states she does not want to be accessed and prefers to have phlebotomy draw blood.  RN, Velva Harman aware and advised if pt would like to be accessed, place consult and IVT will be happy to assist.

## 2020-07-22 NOTE — Consult Note (Signed)
Referring Provider: [facility] Primary Care Physician:  Patient, No Pcp Per (Inactive) Primary Gastroenterologist:  Althia Forts  Reason for Consultation:  Coffee ground emesis x 1  HPI: Tina Kennedy is a 70 y.o. 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  Patient reports she had her PEG tube replaced by IR on Monday 4/11.  She states later that evening, she had some abdominal pain and nausea and then had one episode of "brown liquid" emesis.  Today, she states her symptoms have resolved.  She denies any nausea or vomiting.  She denies any abdominal pain.  Patient denies any melena or hematochezia.  She is interested in trying a clear liquid tray.  Reports PEG tube is smaller than prior and it is difficult for her to administer tube feeds.  She denies any blood thinner, aspirin, or NSAID use.  Denies any family history of gastrointestinal malignancies or disorders.   Past Medical History:  Diagnosis Date  . Hypopharyngeal cancer [facility])     Past Surgical History:  Procedure Laterality Date  . DIRECT LARYNGOSCOPY N/A 10/15/2019   Procedure: DIRECT LARYNGOSCOPY w/BIOPSY;  Surgeon: Izora Gala, MD;  Location: [place] ;  Service: ENT;  Laterality: N/A;  . GASTROSTOMY N/A 12/04/2019   Procedure: LAPAROSCOPIC G TUBE PLACEMENT;  Surgeon: Ralene Ok, MD;  Location: [facility];  Service: General;  Laterality: N/A;  . IR GASTROSTOMY TUBE MOD SED  11/15/2019  . IR IMAGING GUIDED PORT INSERTION  11/15/2019  . IR REPLC GASTRO/COLONIC TUBE PERCUT W/FLUORO  07/20/2020  . RIGID ESOPHAGOSCOPY N/A 10/15/2019   Procedure: RIGID ESOPHAGOSCOPY;  Surgeon: Izora Gala, MD;  Location: [place];  Service: ENT;  Laterality: N/A;    Prior to Admission medications   Medication Sig Start Date End Date Taking? Authorizing Provider  HYDROcodone-acetaminophen (HYCET) 7.5-325 mg/15 ml solution Take 15 mLs by mouth  4 (four) times daily as needed for moderate pain. Patient not taking: No sig reported 01/10/20 01/09/21  Wyatt Portela, MD  lidocaine (XYLOCAINE) 2 % solution Patient: Mix 1part 2% viscous lidocaine, 1part H20. Swallow 18mL of diluted mixture, 52min before meals and at bedtime, up to QID Patient not taking: No sig reported 11/25/19   Eppie Gibson, MD  lidocaine-prilocaine (EMLA) cream Apply 1 application topically as needed. Patient not taking: No sig reported 10/31/19   Wyatt Portela, MD  Nutritional Supplements (FEEDING SUPPLEMENT, OSMOLITE 1.5 CAL,) LIQD Give 1 carton Osmolite 1.5 via G-tube with 60 mL free water before and after each feeding 5 times daily, 3 hours apart.  This provides greater than 90% estimated needs.  1775 cal, 74.5 g protein, 1505 mL free water.  Please send supplies and formula. Patient not taking: Reported on 07/21/2020 12/10/19   Eppie Gibson, MD  prochlorperazine (COMPAZINE) 10 MG tablet Take 1 tablet (10 mg total) by mouth every 6 (six) hours as needed for nausea or vomiting. Patient not taking: No sig reported 10/31/19   Wyatt Portela, MD    Scheduled Meds: . vitamin C  500 mg Oral Daily  . Chlorhexidine Gluconate Cloth  6 each Topical Daily  . enoxaparin (LOVENOX) injection  30 mg Subcutaneous Q24H  . guaiFENesin  600 mg Oral BID  . Ipratropium-Albuterol  1 puff Inhalation Q6H  . pantoprazole (PROTONIX) IV  40 mg Intravenous Q12H  . polyethylene glycol  17 g Oral BID  . senna-docusate  1 tablet Oral BID  .  zinc sulfate  220 mg Oral Daily   Continuous Infusions: . sodium chloride 75 mL/hr at 07/22/20 0627  . remdesivir 100 mg in NS 100 mL 100 mg (07/22/20 0906)   PRN Meds:.acetaminophen **OR** acetaminophen, bisacodyl, chlorpheniramine-HYDROcodone, fentaNYL (SUBLIMAZE) injection, guaiFENesin-dextromethorphan, hydrALAZINE, ondansetron **OR** ondansetron (ZOFRAN) IV, polyethylene glycol, prochlorperazine  Allergies as of 07/21/2020  . (No Known Allergies)     Family History  Problem Relation Age of Onset  . Hypertension Mother   . Hypertension Sister     Social History   Socioeconomic History  . Marital status: Divorced    Spouse name: Not on file  . Number of children: 1  . Years of education: 11  . Highest education level: Not on file  Occupational History  . Not on file  Tobacco Use  . Smoking status: Current Some Day Smoker    Packs/day: 0.25    Types: Cigarettes  . Smokeless tobacco: Never Used  . Tobacco comment: Currently 1-2 cigarettes daily  Vaping Use  . Vaping Use: Never used  Substance and Sexual Activity  . Alcohol use: Not Currently    Alcohol/week: 1.0 standard drink    Types: 1 Cans of beer per week    Comment: Occasional on weekend  . Drug use: Not Currently    Types: Marijuana    Comment: not used in 11/10/2019   . Sexual activity: Yes  Other Topics Concern  . Not on file  Social History Narrative  . Not on file   Social Determinants of Health   Financial Resource Strain: Not on file  Food Insecurity: Not on file  Transportation Needs: Unmet Transportation Needs  . Lack of Transportation (Medical): Yes  . Lack of Transportation (Non-Medical): Yes  Physical Activity: Not on file  Stress: Not on file  Social Connections: Not on file  Intimate Partner Violence: Not on file    Review of Systems: Review of Systems  Constitutional: Negative for fever and malaise/fatigue.  HENT: Negative for hearing loss and tinnitus.   Eyes: Negative for pain and redness.  Respiratory: Negative for cough and shortness of breath.   Cardiovascular: Negative for chest pain and palpitations.  Gastrointestinal: Positive for heartburn and nausea. Negative for abdominal pain, blood in stool, constipation, diarrhea, melena and vomiting.  Genitourinary: Negative for flank pain and hematuria.  Musculoskeletal: Negative for falls and joint pain.  Skin: Negative for itching and rash.  Neurological: Negative for seizures and  loss of consciousness.  Endo/Heme/Allergies: Negative for polydipsia. Does not bruise/bleed easily.  Psychiatric/Behavioral: Negative for substance abuse. The patient is not nervous/anxious.      Physical Exam: Vital signs: Vitals:   07/22/20 0617 07/22/20 1022  BP: (!) 160/85 (!) 150/95  Pulse: 69 74  Resp: 18 16  Temp: 98.6 F (37 C) 98.7 F (37.1 C)  SpO2: 100% 100%   Last BM Date: 07/21/20  Physical Exam Vitals reviewed.  Constitutional:      General: She is not in acute distress.    Appearance: She is underweight.  HENT:     Head: Normocephalic and atraumatic.  Eyes:     General: No scleral icterus.    Extraocular Movements: Extraocular movements intact.  Cardiovascular:     Rate and Rhythm: Normal rate and regular rhythm.     Heart sounds: Normal heart sounds.  Pulmonary:     Effort: Pulmonary effort is normal.     Breath sounds: Normal breath sounds.  Abdominal:     General: Bowel sounds are  normal. There is no distension.     Palpations: Abdomen is soft. There is no mass.     Tenderness: There is no abdominal tenderness. There is no guarding or rebound.     Hernia: No hernia is present.     Comments: PEG tube, flushed without any evidence of GI bleeding  Musculoskeletal:        General: No swelling or tenderness.     Cervical back: Normal range of motion and neck supple.  Skin:    General: Skin is warm and dry.  Neurological:     General: No focal deficit present.     Mental Status: She is alert and oriented to person, place, and time.  Psychiatric:        Mood and Affect: Mood normal.        Behavior: Behavior normal.     GI:  Lab Results: Recent Labs    07/21/20 1109 07/22/20 0149 07/22/20 0540  WBC 7.8  --  4.7  HGB 18.0* 14.5 15.5*  HCT 48.7* 39.2 42.7  PLT 252  --  223   BMET Recent Labs    07/21/20 1109  NA 127*  K 4.3  CL 90*  CO2 24  GLUCOSE 124*  BUN 9  CREATININE 0.87  CALCIUM 9.1   LFT Recent Labs    07/21/20 1109   PROT 8.2*  ALBUMIN 3.9  AST 30  ALT 14  ALKPHOS 103  BILITOT 1.7*   PT/INR No results for input(s): LABPROT, INR in the last 72 hours.   Studies/Results: CT ABDOMEN PELVIS W CONTRAST  Result Date: 07/21/2020 CLINICAL DATA:  Abdominal pain EXAM: CT ABDOMEN AND PELVIS WITH CONTRAST TECHNIQUE: Multidetector CT imaging of the abdomen and pelvis was performed using the standard protocol following bolus administration of intravenous contrast. CONTRAST:  47mL OMNIPAQUE IOHEXOL 300 MG/ML  SOLN COMPARISON:  PET-CT April 08, 2020 FINDINGS: Lower chest: Lung bases are clear.  There is a small hiatal hernia. Hepatobiliary: No focal liver lesions are appreciable. Gallbladder appears borderline distended without appreciable gallbladder wall thickening. There is no appreciable biliary duct dilatation. Pancreas: There is no pancreatic mass or inflammatory focus. Spleen: No splenic lesions are evident. Adrenals/Urinary Tract: Adrenals bilaterally appear unremarkable. There is an extrarenal pelvis on each side, an anatomic variant. There is no renal mass or hydronephrosis on either side. There is a junctional parenchymal defect on the left, an anatomic variant. There is no evident renal or ureteral calculus on either side. Urinary bladder is midline with wall thickness within normal limits. Urinary bladder is distended. Stomach/Bowel: The rectum and distal sigmoid colon are distended with stool. There is perirectal wall thickening but no soft tissue stranding or fluid in the perirectal region. There is a degree of relatively mild rectal prolapse. Elsewhere, there is no appreciable bowel wall or mesenteric thickening. There is a gastrostomy catheter positioned in the stomach. There is fold thickening in the gastric antral region without ulceration seen by CT. There is no demonstrable bowel obstruction. The terminal ileum appears normal. There is no evident appendiceal region inflammation. No evident free air or  portal venous air. Vascular/Lymphatic: No abdominal aortic aneurysm. There is aortic and iliac artery atherosclerosis. Major venous structures appear patent. No evident adenopathy in the abdomen or pelvis. Reproductive: Uterus absent.  No adnexal masses are evident. Other: No abscess or ascites is appreciable in the abdomen or pelvis. Musculoskeletal: No blastic or lytic bone lesions. No evident abdominal wall or intramuscular lesions. IMPRESSION: 1. Diffuse  distension of the distal sigmoid colon and rectum with stool. No perirectal wall thickening without soft tissue stranding or abscess. No perirectal fluid. There may be a degree of stercoral colitis/proctitis. There is a degree of rectal prolapse. 2. Urinary bladder somewhat distended without urinary bladder wall thickening. 3. Gastrostomy catheter positioned in the stomach. Wall thickening in the distal stomach felt to represent a degree of antral gastritis. 4. No evident small bowel obstruction. No abscess in the abdomen or pelvis. 5.  No evident renal or ureteral calculus.  No hydronephrosis. 6.  Small hiatal hernia. 7.  Apparent absence of the uterus. 8.  Aortic Atherosclerosis (ICD10-I70.0). 9. Gallbladder borderline prominent in size without wall thickening. No biliary duct dilatation appreciable by CT. Electronically Signed   By: Lowella Grip III M.D.   On: 07/21/2020 14:06   IR Replc Gastro/Colonic Tube Percut W/Fluoro  Result Date: 07/20/2020 INDICATION: 70 year old with history of hypopharyngeal cancer and history of laparoscopically placed gastrostomy tube. Gastrostomy tube was recently dislodged and patient needs tube replacement. EXAM: REPLACEMENT OF GASTROSTOMY TUBE WITH FLUOROSCOPY MEDICATIONS: Viscous lidocaine ANESTHESIA/SEDATION: Viscous lidocaine CONTRAST:  10 mL Omnipaque 300-administered into the gastric lumen. FLUOROSCOPY TIME:  Fluoroscopy Time: 1 minutes, 30 seconds, 6 mGy COMPLICATIONS: None immediate. PROCEDURE: Informed written  consent was obtained from the patient after a thorough discussion of the procedural risks, benefits and alternatives. All questions were addressed. A timeout was performed prior to the initiation of the procedure. Initially the interventional radiology technologist tried to advance a new tube through the existing hole but this was unsuccessful. Therefore, the anterior abdomen and the gastrostomy site were prepped and draped in sterile fashion. Maximal barrier sterile technique was utilized including caps, mask, sterile gowns, sterile gloves, sterile drape, hand hygiene and skin antiseptic. Five French catheter was advanced into the stomach using fluoroscopic guidance and contrast injection confirmed placement in the stomach. Super stiff Amplatz wire was placed in the stomach. Attempted to dilate the tract to 35 [place] but this was very uncomfortable for the patient. Therefore, the tract was only dilated to 14 [place]. After dilatation, 16 French Entuit catheter was easily advanced over the wire. Balloon was inflated with 6 mL of saline. Contrast injection confirmed placement in the stomach. Catheter was flushed with saline. Fluoroscopic images were taken and saved for this procedure. IMPRESSION: Successful replacement of the percutaneous gastrostomy tube. Patient currently has a 60 French balloon retention gastrostomy tube. Electronically Signed   By: Markus Daft M.D.   On: 07/20/2020 12:58   Portable chest 1 View  Result Date: 07/22/2020 CLINICAL DATA:  70 year old female with shortness of breath. Positive COVID-19. History of head and neck squamous cell carcinoma. EXAM: PORTABLE CHEST 1 VIEW COMPARISON:  CT Abdomen and Pelvis and portable chest 07/21/2020. FINDINGS: Portable AP semi upright view at 0540 hours. Stable right chest power port. Lung volumes and mediastinal contours are stable and normal aside from mild hyperinflation. Allowing for portable technique the lungs are clear. No pneumothorax or pleural  effusion. No acute osseous abnormality identified. IMPRESSION: Pulmonary hyperinflation.  No acute cardiopulmonary abnormality. Electronically Signed   By: Genevie Ann M.D.   On: 07/22/2020 06:05   DG Chest Port 1 View  Result Date: 07/21/2020 CLINICAL DATA:  Abdominal pain EXAM: PORTABLE CHEST 1 VIEW COMPARISON:  None. FINDINGS: Right Port-A-Cath in place with the tip in the SVC. Heart and mediastinal contours are within normal limits. No focal opacities or effusions. No acute bony abnormality. IMPRESSION: No active disease. Electronically Signed  By: Rolm Baptise M.D.   On: 07/21/2020 12:48    Impression: Coffee-ground emesis x1: Suspect some gastric irritation after replacement of PEG tube. -Hemoglobin 15.5, stable  Hypopharyngeal cancer  Plan: Continue Protonix IV twice daily.  At discharge, transition to PO Protonix and continue twice daily for 1 to 2 months.  Advance diet as tolerated.  IR consultation to discuss increasing size of PEG tube.  [facility] will follow.   LOS: 0 days   Salley Slaughter  PA-C 07/22/2020, 10:53 AM  Contact #  [phone]

## 2020-07-23 DIAGNOSIS — K9420 Gastrostomy complication, unspecified: Secondary | ICD-10-CM | POA: Diagnosis not present

## 2020-07-23 DIAGNOSIS — E878 Other disorders of electrolyte and fluid balance, not elsewhere classified: Secondary | ICD-10-CM | POA: Diagnosis not present

## 2020-07-23 DIAGNOSIS — U071 COVID-19: Secondary | ICD-10-CM | POA: Diagnosis not present

## 2020-07-23 DIAGNOSIS — R112 Nausea with vomiting, unspecified: Secondary | ICD-10-CM | POA: Diagnosis not present

## 2020-07-23 DIAGNOSIS — K920 Hematemesis: Secondary | ICD-10-CM | POA: Diagnosis not present

## 2020-07-23 DIAGNOSIS — E871 Hypo-osmolality and hyponatremia: Secondary | ICD-10-CM | POA: Diagnosis not present

## 2020-07-23 LAB — GLUCOSE, CAPILLARY
Glucose-Capillary: 95 mg/dL (ref 70–99)
Glucose-Capillary: 83 mg/dL (ref 70–99)
Glucose-Capillary: 172 mg/dL — ABNORMAL HIGH (ref 70–99)

## 2020-07-23 LAB — CBC WITH DIFFERENTIAL/PLATELET
Abs Immature Granulocytes: 0.01 10*3/uL (ref 0.00–0.07)
Basophils Absolute: 0 10*3/uL (ref 0.0–0.1)
Basophils Relative: 1 %
Eosinophils Absolute: 0.1 10*3/uL (ref 0.0–0.5)
Eosinophils Relative: 2 %
HCT: 41.6 % (ref 36.0–46.0)
Hemoglobin: 14.9 g/dL (ref 12.0–15.0)
Immature Granulocytes: 0 %
Lymphocytes Relative: 13 %
Lymphs Abs: 0.5 10*3/uL — ABNORMAL LOW (ref 0.7–4.0)
MCH: 32.3 pg (ref 26.0–34.0)
MCHC: 35.8 g/dL (ref 30.0–36.0)
MCV: 90 fL (ref 80.0–100.0)
Monocytes Absolute: 0.6 10*3/uL (ref 0.1–1.0)
Monocytes Relative: 15 %
Neutro Abs: 2.7 10*3/uL (ref 1.7–7.7)
Neutrophils Relative %: 69 %
Platelets: 190 10*3/uL (ref 150–400)
RBC: 4.62 MIL/uL (ref 3.87–5.11)
RDW: 12.6 % (ref 11.5–15.5)
WBC: 3.8 10*3/uL — ABNORMAL LOW (ref 4.0–10.5)
nRBC: 0 % (ref 0.0–0.2)

## 2020-07-23 LAB — BASIC METABOLIC PANEL
Anion gap: 8 (ref 5–15)
BUN: 14 mg/dL (ref 8–23)
CO2: 23 mmol/L (ref 22–32)
Calcium: 8 mg/dL — ABNORMAL LOW (ref 8.9–10.3)
Chloride: 101 mmol/L (ref 98–111)
Creatinine, Ser: 0.92 mg/dL (ref 0.44–1.00)
GFR, Estimated: >60 mL/min (ref >60)
Glucose, Bld: 89 mg/dL (ref 70–99)
Potassium: 3.7 mmol/L (ref 3.5–5.1)
Sodium: 132 mmol/L — ABNORMAL LOW (ref 135–145)

## 2020-07-23 LAB — MAGNESIUM: Magnesium: 2 mg/dL (ref 1.7–2.4)

## 2020-07-23 MED ORDER — SENNOSIDES-DOCUSATE SODIUM 8.6-50 MG PO TABS: 1.0000 | ORAL_TABLET | Freq: Two times a day (BID) | ORAL | 0 refills | Status: AC

## 2020-07-23 MED ORDER — ASCORBIC ACID 500 MG PO TABS: 500.0000 mg | ORAL_TABLET | Freq: Every day | ORAL | 0 refills | Status: AC

## 2020-07-23 MED ORDER — ZINC SULFATE 220 (50 ZN) MG PO CAPS: 220.0000 mg | ORAL_CAPSULE | Freq: Every day | ORAL | 0 refills | Status: AC

## 2020-07-23 MED ORDER — PROSOURCE TF PO LIQD: 45.0000 mL | Freq: Two times a day (BID) | ORAL | 0 refills | Status: DC

## 2020-07-23 MED ORDER — PANTOPRAZOLE SODIUM 40 MG PO TBEC: DELAYED_RELEASE_TABLET | ORAL | 0 refills | Status: DC

## 2020-07-23 MED ORDER — ADULT MULTIVITAMIN W/MINERALS CH: 1.0000 | ORAL_TABLET | Freq: Every day | ORAL | 0 refills | Status: AC

## 2020-07-23 NOTE — Progress Notes (Signed)
[facility] Progress Note  Eran Delores Wheless Meckes 70 y.o. 08-02-1950  CC:  Coffee ground emesis x 1  Subjective: Patient denies any complaints.  Denies abdominal pain, nausea, vomiting.  States she had a brown stool this morning.  ROS : Review of Systems  Cardiovascular: Negative for chest pain and palpitations.  Gastrointestinal: Negative for abdominal pain, blood in stool, constipation, diarrhea, heartburn, melena, nausea and vomiting.    Objective: Vital signs in last 24 hours: Vitals:   07/22/20 1900 07/23/20 0336  BP: (!) 168/88 (!) 154/94  Pulse: 100 79  Resp: 20 16  Temp: 99.7 F (37.6 C) 98.6 F (37 C)  SpO2: 100% 100%    Physical Exam:  General:  Alert, oriented, cooperative, no distress  Head:  Normocephalic, without obvious abnormality, atraumatic  Eyes:  Anicteric sclera, EOMs intact  Lungs:   Respirations unlabored  Heart:  Regular rate and rhythm  Abdomen:   Soft, non-tender, non-distended; G tube covered with gauze but tubing without any signs of bleeding  Extremities: Extremities normal, atraumatic, no  edema  Pulses: 2+ and symmetric    Lab Results: Recent Labs    07/21/20 1109 07/23/20 0351  NA 127* 132*  K 4.3 3.7  CL 90* 101  CO2 24 23  GLUCOSE 124* 89  BUN 9 14  CREATININE 0.87 0.92  CALCIUM 9.1 8.0*  MG  --  2.0   Recent Labs    07/21/20 1109  AST 30  ALT 14  ALKPHOS 103  BILITOT 1.7*  PROT 8.2*  ALBUMIN 3.9   Recent Labs    07/22/20 0540 07/23/20 0351  WBC 4.7 3.8*  NEUTROABS 3.6 2.7  HGB 15.5* 14.9  HCT 42.7 41.6  MCV 87.5 90.0  PLT 223 190   No results for input(s): LABPROT, INR in the last 72 hours.    Assessment: Coffee-ground emesis x1: Suspect some gastric irritation after replacement of PEG tube. -Hemoglobin 14.9, stable.  No further signs of GI bleeding.  Hypopharyngeal cancer  Plan: Protonix 40 mg BID for 1-2 months.  Outpatient IR consultation for replacement of PEG tube with  larger size.  OK to discharge from a GI standpoint.  [facility] will sign off. Please contact us if we can be of any further assistance during this hospital stay.   Salley Slaughter PA-C 07/23/2020, 9:17 AM  Contact #  [phone]

## 2020-07-23 NOTE — Plan of Care (Signed)

## 2020-07-23 NOTE — Discharge Summary (Signed)
Physician Discharge Summary  Seana Underwood McNeil BWL:[id number] DOB: Jan 21, 1951 DOA: 07/21/2020  PCP: Patient, No Pcp Per (Inactive)  Admit date: 07/21/2020 Discharge date: 07/23/2020  Admitted From: Home Disposition: Home  Recommendations for Outpatient Follow-up:  1. Follow up with PCP in 1-2 weeks 2. Follow-up with interventional radiology for upsize of G-tube in 1-2 weeks 3. Follow-up with medical oncology as scheduled  Home Health: RN Equipment/Devices: G-tube  Discharge Condition: Stable CODE STATUS: Full code Diet recommendation: Soft diet as tolerates, tube feeds  History of present illness:  Jhana Delores Katti Pelle is a 70 year old female with past medical history significant for hypopharyngeal cancer s/p gastrostomy tube placement who initially presented to radiation oncology clinic because of her dislodgment of her G-tube.  Interventional radiology was consulted with replacement of her G-tube and subsequent patient returned home.  Patient started developing what appeared to be coffee-ground emesis as well as inability to keep anything down in which she represented to the ED for further evaluation.  Patient reports abdominal pain.  No rectal bleeding.  Hospital service consulted for further evaluation and management of coffee-ground emesis and intractable nausea/vomiting.  Hospital course:  Coffee-ground emesis likely secondary to gastritis Vs MW tear Patient presenting to the ED with complaints of coffee-ground emesis.  Etiology likely secondary to Mallory-Weiss tear versus esophagitis versus gastric irritation from dilating her PEG/G-tube tract and replacing tube on 07/20/2020.  Hemoglobin stable, 15.5.  Seen by gastroenterology and recommending supportive care, observation and advancing diet as tolerates; in which she was able.  No indication for EGD at this time.  She will also require a larger G-tube so she can maintain adequate feedings, IR wants to wait  until off isolation precautions.  Per GI recommendations, will continue Protonix 40 mg p.o. twice daily x2 months followed by 40 mg once daily.  Hypopharyngeal cancer s/p gastrostomy tube placement with dislodgment Patient follows with medical oncology, Dr. Alen Blew outpatient.  Initially diagnosed October 15, 2019 following direct lung uroscopy and biopsy.  Completed 6 cycles of cisplatin chemotherapy, currently with radiation on December 26, 2019.  Has PEG tube placement to assist with nutrition supplementation given her continued difficulty with solids orally.  Patient had dislodged gastrostomy tube 07/21/2019 which was replaced by interventional radiology with a 16 French G-tube by Dr. Anselm Pancoast.  Gastroenterology, Dr. Michail Sermon requesting upsize of G-tube, IR would like to wait until off of isolation precautions before proceeding.  Continue oral intake as tolerates and tube feeds.  Outpatient follow-up with IR in 10 days for upsizing of G-tube.  Covid-19 viral infection, incidental finding Asymptomatic.  Oxygenating well on room air.  Has been vaccinated against Covid-19 x 3 with booster in Nov 2021.  Completed 3-day course of remdesivir inpatient.  Continue supportive care with vitamin C/zinc.  Isolation precautions per CDC guidelines.  Discharge Diagnoses:  Active Problems:   Hypopharyngeal cancer ([place])   Hyperbilirubinemia   Erythrocytosis   COVID-19 virus infection    Discharge Instructions  Discharge Instructions    Call MD for:  difficulty breathing, headache or visual disturbances   Complete by: As directed    Call MD for:  extreme fatigue   Complete by: As directed    Call MD for:  persistant dizziness or light-headedness   Complete by: As directed    Call MD for:  persistant nausea and vomiting   Complete by: As directed    Call MD for:  severe uncontrolled pain   Complete by: As directed    Call  MD for:  temperature >100.4   Complete by: As directed    Diet - low sodium heart  healthy   Complete by: As directed    Increase activity slowly   Complete by: As directed      Allergies as of 07/23/2020   No Known Allergies     Medication List    STOP taking these medications   HYDROcodone-acetaminophen 7.5-325 mg/15 ml solution Commonly known as: HYCET   lidocaine 2 % solution Commonly known as: XYLOCAINE   lidocaine-prilocaine cream Commonly known as: EMLA   prochlorperazine 10 MG tablet Commonly known as: COMPAZINE     TAKE these medications   ascorbic acid 500 MG tablet Commonly known as: VITAMIN C Take 1 tablet (500 mg total) by mouth daily. Start taking on: July 24, 2020   feeding supplement (OSMOLITE 1.5 CAL) Liqd Give 1 carton Osmolite 1.5 via G-tube with 60 mL free water before and after each feeding 5 times daily, 3 hours apart.  This provides greater than 90% estimated needs.  1775 cal, 74.5 g protein, 1505 mL free water.  Please send supplies and formula. What changed: Another medication with the same name was added. Make sure you understand how and when to take each.   feeding supplement (PROSource TF) liquid Place 45 mLs into feeding tube 2 (two) times daily. What changed: You were already taking a medication with the same name, and this prescription was added. Make sure you understand how and when to take each.   multivitamin with minerals Tabs tablet Take 1 tablet by mouth daily. Start taking on: July 24, 2020   pantoprazole 40 MG tablet Commonly known as: Protonix Take 1 tablet (40 mg total) by mouth 2 (two) times daily for 60 days, THEN 1 tablet (40 mg total) daily. Start taking on: July 23, 2020   senna-docusate 8.6-50 MG tablet Commonly known as: Senokot-S Take 1 tablet by mouth 2 (two) times daily.   zinc sulfate 220 (50 Zn) MG capsule Take 1 capsule (220 mg total) by mouth daily. Start taking on: July 24, 2020       Follow-up [facility]. Schedule an appointment as soon as  possible for a visit in 1 week(s).   Why: for gtube change/upsize Contact information: [place] [address] [phone]              No Known Allergies  Consultations:  Interventional radiology  [facility]   Procedures/Studies: CT ABDOMEN PELVIS W CONTRAST  Result Date: 07/21/2020 CLINICAL DATA:  Abdominal pain EXAM: CT ABDOMEN AND PELVIS WITH CONTRAST TECHNIQUE: Multidetector CT imaging of the abdomen and pelvis was performed using the standard protocol following bolus administration of intravenous contrast. CONTRAST:  9mL OMNIPAQUE IOHEXOL 300 MG/ML  SOLN COMPARISON:  PET-CT April 08, 2020 FINDINGS: Lower chest: Lung bases are clear.  There is a small hiatal hernia. Hepatobiliary: No focal liver lesions are appreciable. Gallbladder appears borderline distended without appreciable gallbladder wall thickening. There is no appreciable biliary duct dilatation. Pancreas: There is no pancreatic mass or inflammatory focus. Spleen: No splenic lesions are evident. Adrenals/Urinary Tract: Adrenals bilaterally appear unremarkable. There is an extrarenal pelvis on each side, an anatomic variant. There is no renal mass or hydronephrosis on either side. There is a junctional parenchymal defect on the left, an anatomic variant. There is no evident renal or ureteral calculus on either side. Urinary bladder is midline with wall thickness within normal limits.  Urinary bladder is distended. Stomach/Bowel: The rectum and distal sigmoid colon are distended with stool. There is perirectal wall thickening but no soft tissue stranding or fluid in the perirectal region. There is a degree of relatively mild rectal prolapse. Elsewhere, there is no appreciable bowel wall or mesenteric thickening. There is a gastrostomy catheter positioned in the stomach. There is fold thickening in the gastric antral region without ulceration seen by CT. There is no demonstrable bowel obstruction.  The terminal ileum appears normal. There is no evident appendiceal region inflammation. No evident free air or portal venous air. Vascular/Lymphatic: No abdominal aortic aneurysm. There is aortic and iliac artery atherosclerosis. Major venous structures appear patent. No evident adenopathy in the abdomen or pelvis. Reproductive: Uterus absent.  No adnexal masses are evident. Other: No abscess or ascites is appreciable in the abdomen or pelvis. Musculoskeletal: No blastic or lytic bone lesions. No evident abdominal wall or intramuscular lesions. IMPRESSION: 1. Diffuse distension of the distal sigmoid colon and rectum with stool. No perirectal wall thickening without soft tissue stranding or abscess. No perirectal fluid. There may be a degree of stercoral colitis/proctitis. There is a degree of rectal prolapse. 2. Urinary bladder somewhat distended without urinary bladder wall thickening. 3. Gastrostomy catheter positioned in the stomach. Wall thickening in the distal stomach felt to represent a degree of antral gastritis. 4. No evident small bowel obstruction. No abscess in the abdomen or pelvis. 5.  No evident renal or ureteral calculus.  No hydronephrosis. 6.  Small hiatal hernia. 7.  Apparent absence of the uterus. 8.  Aortic Atherosclerosis (ICD10-I70.0). 9. Gallbladder borderline prominent in size without wall thickening. No biliary duct dilatation appreciable by CT. Electronically Signed   By: Lowella Grip III M.D.   On: 07/21/2020 14:06   IR Replc Gastro/Colonic Tube Percut W/Fluoro  Result Date: 07/20/2020 INDICATION: 70 year old with history of hypopharyngeal cancer and history of laparoscopically placed gastrostomy tube. Gastrostomy tube was recently dislodged and patient needs tube replacement. EXAM: REPLACEMENT OF GASTROSTOMY TUBE WITH FLUOROSCOPY MEDICATIONS: Viscous lidocaine ANESTHESIA/SEDATION: Viscous lidocaine CONTRAST:  10 mL Omnipaque 300-administered into the gastric lumen. FLUOROSCOPY  TIME:  Fluoroscopy Time: 1 minutes, 30 seconds, 6 mGy COMPLICATIONS: None immediate. PROCEDURE: Informed written consent was obtained from the patient after a thorough discussion of the procedural risks, benefits and alternatives. All questions were addressed. A timeout was performed prior to the initiation of the procedure. Initially the interventional radiology technologist tried to advance a new tube through the existing hole but this was unsuccessful. Therefore, the anterior abdomen and the gastrostomy site were prepped and draped in sterile fashion. Maximal barrier sterile technique was utilized including caps, mask, sterile gowns, sterile gloves, sterile drape, hand hygiene and skin antiseptic. Five French catheter was advanced into the stomach using fluoroscopic guidance and contrast injection confirmed placement in the stomach. Super stiff Amplatz wire was placed in the stomach. Attempted to dilate the tract to 49 [place] but this was very uncomfortable for the patient. Therefore, the tract was only dilated to 30 [place]. After dilatation, 16 French Entuit catheter was easily advanced over the wire. Balloon was inflated with 6 mL of saline. Contrast injection confirmed placement in the stomach. Catheter was flushed with saline. Fluoroscopic images were taken and saved for this procedure. IMPRESSION: Successful replacement of the percutaneous gastrostomy tube. Patient currently has a 62 French balloon retention gastrostomy tube. Electronically Signed   By: Markus Daft M.D.   On: 07/20/2020 12:58   Portable chest 1 View  Result Date: 07/22/2020 CLINICAL DATA:  70 year old female with shortness of breath. Positive COVID-19. History of head and neck squamous cell carcinoma. EXAM: PORTABLE CHEST 1 VIEW COMPARISON:  CT Abdomen and Pelvis and portable chest 07/21/2020. FINDINGS: Portable AP semi upright view at 0540 hours. Stable right chest power port. Lung volumes and mediastinal contours are stable and normal  aside from mild hyperinflation. Allowing for portable technique the lungs are clear. No pneumothorax or pleural effusion. No acute osseous abnormality identified. IMPRESSION: Pulmonary hyperinflation.  No acute cardiopulmonary abnormality. Electronically Signed   By: Genevie Ann M.D.   On: 07/22/2020 06:05   DG Chest Port 1 View  Result Date: 07/21/2020 CLINICAL DATA:  Abdominal pain EXAM: PORTABLE CHEST 1 VIEW COMPARISON:  None. FINDINGS: Right Port-A-Cath in place with the tip in the SVC. Heart and mediastinal contours are within normal limits. No focal opacities or effusions. No acute bony abnormality. IMPRESSION: No active disease. Electronically Signed   By: Rolm Baptise M.D.   On: 07/21/2020 12:48      Subjective: Patient seen and examined bedside, resting comfortably.  Walking around room.  Ready for discharge home.  Discussed will need to follow-up with radiology for upsizing of G-tube once off isolation precautions.  No other questions or concerns at this time.  Denies headache, no fever/chills/night sweats, no nausea/vomiting/diarrhea, no cough/congestion, no chest pain, no palpitations, no abdominal pain, no weakness, no fatigue, no paresthesias.  No acute events overnight per nursing staff.  Discharge Exam: Vitals:   07/22/20 1900 07/23/20 0336  BP: (!) 168/88 (!) 154/94  Pulse: 100 79  Resp: 20 16  Temp: 99.7 F (37.6 C) 98.6 F (37 C)  SpO2: 100% 100%   Vitals:   07/22/20 1427 07/22/20 1751 07/22/20 1900 07/23/20 0336  BP: (!) 181/85 (!) 152/90 (!) 168/88 (!) 154/94  Pulse: 67 93 100 79  Resp: 18 20 20 16   Temp:  99.4 F (37.4 C) 99.7 F (37.6 C) 98.6 F (37 C)  TempSrc:  Oral Oral Oral  SpO2: 97% 100% 100% 100%  Weight:      Height:        General: Pt is alert, awake, not in acute distress Cardiovascular: RRR, S1/S2 +, no rubs, no gallops Respiratory: CTA bilaterally, no wheezing, no rhonchi, on room air Abdominal: Soft, NT, ND, bowel sounds +, G-tube  noted Extremities: no edema, no cyanosis    The results of significant diagnostics from this hospitalization (including imaging, microbiology, ancillary and laboratory) are listed below for reference.     Microbiology: Recent Results (from the past 240 hour(s))  Resp Panel by RT-PCR (Flu A&B, Covid) Nasopharyngeal Swab     Status: Abnormal   Collection Time: 07/21/20 11:10 AM   Specimen: Nasopharyngeal Swab; Nasopharyngeal(NP) swabs in vial transport medium  Result Value Ref Range Status   SARS Coronavirus 2 by RT PCR POSITIVE (A) NEGATIVE Final    Comment: RESULT CALLED TO, READ BACK BY AND VERIFIED WITH: CONRAD,Q. RN AT 4132 07/21/20 MULLINS,T (NOTE) SARS-CoV-2 target nucleic acids are DETECTED.  The SARS-CoV-2 RNA is generally detectable in upper respiratory specimens during the acute phase of infection. Positive results are indicative of the presence of the identified virus, but do not rule out bacterial infection or co-infection with other pathogens not detected by the test. Clinical correlation with patient history and other diagnostic information is necessary to determine patient infection status. The expected result is Negative.  Fact Sheet for Patients: EntrepreneurPulse.com.au  Fact Sheet for Healthcare  Providers: IncredibleEmployment.be  This test is not yet approved or cleared by the [place] and  has been authorized for detection and/or diagnosis of SARS-CoV-2 by FDA under an Emergency Use Authorization (EUA).  This EUA will remain in effect (meaning this test  can be used) for the duration of  the COVID-19 declaration under Section 564(b)(1) of the Act, 21 U.S.C. section 360bbb-3(b)(1), unless the authorization is terminated or revoked sooner.     Influenza A by PCR NEGATIVE NEGATIVE Final   Influenza B by PCR NEGATIVE NEGATIVE Final    Comment: (NOTE) The Xpert Xpress SARS-CoV-2/FLU/RSV plus assay is intended as  an aid in the diagnosis of influenza from Nasopharyngeal swab specimens and should not be used as a sole basis for treatment. Nasal washings and aspirates are unacceptable for Xpert Xpress SARS-CoV-2/FLU/RSV testing.  Fact Sheet for Patients: EntrepreneurPulse.com.au  Fact Sheet for Healthcare Providers: IncredibleEmployment.be  This test is not yet approved or cleared by the [place] FDA and has been authorized for detection and/or diagnosis of SARS-CoV-2 by FDA under an Emergency Use Authorization (EUA). This EUA will remain in effect (meaning this test can be used) for the duration of the COVID-19 declaration under Section 564(b)(1) of the Act, 21 U.S.C. section 360bbb-3(b)(1), unless the authorization is terminated or revoked.  Performed at [facility], [place] [address], [place], [place] [address]   Blood Culture (routine x 2)     Status: None (Preliminary result)   Collection Time: 07/21/20  4:50 PM   Specimen: BLOOD LEFT FOREARM  Result Value Ref Range Status   Specimen Description   Final    BLOOD LEFT FOREARM Performed at [facility] [address], [place], [place] [address]    Special Requests   Final    BOTTLES DRAWN AEROBIC AND ANAEROBIC Blood Culture adequate volume Performed at [facility] [address], [place], [place] [address]    Culture   Final    NO GROWTH 2 DAYS Performed at [facility] [address], [place], [place] [address]    Report Status PENDING  Incomplete  Blood Culture (routine x 2)     Status: None (Preliminary result)   Collection Time: 07/21/20  6:00 PM   Specimen: BLOOD  Result Value Ref Range Status   Specimen Description   Final    BLOOD RIGHT ANTECUBITAL Performed at [facility] [address] [place], [place] [address]    Special Requests   Final    BOTTLES DRAWN AEROBIC AND ANAEROBIC Blood Culture  adequate volume Performed at [facility] [address], [place], [place] [address]    Culture   Final    NO GROWTH 1 DAY Performed at [facility], [place] [address] [place],  [address]    Report Status PENDING  Incomplete     Labs: BNP (last 3 results) No results for input(s): BNP in the last 8760 hours. Basic Metabolic Panel: Recent Labs  Lab 07/21/20 1109 07/23/20 0351  NA 127* 132*  K 4.3 3.7  CL 90* 101  CO2 24 23  GLUCOSE 124* 89  BUN 9 14  CREATININE 0.87 0.92  CALCIUM 9.1 8.0*  MG  --  2.0   Liver Function Tests: Recent Labs  Lab 07/21/20 1109  AST 30  ALT 14  ALKPHOS 103  BILITOT 1.7*  PROT 8.2*  ALBUMIN 3.9   Recent Labs  Lab 07/21/20 1109  LIPASE 38   No results for input(s): AMMONIA in the last 168 hours. CBC: Recent Labs  Lab 07/21/20 1109 07/22/20 0149 07/22/20 0540 07/23/20 0351  WBC 7.8  --  4.7 3.8*  NEUTROABS  --   --  3.6 2.7  HGB 18.0* 14.5 15.5* 14.9  HCT 48.7* 39.2 42.7 41.6  MCV 85.9  --  87.5 90.0  PLT 252  --  223 190   Cardiac Enzymes: No results for input(s): CKTOTAL, CKMB, CKMBINDEX, TROPONINI in the last 168 hours. BNP: Invalid input(s): POCBNP CBG: Recent Labs  Lab 07/22/20 1626 07/22/20 2029 07/22/20 2355 07/23/20 0333 07/23/20 0725  GLUCAP 149* 92 155* 95 83   D-Dimer Recent Labs    07/21/20 1650  DDIMER 0.85*   Hgb A1c No results for input(s): HGBA1C in the last 72 hours. Lipid Profile Recent Labs    07/21/20 1650  TRIG 44   Thyroid function studies No results for input(s): TSH, T4TOTAL, T3FREE, THYROIDAB in the last 72 hours.  Invalid input(s): FREET3 Anemia work up Recent Labs    07/21/20 1650 07/22/20 0149  FERRITIN 77 71   Urinalysis    Component Value Date/Time   BILIRUBINUR neg 12/24/2013 1653   PROTEINUR neg 12/24/2013 1653   UROBILINOGEN 1.0 12/24/2013 1653   NITRITE neg 12/24/2013 1653   LEUKOCYTESUR Trace 12/24/2013 1653   Sepsis  Labs Invalid input(s): PROCALCITONIN,  WBC,  LACTICIDVEN Microbiology Recent Results (from the past 240 hour(s))  Resp Panel by RT-PCR (Flu A&B, Covid) Nasopharyngeal Swab     Status: Abnormal   Collection Time: 07/21/20 11:10 AM   Specimen: Nasopharyngeal Swab; Nasopharyngeal(NP) swabs in vial transport medium  Result Value Ref Range Status   SARS Coronavirus 2 by RT PCR POSITIVE (A) NEGATIVE Final    Comment: RESULT CALLED TO, READ BACK BY AND VERIFIED WITH: CONRAD,Q. RN AT 8185 07/21/20 MULLINS,T (NOTE) SARS-CoV-2 target nucleic acids are DETECTED.  The SARS-CoV-2 RNA is generally detectable in upper respiratory specimens during the acute phase of infection. Positive results are indicative of the presence of the identified virus, but do not rule out bacterial infection or co-infection with other pathogens not detected by the test. Clinical correlation with patient history and other diagnostic information is necessary to determine patient infection status. The expected result is Negative.  Fact Sheet for Patients: EntrepreneurPulse.com.au  Fact Sheet for Healthcare Providers: IncredibleEmployment.be  This test is not yet approved or cleared by the [place] FDA and  has been authorized for detection and/or diagnosis of SARS-CoV-2 by FDA under an Emergency Use Authorization (EUA).  This EUA will remain in effect (meaning this test  can be used) for the duration of  the COVID-19 declaration under Section 564(b)(1) of the Act, 21 U.S.C. section 360bbb-3(b)(1), unless the authorization is terminated or revoked sooner.     Influenza A by PCR NEGATIVE NEGATIVE Final   Influenza B by PCR NEGATIVE NEGATIVE Final    Comment: (NOTE) The Xpert Xpress SARS-CoV-2/FLU/RSV plus assay is intended as an aid in the diagnosis of influenza from Nasopharyngeal swab specimens and should not be used as a sole basis for treatment. Nasal washings  and aspirates are unacceptable for Xpert Xpress SARS-CoV-2/FLU/RSV testing.  Fact Sheet for Patients: EntrepreneurPulse.com.au  Fact Sheet for Healthcare Providers: IncredibleEmployment.be  This test is not yet approved or cleared by the [place] FDA and has been authorized for detection and/or diagnosis of SARS-CoV-2 by FDA under an Emergency Use Authorization (EUA). This EUA will remain in  effect (meaning this test can be used) for the duration of the COVID-19 declaration under Section 564(b)(1) of the Act, 21 U.S.C. section 360bbb-3(b)(1), unless the authorization is terminated or revoked.  Performed at [facility], [place] [address] [place], [place] [address]   Blood Culture (routine x 2)     Status: None (Preliminary result)   Collection Time: 07/21/20  4:50 PM   Specimen: BLOOD LEFT FOREARM  Result Value Ref Range Status   Specimen Description   Final    BLOOD LEFT FOREARM Performed at [facility] [address] [place], [place] [address]    Special Requests   Final    BOTTLES DRAWN AEROBIC AND ANAEROBIC Blood Culture adequate volume Performed at [facility] [address], [place], [place] [address]    Culture   Final    NO GROWTH 2 DAYS Performed at [facility] [address], [place], [place] [address]    Report Status PENDING  Incomplete  Blood Culture (routine x 2)     Status: None (Preliminary result)   Collection Time: 07/21/20  6:00 PM   Specimen: BLOOD  Result Value Ref Range Status   Specimen Description   Final    BLOOD RIGHT ANTECUBITAL Performed at [facility] [address], [place], [place] [address]    Special Requests   Final    BOTTLES DRAWN AEROBIC AND ANAEROBIC Blood Culture adequate volume Performed at [facility] [address] [place], [place] [address]    Culture   Final    NO GROWTH 1  DAY Performed at [facility], [place] [address], [place], [place] [address]    Report Status PENDING  Incomplete     Time coordinating discharge: Over 30 minutes  SIGNED:   Foch Rosenwald J [place] DO  [facility] 07/23/2020, 10:20 AM

## 2020-07-23 NOTE — Progress Notes (Signed)
Discharge instructions explained to patient. Patient verbalized understanding. No other needs identified. Patient endorsed when her sister arrives downstairs, her sister would call upstairs. Per patient my sister is 5 minutes away. Awaiting notification of sister arrival.

## 2020-07-26 LAB — CULTURE, BLOOD (ROUTINE X 2)
Culture: NO GROWTH
Special Requests: ADEQUATE

## 2020-07-27 LAB — CULTURE, BLOOD (ROUTINE X 2)
Culture: NO GROWTH
Special Requests: ADEQUATE

## 2020-08-03 NOTE — Progress Notes (Signed)
Oncology Nurse Navigator Documentation  Ms. Righetti called me today to inform me that her PEG tube has come out for the second time this month. She reports that the site to her abdomen has closed and there is no redness or drainage present. She had it replaced on 4/11 after falling out at home and she declines to have the PEG replaced again at this time. She voiced to me that she is eating/drinking orally and plans to push extra nutrition and hydration. I have notified Dr. Isidore Moos, Dr. Alen Blew, and Dory Peru RD of the above. Ms. Royer knows to call me if she as further questions or concerns.   Harlow Asa RN, BSN, OCN Head & Neck Oncology Nurse [place] at [facility] Phone # [phone]  Fax # [phone]

## 2020-08-11 ENCOUNTER — Emergency Department (HOSPITAL_COMMUNITY): Payer: Medicare Other

## 2020-08-11 ENCOUNTER — Encounter (HOSPITAL_COMMUNITY): Payer: Self-pay

## 2020-08-11 ENCOUNTER — Inpatient Hospital Stay (HOSPITAL_COMMUNITY)
Admission: EM | Admit: 2020-08-11 | Discharge: 2020-08-14 | DRG: 280 | Disposition: A | Discharge status: Home / Self Care | Payer: Medicare Other | Attending: Internal Medicine | Admitting: Internal Medicine

## 2020-08-11 ENCOUNTER — Inpatient Hospital Stay (HOSPITAL_COMMUNITY): Payer: Medicare Other

## 2020-08-11 DIAGNOSIS — Z8616 Personal history of COVID-19: Secondary | ICD-10-CM

## 2020-08-11 DIAGNOSIS — E871 Hypo-osmolality and hyponatremia: Secondary | ICD-10-CM

## 2020-08-11 DIAGNOSIS — N179 Acute kidney failure, unspecified: Secondary | ICD-10-CM

## 2020-08-11 DIAGNOSIS — C139 Malignant neoplasm of hypopharynx, unspecified: Secondary | ICD-10-CM | POA: Diagnosis not present

## 2020-08-11 DIAGNOSIS — F17210 Nicotine dependence, cigarettes, uncomplicated: Secondary | ICD-10-CM | POA: Diagnosis present

## 2020-08-11 DIAGNOSIS — R079 Chest pain, unspecified: Secondary | ICD-10-CM

## 2020-08-11 DIAGNOSIS — R531 Weakness: Secondary | ICD-10-CM | POA: Diagnosis not present

## 2020-08-11 DIAGNOSIS — I25110 Atherosclerotic heart disease of native coronary artery with unstable angina pectoris: Secondary | ICD-10-CM | POA: Diagnosis present

## 2020-08-11 DIAGNOSIS — Z923 Personal history of irradiation: Secondary | ICD-10-CM

## 2020-08-11 DIAGNOSIS — I214 Non-ST elevation (NSTEMI) myocardial infarction: Secondary | ICD-10-CM

## 2020-08-11 DIAGNOSIS — E875 Hyperkalemia: Secondary | ICD-10-CM | POA: Diagnosis present

## 2020-08-11 DIAGNOSIS — K219 Gastro-esophageal reflux disease without esophagitis: Secondary | ICD-10-CM | POA: Diagnosis not present

## 2020-08-11 DIAGNOSIS — E43 Unspecified severe protein-calorie malnutrition: Secondary | ICD-10-CM | POA: Insufficient documentation

## 2020-08-11 DIAGNOSIS — R9431 Abnormal electrocardiogram [ECG] [EKG]: Secondary | ICD-10-CM | POA: Diagnosis not present

## 2020-08-11 DIAGNOSIS — R739 Hyperglycemia, unspecified: Secondary | ICD-10-CM | POA: Diagnosis present

## 2020-08-11 DIAGNOSIS — R1110 Vomiting, unspecified: Secondary | ICD-10-CM | POA: Diagnosis not present

## 2020-08-11 DIAGNOSIS — I5181 Takotsubo syndrome: Secondary | ICD-10-CM | POA: Diagnosis not present

## 2020-08-11 DIAGNOSIS — E876 Hypokalemia: Secondary | ICD-10-CM

## 2020-08-11 DIAGNOSIS — K5900 Constipation, unspecified: Secondary | ICD-10-CM | POA: Diagnosis not present

## 2020-08-11 DIAGNOSIS — E872 Acidosis: Secondary | ICD-10-CM | POA: Diagnosis not present

## 2020-08-11 DIAGNOSIS — R1084 Generalized abdominal pain: Secondary | ICD-10-CM | POA: Diagnosis not present

## 2020-08-11 DIAGNOSIS — R1310 Dysphagia, unspecified: Secondary | ICD-10-CM | POA: Diagnosis not present

## 2020-08-11 DIAGNOSIS — Z9221 Personal history of antineoplastic chemotherapy: Secondary | ICD-10-CM | POA: Diagnosis not present

## 2020-08-11 DIAGNOSIS — Z681 Body mass index (BMI) 19 or less, adult: Secondary | ICD-10-CM | POA: Diagnosis not present

## 2020-08-11 DIAGNOSIS — Z8249 Family history of ischemic heart disease and other diseases of the circulatory system: Secondary | ICD-10-CM

## 2020-08-11 DIAGNOSIS — R109 Unspecified abdominal pain: Secondary | ICD-10-CM | POA: Diagnosis not present

## 2020-08-11 DIAGNOSIS — R0789 Other chest pain: Secondary | ICD-10-CM | POA: Diagnosis not present

## 2020-08-11 HISTORY — DX: Abnormal electrocardiogram (ECG) (EKG): R94.31

## 2020-08-11 LAB — BASIC METABOLIC PANEL
Anion gap: 17 — ABNORMAL HIGH (ref 5–15)
BUN: 11 mg/dL (ref 8–23)
CO2: 19 mmol/L — ABNORMAL LOW (ref 22–32)
Calcium: 9.2 mg/dL (ref 8.9–10.3)
Chloride: 92 mmol/L — ABNORMAL LOW (ref 98–111)
Creatinine, Ser: 1.28 mg/dL — ABNORMAL HIGH (ref 0.44–1.00)
GFR, Estimated: 46 mL/min — ABNORMAL LOW (ref >60)
Glucose, Bld: 119 mg/dL — ABNORMAL HIGH (ref 70–99)
Potassium: 4.5 mmol/L (ref 3.5–5.1)
Sodium: 128 mmol/L — ABNORMAL LOW (ref 135–145)

## 2020-08-11 LAB — CBG MONITORING, ED: Glucose-Capillary: 116 mg/dL — ABNORMAL HIGH (ref 70–99)

## 2020-08-11 LAB — CBC
HCT: 47.4 % — ABNORMAL HIGH (ref 36.0–46.0)
Hemoglobin: 17.6 g/dL — ABNORMAL HIGH (ref 12.0–15.0)
MCH: 31.5 pg (ref 26.0–34.0)
MCHC: 37.1 g/dL — ABNORMAL HIGH (ref 30.0–36.0)
MCV: 84.8 fL (ref 80.0–100.0)
Platelets: 258 10*3/uL (ref 150–400)
RBC: 5.59 MIL/uL — ABNORMAL HIGH (ref 3.87–5.11)
RDW: 12.5 % (ref 11.5–15.5)
WBC: 4.6 10*3/uL (ref 4.0–10.5)
nRBC: 0 % (ref 0.0–0.2)

## 2020-08-11 LAB — COMPREHENSIVE METABOLIC PANEL
ALT: 38 U/L (ref 0–44)
AST: 85 U/L — ABNORMAL HIGH (ref 15–41)
Albumin: 3.7 g/dL (ref 3.5–5.0)
Alkaline Phosphatase: 82 U/L (ref 38–126)
Anion gap: 11 (ref 5–15)
BUN: 11 mg/dL (ref 8–23)
CO2: 24 mmol/L (ref 22–32)
Calcium: 8.8 mg/dL — ABNORMAL LOW (ref 8.9–10.3)
Chloride: 88 mmol/L — ABNORMAL LOW (ref 98–111)
Creatinine, Ser: 1.66 mg/dL — ABNORMAL HIGH (ref 0.44–1.00)
GFR, Estimated: 33 mL/min — ABNORMAL LOW (ref >60)
Glucose, Bld: 121 mg/dL — ABNORMAL HIGH (ref 70–99)
Potassium: >7.5 mmol/L (ref 3.5–5.1)
Sodium: 123 mmol/L — ABNORMAL LOW (ref 135–145)
Total Bilirubin: 1.7 mg/dL — ABNORMAL HIGH (ref 0.3–1.2)
Total Protein: 7.5 g/dL (ref 6.5–8.1)

## 2020-08-11 LAB — I-STAT CHEM 8, ED
BUN: 10 mg/dL (ref 8–23)
Calcium, Ion: 1 mmol/L — ABNORMAL LOW (ref 1.15–1.40)
Chloride: 91 mmol/L — ABNORMAL LOW (ref 98–111)
Creatinine, Ser: 1.1 mg/dL — ABNORMAL HIGH (ref 0.44–1.00)
Glucose, Bld: 305 mg/dL — ABNORMAL HIGH (ref 70–99)
HCT: 48 % — ABNORMAL HIGH (ref 36.0–46.0)
Hemoglobin: 16.3 g/dL — ABNORMAL HIGH (ref 12.0–15.0)
Potassium: 4.3 mmol/L (ref 3.5–5.1)
Sodium: 127 mmol/L — ABNORMAL LOW (ref 135–145)
TCO2: 21 mmol/L — ABNORMAL LOW (ref 22–32)

## 2020-08-11 LAB — LIPASE, BLOOD: Lipase: 40 U/L (ref 11–51)

## 2020-08-11 LAB — ECHOCARDIOGRAM COMPLETE
Area-P 1/2: 1.81 cm2
Height: 65 in
S' Lateral: 2.45 cm
Weight: 1568 [oz_av]

## 2020-08-11 LAB — TROPONIN I (HIGH SENSITIVITY)
Troponin I (High Sensitivity): 1016 ng/L (ref <18)
Troponin I (High Sensitivity): 1197 ng/L (ref <18)

## 2020-08-11 MED ORDER — CALCIUM GLUCONATE 10 % IV SOLN: 1.0000 g | Freq: Once | INTRAVENOUS | Status: DC

## 2020-08-11 MED ORDER — ACETAMINOPHEN 325 MG PO TABS: 650.0000 mg | ORAL_TABLET | ORAL | Status: DC | PRN

## 2020-08-11 MED ORDER — MORPHINE SULFATE (PF) 2 MG/ML IV SOLN
2.0000 mg | Freq: Once | INTRAVENOUS | Status: AC
  Administered 2020-08-11: 2 mg via INTRAVENOUS
  Filled 2020-08-11: qty 1

## 2020-08-11 MED ORDER — ASPIRIN 81 MG PO CHEW
324.0000 mg | CHEWABLE_TABLET | Freq: Once | ORAL | Status: AC
  Administered 2020-08-11: 324 mg via ORAL
  Filled 2020-08-11: qty 4

## 2020-08-11 MED ORDER — PANTOPRAZOLE SODIUM 40 MG IV SOLR
40.0000 mg | INTRAVENOUS | Status: DC
  Administered 2020-08-11 – 2020-08-12 (×2): 40 mg via INTRAVENOUS
  Filled 2020-08-11 (×2): qty 40

## 2020-08-11 MED ORDER — SODIUM CHLORIDE 0.9 % IV SOLN: INTRAVENOUS | Status: DC

## 2020-08-11 MED ORDER — MORPHINE SULFATE (PF) 2 MG/ML IV SOLN
1.0000 mg | INTRAVENOUS | Status: DC | PRN
Start: 2020-08-11 — End: 2020-08-12
  Administered 2020-08-12: 1 mg via INTRAVENOUS
  Filled 2020-08-11: qty 1

## 2020-08-11 MED ORDER — LACTATED RINGERS IV SOLN: INTRAVENOUS | Status: DC

## 2020-08-11 MED ORDER — INSULIN ASPART 100 UNIT/ML IV SOLN
10.0000 [IU] | Freq: Once | INTRAVENOUS | Status: AC
  Administered 2020-08-11: 10 [IU] via INTRAVENOUS
  Filled 2020-08-11: qty 0.1

## 2020-08-11 MED ORDER — ONDANSETRON HCL 4 MG/2ML IJ SOLN
4.0000 mg | Freq: Once | INTRAMUSCULAR | Status: AC
  Administered 2020-08-11: 4 mg via INTRAVENOUS
  Filled 2020-08-11: qty 2

## 2020-08-11 MED ORDER — LORAZEPAM 2 MG/ML IJ SOLN: 0.5000 mg | Freq: Three times a day (TID) | INTRAMUSCULAR | Status: DC | PRN

## 2020-08-11 MED ORDER — DEXTROSE 50 % IV SOLN
1.0000 | Freq: Once | INTRAVENOUS | Status: AC
  Administered 2020-08-11: 50 mL via INTRAVENOUS
  Filled 2020-08-11: qty 50

## 2020-08-11 MED ORDER — HEPARIN (PORCINE) 25000 UT/250ML-% IV SOLN
800.0000 [IU]/h | INTRAVENOUS | Status: DC
  Administered 2020-08-11: 550 [IU]/h via INTRAVENOUS
  Administered 2020-08-12: 900 [IU]/h via INTRAVENOUS
  Filled 2020-08-11 (×2): qty 250

## 2020-08-11 MED ORDER — NITROGLYCERIN IN D5W 200-5 MCG/ML-% IV SOLN
0.0000 ug/min | INTRAVENOUS | Status: DC
  Administered 2020-08-11: 5 ug/min via INTRAVENOUS
  Filled 2020-08-11: qty 250

## 2020-08-11 MED ORDER — ONDANSETRON HCL 4 MG/2ML IJ SOLN
4.0000 mg | Freq: Four times a day (QID) | INTRAMUSCULAR | Status: DC | PRN
  Administered 2020-08-12 (×2): 4 mg via INTRAVENOUS
  Filled 2020-08-11 (×2): qty 2

## 2020-08-11 MED ORDER — LACTATED RINGERS IV BOLUS
1000.0000 mL | Freq: Once | INTRAVENOUS | Status: AC
  Administered 2020-08-11: 1000 mL via INTRAVENOUS

## 2020-08-11 MED ORDER — CALCIUM GLUCONATE-NACL 1-0.675 GM/50ML-% IV SOLN
1.0000 g | Freq: Once | INTRAVENOUS | Status: AC
  Administered 2020-08-11: 1000 mg via INTRAVENOUS
  Filled 2020-08-11: qty 50

## 2020-08-11 MED ORDER — HEPARIN BOLUS VIA INFUSION
2000.0000 [IU] | Freq: Once | INTRAVENOUS | Status: AC
  Administered 2020-08-11: 2000 [IU] via INTRAVENOUS
  Filled 2020-08-11: qty 2000

## 2020-08-11 NOTE — H&P (Signed)
History and Physical    Tina Kennedy GLO:[id number] DOB: Jan 05, 1951 DOA: 08/11/2020  PCP: Patient, No Pcp Per (Inactive)  Patient coming from: Home  Chief Complaint: Cough  HPI: Tina Kennedy is a 70 y.o. female with medical history significant of hypopharyngeal CA, GERD, dysphagia. Presenting with cough and chest pain. She reports that she was in her normal state of health until this morning. Her symptoms started with a non-productive cough and quickly progressed to include midsternal, non-radiating, constant chest pain. She didn't try any treatments for her cough or chest pain. She decided to quickly come to the ED. She denies any sick contacts. She denies any hemoptysis, fever, N/V. She denies any other aggravating or alleviating symptoms.    ED Course: CXR was negative. Initial trp was 1016. Cardiology was called and reviewed EKG. She was deemed not to be in STEMI. It was recommended to treat as NSTEMI and cardiology would follow. Initial K+ was >7.5, but repeat was in normal limits. It was deemed that the initial study was spurious. TRH was called for admission.   Review of Systems:  Review of systems is otherwise negative for all not mentioned in HPI.   PMHx Past Medical History:  Diagnosis Date  . Hypopharyngeal cancer ([place])     PSHx Past Surgical History:  Procedure Laterality Date  . DIRECT LARYNGOSCOPY N/A 10/15/2019   Procedure: DIRECT LARYNGOSCOPY w/BIOPSY;  Surgeon: Izora Gala, MD;  Location: [place];  Service: ENT;  Laterality: N/A;  . GASTROSTOMY N/A 12/04/2019   Procedure: LAPAROSCOPIC G TUBE PLACEMENT;  Surgeon: Ralene Ok, MD;  Location: [facility];  Service: General;  Laterality: N/A;  . IR GASTROSTOMY TUBE MOD SED  11/15/2019  . IR IMAGING GUIDED PORT INSERTION  11/15/2019  . IR REPLC GASTRO/COLONIC TUBE PERCUT W/FLUORO  07/20/2020  . RIGID ESOPHAGOSCOPY N/A 10/15/2019   Procedure: RIGID ESOPHAGOSCOPY;  Surgeon: Izora Gala, MD;   Location: [place];  Service: ENT;  Laterality: N/A;    SocHx  reports that she has been smoking cigarettes. She has been smoking about 0.25 packs per day. She has never used smokeless tobacco. She reports previous alcohol use of about 1.0 standard drink of alcohol per week. She reports previous drug use. Drug: Marijuana.  No Known Allergies  FamHx Family History  Problem Relation Age of Onset  . Hypertension Mother   . Hypertension Sister     Prior to Admission medications   Medication Sig Start Date End Date Taking? Authorizing Provider  ascorbic acid (VITAMIN C) 500 MG tablet Take 1 tablet (500 mg total) by mouth daily. 07/24/20 08/23/20 Yes [place] Donnamarie Poag, DO  Multiple Vitamin (MULTIVITAMIN WITH MINERALS) TABS tablet Take 1 tablet by mouth daily. 07/24/20 10/22/20 Yes [place] Eric J, DO  pantoprazole (PROTONIX) 40 MG tablet Take 40 mg by mouth 2 (two) times daily.   Yes [provider]  senna-docusate (SENOKOT-S) 8.6-50 MG tablet Take 1 tablet by mouth 2 (two) times daily. 07/23/20 10/21/20 Yes [place] Eric J, DO  zinc sulfate 220 (50 Zn) MG capsule Take 1 capsule (220 mg total) by mouth daily. 07/24/20 08/23/20 Yes [place] Donnamarie Poag, DO  Nutritional Supplements (FEEDING SUPPLEMENT, OSMOLITE 1.5 CAL,) LIQD Give 1 carton Osmolite 1.5 via G-tube with 60 mL free water before and after each feeding 5 times daily, 3 hours apart.  This provides greater than 90% estimated needs.  1775 cal, 74.5 g protein, 1505 mL free water.  Please send supplies and formula. Patient not  taking: No sig reported 12/10/19   Eppie Gibson, MD  Nutritional Supplements (FEEDING SUPPLEMENT, PROSOURCE TF,) liquid Place 45 mLs into feeding tube 2 (two) times daily. Patient not taking: No sig reported 07/23/20 10/21/20  [place] Donnamarie Poag, DO  pantoprazole (PROTONIX) 40 MG tablet Take 1 tablet (40 mg total) by mouth 2 (two) times daily for 60 days, THEN 1 tablet (40 mg total) daily. Patient not taking: Reported on 08/11/2020  07/23/20 12/20/20  [place] Eric J, [place]    Physical Exam: Vitals:   08/11/20 1251 08/11/20 1252 08/11/20 1422 08/11/20 1530  BP: (!) 137/107  (!) 149/89 134/73  Pulse: 92  83 90  Resp: 18  (!) 25 (!) 29  Temp: 98.1 F (36.7 C)     TempSrc: Oral     SpO2: 99%  100% 100%  Weight:  44.5 kg 44.5 kg   Height:  5\' 5"  (1.651 m) 5\' 5"  (1.651 m)     General: 70 y.o. female resting in bed; she is anxious Eyes: PERRL, normal sclera ENMT: Nares patent w/o discharge, orophaynx clear, dentition normal, ears w/o discharge/lesions/ulcers Neck: Supple, trachea midline Cardiovascular: tachy, +S1, S2, no m/g/r, equal pulses throughout; non-reproducible chest pain on exam Respiratory: CTABL, no w/r/r, slightly increased WOB GI: BS+, ND, TTP epigastric, no masses noted, no organomegaly noted MSK: No e/c/c Skin: No rashes, bruises, ulcerations noted Neuro: A&O x 3, no focal deficits Psyc: anxious but cooperative  Labs on Admission: I have personally reviewed following labs and imaging studies  CBC: Recent Labs  Lab 08/11/20 1329 08/11/20 1534  WBC 4.6  --   HGB 17.6* 16.3*  HCT 47.4* 48.0*  MCV 84.8  --   PLT 258  --    Basic Metabolic Panel: Recent Labs  Lab 08/11/20 1328 08/11/20 1534  NA 123* 127*  K >7.5* 4.3  CL 88* 91*  CO2 24  --   GLUCOSE 121* 305*  BUN 11 10  CREATININE 1.66* 1.10*  CALCIUM 8.8*  --    GFR: Estimated Creatinine Clearance: 34.4 mL/min (A) (by C-G formula based on SCr of 1.1 mg/dL (H)). Liver Function Tests: Recent Labs  Lab 08/11/20 1328  AST 85*  ALT 38  ALKPHOS 82  BILITOT 1.7*  PROT 7.5  ALBUMIN 3.7   Recent Labs  Lab 08/11/20 1328  LIPASE 40   No results for input(s): AMMONIA in the last 168 hours. Coagulation Profile: No results for input(s): INR, PROTIME in the last 168 hours. Cardiac Enzymes: No results for input(s): CKTOTAL, CKMB, CKMBINDEX, TROPONINI in the last 168 hours. BNP (last 3 results) No results for input(s): PROBNP in  the last 8760 hours. HbA1C: No results for input(s): HGBA1C in the last 72 hours. CBG: No results for input(s): GLUCAP in the last 168 hours. Lipid Profile: No results for input(s): CHOL, HDL, LDLCALC, TRIG, CHOLHDL, LDLDIRECT in the last 72 hours. Thyroid Function Tests: No results for input(s): TSH, T4TOTAL, FREET4, T3FREE, THYROIDAB in the last 72 hours. Anemia Panel: No results for input(s): VITAMINB12, FOLATE, FERRITIN, TIBC, IRON, RETICCTPCT in the last 72 hours. Urine analysis:    Component Value Date/Time   BILIRUBINUR neg 12/24/2013 1653   PROTEINUR neg 12/24/2013 1653   UROBILINOGEN 1.0 12/24/2013 1653   NITRITE neg 12/24/2013 1653   LEUKOCYTESUR Trace 12/24/2013 1653    Radiological Exams on Admission: DG Chest 2 View  Result Date: 08/11/2020 CLINICAL DATA:  Chest pain, weakness and vomiting since this morning. EXAM: CHEST - 2 VIEW COMPARISON:  07/23/2018 FINDINGS: Cardiac silhouette is normal in size. No mediastinal or hilar masses or evidence of adenopathy. Right anterior chest wall Port-A-Cath extends through the internal jugular vein, tip in the mid superior vena cava, stable. Lungs are hyperexpanded, but clear. No pleural effusion or pneumothorax. Skeletal structures are demineralized but intact. IMPRESSION: 1. No acute cardiopulmonary disease. Electronically Signed   By: Lajean Manes M.D.   On: 08/11/2020 14:14    EKG: Independently reviewed. Sinus, no clear ST elevations  Assessment/Plan NSTEMI Chest pain     - admit to inpt, cardiac tele @ [facility]     - cardiology consulted; appreciate their assistance     - continue heparin gtt, NTG gtt     - trp 1016 -> 1197; continue to trend     - EKG as above     - Chest pain is mid-sternal and epigastric; it's non-reproducible  Chronic Hyponatremia     - her Na+ ranges between 133 and 127 going back to 02/2020     - her 123 from earlier today seems spurious     - check urine studies for now and follow  Hyperglycemia      - given amp D50 earlier in ED stay; no history of DM, follow for now  Elevated WBC     - likely reactive; follow  High gap metabolic acidosis     - check lactic acids     - she hyperglycemic only after amp D50, no Hx of DM; follow  Hypopharyngeal Cancer     - follows with Dr. Alen Blew     - radiation and chemo last year     - continue outpt follow up  GERD     - continue protonix  DVT prophylaxis: heparin gtt  Code Status: FULL  Family Communication: None at bedside  Consults called: Cardiology (Dr. [place]) called by EDP  Status is: Inpatient  Remains inpatient appropriate because:Inpatient level of care appropriate due to severity of illness   Dispo: The patient is from: Home              Anticipated d/c is to: Home              Patient currently is not medically stable to d/c.   Difficult to place patient No  Time spent coordinating admission: 75 minutes  [place] [facility]  If 7PM-7AM, please contact night-coverage [email or web address]  08/11/2020, 3:50 PM

## 2020-08-11 NOTE — ED Notes (Signed)
PureWick placed on patient. 

## 2020-08-11 NOTE — ED Triage Notes (Signed)
Pt presents with c/o chest pain, weakness, and vomiting since this morning.

## 2020-08-11 NOTE — ED Notes (Signed)
Patient transported to CT 

## 2020-08-11 NOTE — Consult Note (Incomplete)
Cardiology Consultation:   Patient ID: Tina Kennedy MRN: [id number]; DOB: [birth year]  Admit date: 08/11/2020 Date of Consult: 08/11/2020  PCP:  Patient, No Pcp Per (Inactive)   [facility] Providers Cardiologist:  None   { Click here to update MD or APP on Care Team, Refresh:1}     Patient Profile:   Tina Kennedy is a 70 y.o. female with a hx of hypopharyngeal cancer s/p gastrostomy tube placement w/ replacement 07/21/2020, no hx DM/HTN/HLD, who is being seen 08/11/2020 for the evaluation of chest pain at the request of Dr Marylyn Ishihara.  History of Present Illness:   Tina Kennedy was admitted 04/12-04/12 with N&V and coffee ground emesis that started after her G tube had become dislodged and was replaced. Seen by GI and concern for gastritis vs MW tear vs gastric irritation from dilating her PEG/G tube tract. Protonix bid started. She was positive for Covid that admit (incidental finding).  Of note, her G tube fell out again on  04/25, but she refused to have it reinserted, intending to push po intake.   Today, pt came to the ER for CP that started after she began coughing. LUQ pain. Trop > 1000, cards asked to see.   ***   Past Medical History:  Diagnosis Date  . Hypopharyngeal cancer [facility])     Past Surgical History:  Procedure Laterality Date  . DIRECT LARYNGOSCOPY N/A 10/15/2019   Procedure: DIRECT LARYNGOSCOPY w/BIOPSY;  Surgeon: Izora Gala, MD;  Location: [place];  Service: ENT;  Laterality: N/A;  . GASTROSTOMY N/A 12/04/2019   Procedure: LAPAROSCOPIC G TUBE PLACEMENT;  Surgeon: Ralene Ok, MD;  Location[facility];  Service: General;  Laterality: N/A;  . IR GASTROSTOMY TUBE MOD SED  11/15/2019  . IR IMAGING GUIDED PORT INSERTION  11/15/2019  . IR REPLC GASTRO/COLONIC TUBE PERCUT W/FLUORO  07/20/2020  . RIGID ESOPHAGOSCOPY N/A 10/15/2019   Procedure: RIGID ESOPHAGOSCOPY;  Surgeon: Izora Gala, MD;  Location: [place];  Service: ENT;  Laterality: N/A;      Home Medications:  Prior to Admission medications   Medication Sig Start Date End Date Taking? Authorizing Provider  ascorbic acid (VITAMIN C) 500 MG tablet Take 1 tablet (500 mg total) by mouth daily. 07/24/20 08/23/20 Yes [place] Donnamarie Poag, DO  Multiple Vitamin (MULTIVITAMIN WITH MINERALS) TABS tablet Take 1 tablet by mouth daily. 07/24/20 10/22/20 Yes [place] Eric J, DO  pantoprazole (PROTONIX) 40 MG tablet Take 40 mg by mouth 2 (two) times daily.   Yes [provider]  senna-docusate (SENOKOT-S) 8.6-50 MG tablet Take 1 tablet by mouth 2 (two) times daily. 07/23/20 10/21/20 Yes [place] Eric J, DO  zinc sulfate 220 (50 Zn) MG capsule Take 1 capsule (220 mg total) by mouth daily. 07/24/20 08/23/20 Yes [place] Donnamarie Poag, DO  Nutritional Supplements (FEEDING SUPPLEMENT, OSMOLITE 1.5 CAL,) LIQD Give 1 carton Osmolite 1.5 via G-tube with 60 mL free water before and after each feeding 5 times daily, 3 hours apart.  This provides greater than 90% estimated needs.  1775 cal, 74.5 g protein, 1505 mL free water.  Please send supplies and formula. Patient not taking: No sig reported 12/10/19   Eppie Gibson, MD  Nutritional Supplements (FEEDING SUPPLEMENT, PROSOURCE TF,) liquid Place 45 mLs into feeding tube 2 (two) times daily. Patient not taking: No sig reported 07/23/20 10/21/20  [place] Donnamarie Poag, DO  pantoprazole (PROTONIX) 40 MG tablet Take 1 tablet (40 mg total) by mouth 2 (two) times daily for  60 days, THEN 1 tablet (40 mg total) daily. Patient not taking: Reported on 08/11/2020 07/23/20 12/20/20  [place] J, DO    Inpatient Medications: Scheduled Meds:  Continuous Infusions: . sodium chloride 20 mL/hr at 08/11/20 1335  . heparin 550 Units/hr (08/11/20 1635)  . lactated ringers 125 mL/hr at 08/11/20 1629  . nitroGLYCERIN 10 mcg/min (08/11/20 1633)   PRN Meds:   Allergies:   No Known Allergies  Social History:   Social History   Socioeconomic History  . Marital status: Divorced     Spouse name: Not on file  . Number of children: 1  . Years of education: 55  . Highest education level: Not on file  Occupational History  . Not on file  Tobacco Use  . Smoking status: Current Some Day Smoker    Packs/day: 0.25    Types: Cigarettes  . Smokeless tobacco: Never Used  . Tobacco comment: Currently 1-2 cigarettes daily  Vaping Use  . Vaping Use: Never used  Substance and Sexual Activity  . Alcohol use: Not Currently    Alcohol/week: 1.0 standard drink    Types: 1 Cans of beer per week    Comment: Occasional on weekend  . Drug use: Not Currently    Types: Marijuana    Comment: not used in 11/10/2019   . Sexual activity: Yes  Other Topics Concern  . Not on file  Social History Narrative  . Not on file   Social Determinants of Health   Financial Resource Strain: Not on file  Food Insecurity: Not on file  Transportation Needs: Unmet Transportation Needs  . Lack of Transportation (Medical): Yes  . Lack of Transportation (Non-Medical): Yes  Physical Activity: Not on file  Stress: Not on file  Social Connections: Not on file  Intimate Partner Violence: Not on file    Family History:    Family History  Problem Relation Age of Onset  . Hypertension Mother   . Hypertension Sister    Family Status  Relation Name Status  . Mother  Deceased at age 6       Natural causes  . Sister  Alive  . Father  Deceased at age 29       Unknown      ROS:  Please see the history of present illness.  All other ROS reviewed and negative.     Physical Exam/Data:   Vitals:   08/11/20 1251 08/11/20 1252 08/11/20 1422 08/11/20 1530  BP: (!) 137/107  (!) 149/89 134/73  Pulse: 92  83 90  Resp: 18  (!) 25 (!) 29  Temp: 98.1 F (36.7 C)     TempSrc: Oral     SpO2: 99%  100% 100%  Weight:  44.5 kg 44.5 kg   Height:  5\' 5"  (1.651 m) 5\' 5"  (1.651 m)     Intake/Output Summary (Last 24 hours) at 08/11/2020 1648 Last data filed at 08/11/2020 1625 Gross per 24 hour  Intake  1050 ml  Output -  Net 1050 ml   Last 3 Weights 08/11/2020 08/11/2020 07/21/2020  Weight (lbs) 98 lb 98 lb 98 lb  Weight (kg) 44.453 kg 44.453 kg 44.453 kg     Body mass index is 16.31 kg/m.  General:  Well nourished, well developed, in no acute distress*** HEENT: normal Lymph: no adenopathy Neck: no JVD Endocrine:  No thryomegaly Vascular: No carotid bruits; FA pulses 2+ bilaterally without bruits  Cardiac:  normal S1, S2; RRR; no murmur *** Lungs:  clear to auscultation bilaterally, no wheezing, rhonchi or rales  Abd: soft, nontender, no hepatomegaly  Ext: no edema Musculoskeletal:  No deformities, BUE and BLE strength normal and equal Skin: warm and dry  Neuro:  CNs 2-12 intact, no focal abnormalities noted Psych:  Normal affect   EKG:  The EKG was personally reviewed and demonstrates:  SR, HR 87, Inf Q waves noted, early R wave progression, no sig change from 07/21/2020 Telemetry:  Telemetry was personally reviewed and demonstrates:  ***  Relevant CV Studies:  ECHO: ordered  Laboratory Data:  High Sensitivity Troponin:   Recent Labs  Lab 07/22/20 0149 08/11/20 1328 08/11/20 1504  TROPONINIHS 9 1,016* 1,197*     Chemistry Recent Labs  Lab 08/11/20 1328 08/11/20 1504 08/11/20 1534  NA 123* 128* 127*  K >7.5* 4.5 4.3  CL 88* 92* 91*  CO2 24 19*  --   GLUCOSE 121* 119* 305*  BUN 11 11 10   CREATININE 1.66* 1.28* 1.10*  CALCIUM 8.8* 9.2  --   GFRNONAA 33* 46*  --   ANIONGAP 11 17*  --     Recent Labs  Lab 08/11/20 1328  PROT 7.5  ALBUMIN 3.7  AST 85*  ALT 38  ALKPHOS 82  BILITOT 1.7*   Hematology Recent Labs  Lab 08/11/20 1329 08/11/20 1534  WBC 4.6  --   RBC 5.59*  --   HGB 17.6* 16.3*  HCT 47.4* 48.0*  MCV 84.8  --   MCH 31.5  --   MCHC 37.1*  --   RDW 12.5  --   PLT 258  --    BNPNo results for input(s): BNP, PROBNP in the last 168 hours.  DDimer No results for input(s): DDIMER in the last 168 hours. Lab Results  Component Value Date    TSH 0.658 11/20/2019   No results found for: HGBA1C Lab Results  Component Value Date   TRIG 44 07/21/2020     Radiology/Studies:  DG Chest 2 View  Result Date: 08/11/2020 CLINICAL DATA:  Chest pain, weakness and vomiting since this morning. EXAM: CHEST - 2 VIEW COMPARISON:  07/23/2018 FINDINGS: Cardiac silhouette is normal in size. No mediastinal or hilar masses or evidence of adenopathy. Right anterior chest wall Port-A-Cath extends through the internal jugular vein, tip in the mid superior vena cava, stable. Lungs are hyperexpanded, but clear. No pleural effusion or pneumothorax. Skeletal structures are demineralized but intact. IMPRESSION: 1. No acute cardiopulmonary disease. Electronically Signed   By: Lajean Manes M.D.   On: 08/11/2020 14:14     Assessment and Plan:   1. ***   Risk Assessment/Risk Scores:  {Complete the following score calculators/questions to meet required metrics.  Press F2         :[id number]}   {Is the patient being seen for CHEST PAIN, UNSTABLE ANGINA, NSTEMI or STEMI?     :[id number]} {Does this patient have CHF or CHF symptoms?      :[id number]} {Does this patient have ATRIAL FIBRILLATION?:[phone]}  {Are we signing off today?:[id number]}  For questions or updates, please contact [place] Please consult [email or web address] for contact info under    Signed, Rosaria Ferries, PA-C  08/11/2020 4:48 PM

## 2020-08-11 NOTE — ED Provider Notes (Signed)
[facility] DEPT Provider Note   CSN: [id number] Arrival date & time: 08/11/20  1244     History Chief Complaint  Patient presents with  . Chest Pain    Tina Kennedy is a 70 y.o. female.  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  Patient states she was spitting up this morning and has epigastric discomfort and then had developed some coughing.  Since that time she has had pleuritic chest pain localized to her left upper quadrant.  Slight dyspnea.  Denies any hemoptysis.  Pain does not radiate to the back.  No treatment use prior to arrival        Past Medical History:  Diagnosis Date  . 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 Hyperbilirubinemia 07/21/2020  . Erythrocytosis 07/21/2020  . COVID-19 virus infection 07/21/2020  . Port-A-Cath in place 12/26/2019  . 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 Goals of care, counseling/discussion 10/31/2019  . 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 DIRECT LARYNGOSCOPY N/A 10/15/2019   Procedure: DIRECT LARYNGOSCOPY w/BIOPSY;  Surgeon: Izora Gala, MD;  Location: [place];  Service: ENT;  Laterality: N/A;  . GASTROSTOMY N/A 12/04/2019   Procedure: LAPAROSCOPIC G TUBE PLACEMENT;  Surgeon: Ralene Ok, MD;  Location[facility];  Service: General;  Laterality: N/A;  . IR GASTROSTOMY TUBE MOD SED  11/15/2019  . IR IMAGING GUIDED PORT INSERTION  11/15/2019  . IR REPLC GASTRO/COLONIC TUBE PERCUT W/FLUORO  07/20/2020  . RIGID ESOPHAGOSCOPY N/A 10/15/2019   Procedure: RIGID ESOPHAGOSCOPY;  Surgeon: Izora Gala, MD;  Location: [place];  Service: ENT;  Laterality: N/A;     OB History   No obstetric history on file.     Family History  Problem Relation Age of Onset  .  Hypertension Mother   . Hypertension Sister     Social History   Tobacco Use  . Smoking status: Current Some Day Smoker    Packs/day: 0.25    Types: Cigarettes  . Smokeless tobacco: Never Used  . Tobacco comment: Currently 1-2 cigarettes daily  Vaping Use  . Vaping Use: Never used  Substance Use Topics  . Alcohol use: Not Currently    Alcohol/week: 1.0 standard drink    Types: 1 Cans of beer per week    Comment: Occasional on weekend  . Drug use: Not Currently    Types: Marijuana    Comment: not used in 11/10/2019     Home Medications Prior to Admission medications   Medication Sig Start Date End Date Taking? Authorizing Provider  ascorbic acid (VITAMIN C) 500 MG tablet Take 1 tablet (500 mg total) by mouth daily. 07/24/20 08/23/20  [place] Eric J, DO  Multiple Vitamin (MULTIVITAMIN WITH MINERALS) TABS tablet Take 1 tablet by mouth daily. 07/24/20 10/22/20  [place] Eric J, DO  Nutritional Supplements (FEEDING SUPPLEMENT, OSMOLITE 1.5 CAL,) LIQD Give 1 carton Osmolite 1.5 via G-tube with 60 mL free water before and after each feeding 5 times daily, 3 hours apart.  This provides greater than 90% estimated needs.  1775 cal, 74.5 g protein, 1505 mL free water.  Please send supplies and formula. Patient not taking: Reported on 07/21/2020 12/10/19   Eppie [place], MD  Nutritional Supplements (FEEDING SUPPLEMENT, PROSOURCE TF,) liquid Place 45 mLs into feeding  tube 2 (two) times daily. 07/23/20 10/21/20  [place] Donnamarie Poag, DO  pantoprazole (PROTONIX) 40 MG tablet Take 1 tablet (40 mg total) by mouth 2 (two) times daily for 60 days, THEN 1 tablet (40 mg total) daily. 07/23/20 12/20/20  [place] Eric J, DO  senna-docusate (SENOKOT-S) 8.6-50 MG tablet Take 1 tablet by mouth 2 (two) times daily. 07/23/20 10/21/20  [place] Eric J, DO  zinc sulfate 220 (50 Zn) MG capsule Take 1 capsule (220 mg total) by mouth daily. 07/24/20 08/23/20  [place] Eric J, DO    Allergies    Patient has no known  allergies.  Review of Systems   Review of Systems  All other systems reviewed and are negative.   Physical Exam Updated Vital Signs BP (!) 137/107 (BP Location: Left Arm)   Pulse 92   Temp 98.1 F (36.7 C) (Oral)   Resp 18   Ht 1.651 m (5\' 5" )   Wt 44.5 kg   SpO2 99%   BMI 16.31 kg/m   Physical Exam Vitals and nursing note reviewed.  Constitutional:      General: She is not in acute distress.    Appearance: Normal appearance. She is well-developed. She is not toxic-appearing.  HENT:     Head: Normocephalic and atraumatic.  Eyes:     General: Lids are normal.     Conjunctiva/sclera: Conjunctivae normal.     Pupils: Pupils are equal, round, and reactive to light.  Neck:     Thyroid: No thyroid mass.     Trachea: No tracheal deviation.  Cardiovascular:     Rate and Rhythm: Normal rate and regular rhythm.     Heart sounds: Normal heart sounds. No murmur heard. No gallop.   Pulmonary:     Effort: Pulmonary effort is normal. No respiratory distress.     Breath sounds: Normal breath sounds. No stridor. No decreased breath sounds, wheezing, rhonchi or rales.  Abdominal:     General: Bowel sounds are normal. There is no distension.     Palpations: Abdomen is soft.     Tenderness: There is abdominal tenderness in the epigastric area. There is no rebound.    Musculoskeletal:        General: No tenderness. Normal range of motion.     Cervical back: Normal range of motion and neck supple.  Skin:    General: Skin is warm and dry.     Findings: No abrasion or rash.  Neurological:     Mental Status: She is alert and oriented to person, place, and time.     GCS: GCS eye subscore is 4. GCS verbal subscore is 5. GCS motor subscore is 6.     Cranial Nerves: No cranial nerve deficit.     Sensory: No sensory deficit.  Psychiatric:        Speech: Speech normal.        Behavior: Behavior normal.     ED Results / Procedures / Treatments   Labs (all labs ordered are listed,  but only abnormal results are displayed) Labs Reviewed  CBC  COMPREHENSIVE METABOLIC PANEL  LIPASE, BLOOD  TROPONIN I (HIGH SENSITIVITY)    EKG EKG Interpretation  Date/Time:  Tuesday Aug 11 2020 13:07:30 EDT Ventricular Rate:  87 PR Interval:  134 QRS Duration: 70 QT Interval:  376 QTC Calculation: 455 R Axis:   45 Text Interpretation: Sinus rhythm Low voltage, extremity leads Abnormal R-wave progression, early transition No significant change since last tracing Confirmed by Zenia Resides,  Elberta Fortis ([id number]) on 08/11/2020 2:15:15 PM   Radiology No results found.  Procedures Procedures   Medications Ordered in ED Medications  morphine 2 MG/ML injection 2 mg (has no administration in time range)  0.9 %  sodium chloride infusion (has no administration in time range)    ED Course  I have reviewed the triage vital signs and the nursing notes.  Pertinent labs & imaging results that were available during my care of the patient were reviewed by me and considered in my medical decision making (see chart for details).    MDM Rules/Calculators/A&P                          Patient is EKG without acute ischemic findings.  Chest x-ray without acute findings as well.  Electrolytes show evidence of hyponatremia along with hyperkalemia and some acute kidney injury.  Hyperkalemia order set started.  Discussed with Dr. Burnett Sheng from nephrology who recommends repeat potassium.  No hemolysis was noted on the original sample.  Patient also with evidence of elevated troponin.  Repeat EKG shows no evidence of acute ACS.  Discussed with Dr. Martinique from cardiology who felt that patient did not meet criteria for STEMI.  Patient started on nitro drip as well as given heparin and aspirin.  States he will follow along.  Will admit to the hospitalist service  CRITICAL CARE Performed by: Leota Jacobsen Total critical care time: 75 minutes Critical care time was exclusive of separately billable procedures and  treating other patients. Critical care was necessary to treat or prevent imminent or life-threatening deterioration. Critical care was time spent personally by me on the following activities: development of treatment plan with patient and/or surrogate as well as nursing, discussions with consultants, evaluation of patient's response to treatment, examination of patient, obtaining history from patient or surrogate, ordering and performing treatments and interventions, ordering and review of laboratory studies, ordering and review of radiographic studies, pulse oximetry and re-evaluation of patient's condition.  Final Clinical Impression(s) / ED Diagnoses Final diagnoses:  None    Rx / DC Orders ED Discharge Orders    None       Lacretia Leigh, MD 08/11/20 1521

## 2020-08-11 NOTE — Progress Notes (Signed)
  Echocardiogram 2D Echocardiogram has been performed.  Tina Kennedy 08/11/2020, 6:19 PM

## 2020-08-11 NOTE — Progress Notes (Signed)
[place] for IV heparin Indication: chest pain/ACS  No Known Allergies  Patient Measurements: Height: 5\' 5"  (165.1 cm) Weight: 44.5 kg (98 lb) IBW/kg (Calculated) : 57 Heparin Dosing Weight: TBW  Vital Signs: Temp: 98.1 F (36.7 C) (05/03 1251) Temp Source: Oral (05/03 1251) BP: 149/89 (05/03 1422) Pulse Rate: 83 (05/03 1422)  Labs: Recent Labs    08/11/20 1328 08/11/20 1329  HGB  --  17.6*  HCT  --  47.4*  PLT  --  258  CREATININE 1.66*  --   TROPONINIHS 1,016*  --     Estimated Creatinine Clearance: 22.8 mL/min (A) (by C-G formula based on SCr of 1.66 mg/dL (H)).   Medical History: Past Medical History:  Diagnosis Date  . Hypopharyngeal cancer (HCC)     Medications:  (Not in a hospital admission)  Scheduled:  . heparin  2,000 Units Intravenous Once   Assessment: 56 yoF with PMH laryngeal cancer and gastrostomy tube presents with acute chest/epigastric pain, weakness, and emesis since this AM. Also noted to have AKI and hyperkalemia. Troponins elevated, and Pharmacy consulted to dose IV heparin for ACS.   Baseline INR, aPTT: not done (no baseline anticoag)  Prior anticoagulation: none  Significant events:  Today, 08/11/2020:  CBC: hgb elevated (likely dehydrated), Plt WNL  SCr acutely elevated 70%  No bleeding or infusion issues per nursing  Goal of Therapy: Heparin level 0.3-0.7 units/ml Monitor platelets by anticoagulation protocol: Yes  Plan:  Heparin 2000 units IV bolus x 1  Heparin 550 units/hr IV infusion  Check heparin level 8 hrs after start  Daily CBC, daily heparin level once stable  Monitor for signs of bleeding or thrombosis  Reuel Boom, PharmD, BCPS [phone] 08/11/2020, 3:19 PM

## 2020-08-11 NOTE — ED Notes (Signed)
ED TO INPATIENT HANDOFF REPORT  ED Nurse Name and Phone #: [phone]  S Name/Age/Gender Tina Kennedy 70 y.o. female Room/Bed: WA07/WA07  Code Status   Code Status: Prior  Home/SNF/Other Home Patient oriented to: self, place, time and situation Is this baseline? Yes   Triage Complete: Triage complete  Chief Complaint NSTEMI (non-ST elevated myocardial infarction) [facility]) [I21.4]  Triage Note Pt presents with c/o chest pain, weakness, and vomiting since this morning.     Allergies No Known Allergies  Level of Care/Admitting Diagnosis ED Disposition    ED Disposition Condition [facility]: [place] [[id number]]  Level of Care: Telemetry Cardiac [103]  May admit patient to [facility] or [facility] if equivalent level of care is available:: No  Covid Evaluation: Asymptomatic Screening Protocol (No Symptoms)  Diagnosis: NSTEMI (non-ST elevated myocardial infarction) [facility]) [id number]  Admitting Physician: Jonnie Finner [id number]  Attending Physician: Jonnie Finner [id number]  Estimated length of stay: past midnight tomorrow  Certification:: I certify this patient will need inpatient services for at least 2 midnights       B Medical/Surgery History Past Medical History:  Diagnosis Date  . 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 DIRECT LARYNGOSCOPY N/A 10/15/2019   Procedure: DIRECT LARYNGOSCOPY w/BIOPSY;  Surgeon: Izora Gala, MD;  Location: [place];  Service: ENT;  Laterality: N/A;  . GASTROSTOMY N/A 12/04/2019   Procedure: LAPAROSCOPIC G TUBE PLACEMENT;  Surgeon: Ralene Ok, MD;  [facility];  Service: General;  Laterality: N/A;  . IR GASTROSTOMY TUBE MOD SED  11/15/2019  . IR IMAGING GUIDED PORT INSERTION  11/15/2019  . IR REPLC GASTRO/COLONIC TUBE PERCUT W/FLUORO  07/20/2020  . RIGID ESOPHAGOSCOPY N/A 10/15/2019   Procedure: RIGID ESOPHAGOSCOPY;  Surgeon: Izora Gala, MD;  Location: [facility];  Service: ENT;  Laterality: N/A;     A IV Location/Drains/Wounds Patient Lines/Drains/Airways Status    Active Line/Drains/Airways    Name Placement date Placement time Site Days   Implanted Port 11/22/19 Right Chest 11/22/19  --  Chest  263   Peripheral IV 04/08/20 Right Antecubital 04/08/20  1134  Antecubital  125   Peripheral IV 08/11/20 Anterior;Right Forearm 08/11/20  1330  Forearm  less than 1   Peripheral IV 08/11/20 Anterior;Left Forearm 08/11/20  1500  Forearm  less than 1   Peripheral IV 08/11/20 Anterior;Left Wrist 08/11/20  1538  Wrist  less than 1   Gastrostomy/Enterostomy Gastrostomy 16 Fr. RUQ 07/20/20  1121  RUQ  22   Incision (Closed) 10/15/19 N/A 10/15/19  0754  -- 301   Incision (Closed) 12/04/19 Abdomen 12/04/19  0810  -- 251          Intake/Output Last 24 hours  Intake/Output Summary (Last 24 hours) at 08/11/2020 1753 Last data filed at 08/11/2020 1625 Gross per 24 hour  Intake 1050 ml  Output --  Net 1050 ml    Labs/Imaging Results for orders placed or performed during the hospital encounter of 08/11/20 (from the past 48 hour(s))  Comprehensive metabolic panel     Status: Abnormal   Collection Time: 08/11/20  1:28 PM  Result Value Ref Range   Sodium 123 (L) 135 - 145 mmol/L   Potassium >7.5 (HH) 3.5 - 5.1 mmol/L    Comment: CRITICAL RESULT CALLED TO, READ BACK BY AND VERIFIED WITH: WEST,S. RN @1427  ON 05.03.2022 BY COHEN,K    Chloride 88 (  L) 98 - 111 mmol/L   CO2 24 22 - 32 mmol/L   Glucose, Bld 121 (H) 70 - 99 mg/dL    Comment: Glucose reference range applies only to samples taken after fasting for at least 8 hours.   BUN 11 8 - 23 mg/dL   Creatinine, Ser 1.66 (H) 0.44 - 1.00 mg/dL   Calcium 8.8 (L) 8.9 - 10.3 mg/dL   Total Protein 7.5 6.5 - 8.1 g/dL   Albumin 3.7 3.5 - 5.0 g/dL   AST 85 (H) 15 - 41 U/L    Comment: RESULTS CONFIRMED BY MANUAL DILUTION   ALT 38 0 - 44 U/L    Comment: RESULTS CONFIRMED BY MANUAL  DILUTION   Alkaline Phosphatase 82 38 - 126 U/L   Total Bilirubin 1.7 (H) 0.3 - 1.2 mg/dL   GFR, Estimated 33 (L) >60 mL/min    Comment: (NOTE) Calculated using the CKD-EPI Creatinine Equation (2021)    Anion gap 11 5 - 15    Comment: Performed at [facility], [place] [address], [place], [place] [address]  Lipase, blood     Status: None   Collection Time: 08/11/20  1:28 PM  Result Value Ref Range   Lipase 40 11 - 51 U/L    Comment: Performed at [facility], [place] [address], [place], [place] [address]  Troponin I (High Sensitivity)     Status: Abnormal   Collection Time: 08/11/20  1:28 PM  Result Value Ref Range   Troponin I (High Sensitivity) 1,016 (HH) <18 ng/L    Comment: CRITICAL RESULT CALLED TO, READ BACK BY AND VERIFIED WITH: WEST,S. RN @1427  ON 05.03.2022 BY COHEN,K (NOTE) Elevated high sensitivity troponin I (hsTnI) values and significant  changes across serial measurements may suggest ACS but many other  chronic and acute conditions are known to elevate hsTnI results.  Refer to the Links section for chest pain algorithms and additional  guidance. Performed at [facility], [place] [address] [place], [place] [address]   CBC     Status: Abnormal   Collection Time: 08/11/20  1:29 PM  Result Value Ref Range   WBC 4.6 4.0 - 10.5 K/uL   RBC 5.59 (H) 3.87 - 5.11 MIL/uL   Hemoglobin 17.6 (H) 12.0 - 15.0 g/dL   HCT 47.4 (H) 36.0 - 46.0 %   MCV 84.8 80.0 - 100.0 fL   MCH 31.5 26.0 - 34.0 pg   MCHC 37.1 (H) 30.0 - 36.0 g/dL   RDW 12.5 11.5 - 15.5 %   Platelets 258 150 - 400 K/uL   nRBC 0.0 0.0 - 0.2 %    Comment: Performed at [facility], [place] [address], [place], [place] [address]  Troponin I (High Sensitivity)     Status: Abnormal   Collection Time: 08/11/20  3:04 PM  Result Value Ref Range   Troponin I (High Sensitivity) 1,197 (HH) <18 ng/L    Comment: DELTA CHECK NOTED CRITICAL RESULT CALLED TO,  READ BACK BY AND VERIFIED WITH: BOWEN,M. RN @1614  ON 05.03.2022 BY COHEN,K (NOTE) Elevated high sensitivity troponin I (hsTnI) values and significant  changes across serial measurements may suggest ACS but many other  chronic and acute conditions are known to elevate hsTnI results.  Refer to the Links section for chest pain algorithms and additional  guidance. Performed at [facility], [place] [address], [place],  [address]   Basic metabolic panel     Status: Abnormal  Collection Time: 08/11/20  3:04 PM  Result Value Ref Range   Sodium 128 (L) 135 - 145 mmol/L   Potassium 4.5 3.5 - 5.1 mmol/L    Comment: DELTA CHECK NOTED   Chloride 92 (L) 98 - 111 mmol/L   CO2 19 (L) 22 - 32 mmol/L   Glucose, Bld 119 (H) 70 - 99 mg/dL    Comment: Glucose reference range applies only to samples taken after fasting for at least 8 hours.   BUN 11 8 - 23 mg/dL   Creatinine, Ser 1.28 (H) 0.44 - 1.00 mg/dL   Calcium 9.2 8.9 - 10.3 mg/dL   GFR, Estimated 46 (L) >60 mL/min    Comment: (NOTE) Calculated using the CKD-EPI Creatinine Equation (2021)    Anion gap 17 (H) 5 - 15    Comment: Performed at [facility], [place] [address], [place], [place] [address]  I-stat chem 8, ED (not at [facility] or [facility])     Status: Abnormal   Collection Time: 08/11/20  3:34 PM  Result Value Ref Range   Sodium 127 (L) 135 - 145 mmol/L   Potassium 4.3 3.5 - 5.1 mmol/L   Chloride 91 (L) 98 - 111 mmol/L   BUN 10 8 - 23 mg/dL   Creatinine, Ser 1.10 (H) 0.44 - 1.00 mg/dL   Glucose, Bld 305 (H) 70 - 99 mg/dL    Comment: Glucose reference range applies only to samples taken after fasting for at least 8 hours.   Calcium, Ion 1.00 (L) 1.15 - 1.40 mmol/L   TCO2 21 (L) 22 - 32 mmol/L   Hemoglobin 16.3 (H) 12.0 - 15.0 g/dL   HCT 48.0 (H) 36.0 - 46.0 %  CBG monitoring, ED     Status: Abnormal   Collection Time: 08/11/20  4:50 PM  Result Value Ref Range   Glucose-Capillary 116 (H) 70 - 99  mg/dL    Comment: Glucose reference range applies only to samples taken after fasting for at least 8 hours.   DG Chest 2 View  Result Date: 08/11/2020 CLINICAL DATA:  Chest pain, weakness and vomiting since this morning. EXAM: CHEST - 2 VIEW COMPARISON:  07/23/2018 FINDINGS: Cardiac silhouette is normal in size. No mediastinal or hilar masses or evidence of adenopathy. Right anterior chest wall Port-A-Cath extends through the internal jugular vein, tip in the mid superior vena cava, stable. Lungs are hyperexpanded, but clear. No pleural effusion or pneumothorax. Skeletal structures are demineralized but intact. IMPRESSION: 1. No acute cardiopulmonary disease. Electronically Signed   By: Lajean Manes M.D.   On: 08/11/2020 14:14    Pending Labs Unresulted Labs (From admission, onward)          Start     Ordered   08/12/20 0500  CBC  Daily,   R      08/11/20 1518   08/12/20 0000  Heparin level (unfractionated)  Once-Timed,   STAT        08/11/20 1518   08/11/20 1441  Potassium  Now then every 4 hours,   STAT     Comments: Until normal twice.    08/11/20 1442   Signed and Held  HIV Antibody (routine testing w rflx)  (HIV Antibody (Routine testing w reflex) panel)  Once,   R        Signed and Held   Signed and Held  Lipid panel  Once,   R        Signed and Held   Signed  and Held  Lactic acid, plasma  STAT Now then every 3 hours,   R      Signed and Held   Signed and Held  Sodium, urine, random  Once,   R        Signed and Held   Signed and Held  Osmolality, urine  Once,   R        Signed and Held          Vitals/Pain Today's Vitals   08/11/20 1422 08/11/20 1530 08/11/20 1700 08/11/20 1718  BP: (!) 149/89 134/73 (!) 148/92   Pulse: 83 90 93   Resp: (!) 25 (!) 29 (!) 22   Temp:      TempSrc:      SpO2: 100% 100% 100%   Weight: 44.5 kg     Height: 5\' 5"  (1.651 m)     PainSc:    5     Isolation Precautions No active isolations  Medications Medications  0.9 %  sodium  chloride infusion ( Intravenous New Bag/Given 08/11/20 1335)  nitroGLYCERIN 50 mg in dextrose 5 % 250 mL (0.2 mg/mL) infusion (10 mcg/min Intravenous Rate/Dose Change 08/11/20 1633)  lactated ringers infusion ( Intravenous New Bag/Given 08/11/20 1629)  heparin ADULT infusion 100 units/mL (25000 units/214mL) (550 Units/hr Intravenous New Bag/Given 08/11/20 1635)  morphine 2 MG/ML injection 2 mg (2 mg Intravenous Given 08/11/20 1334)  insulin aspart (novoLOG) injection 10 Units (10 Units Intravenous Given 08/11/20 1510)    And  dextrose 50 % solution 50 mL (50 mLs Intravenous Given 08/11/20 1511)  aspirin chewable tablet 324 mg (324 mg Oral Given 08/11/20 1508)  calcium gluconate 1 g/ 50 mL sodium chloride IVPB (0 g Intravenous Stopped 08/11/20 1625)  lactated ringers bolus 1,000 mL (0 mLs Intravenous Stopped 08/11/20 1602)  heparin bolus via infusion 2,000 Units (2,000 Units Intravenous Bolus from Bag 08/11/20 1636)  ondansetron (ZOFRAN) injection 4 mg (4 mg Intravenous Given 08/11/20 1558)    Mobility walks with device Low fall risk   Focused Assessments .   R Recommendations: See Admitting Provider Note  Report given to:   Additional Notes: n/a

## 2020-08-12 ENCOUNTER — Inpatient Hospital Stay (HOSPITAL_COMMUNITY): Payer: Medicare Other

## 2020-08-12 ENCOUNTER — Other Ambulatory Visit: Payer: Self-pay

## 2020-08-12 DIAGNOSIS — R109 Unspecified abdominal pain: Secondary | ICD-10-CM | POA: Diagnosis present

## 2020-08-12 DIAGNOSIS — N179 Acute kidney failure, unspecified: Secondary | ICD-10-CM

## 2020-08-12 DIAGNOSIS — E871 Hypo-osmolality and hyponatremia: Secondary | ICD-10-CM

## 2020-08-12 DIAGNOSIS — E876 Hypokalemia: Secondary | ICD-10-CM

## 2020-08-12 DIAGNOSIS — I214 Non-ST elevation (NSTEMI) myocardial infarction: Principal | ICD-10-CM

## 2020-08-12 DIAGNOSIS — E43 Unspecified severe protein-calorie malnutrition: Secondary | ICD-10-CM | POA: Insufficient documentation

## 2020-08-12 DIAGNOSIS — C139 Malignant neoplasm of hypopharynx, unspecified: Secondary | ICD-10-CM

## 2020-08-12 LAB — CBC
HCT: 44.5 % (ref 36.0–46.0)
Hemoglobin: 16.2 g/dL — ABNORMAL HIGH (ref 12.0–15.0)
MCH: 31.3 pg (ref 26.0–34.0)
MCHC: 36.4 g/dL — ABNORMAL HIGH (ref 30.0–36.0)
MCV: 85.9 fL (ref 80.0–100.0)
Platelets: 259 10*3/uL (ref 150–400)
RBC: 5.18 MIL/uL — ABNORMAL HIGH (ref 3.87–5.11)
RDW: 12.3 % (ref 11.5–15.5)
WBC: 6.1 10*3/uL (ref 4.0–10.5)
nRBC: 0 % (ref 0.0–0.2)

## 2020-08-12 LAB — LACTIC ACID, PLASMA
Lactic Acid, Venous: 1.7 mmol/L (ref 0.5–1.9)
Lactic Acid, Venous: 1.8 mmol/L (ref 0.5–1.9)

## 2020-08-12 LAB — HEPARIN LEVEL (UNFRACTIONATED)
Heparin Unfractionated: <0.1 IU/mL — ABNORMAL LOW (ref 0.30–0.70)
Heparin Unfractionated: 0.17 IU/mL — ABNORMAL LOW (ref 0.30–0.70)
Heparin Unfractionated: 0.85 IU/mL — ABNORMAL HIGH (ref 0.30–0.70)

## 2020-08-12 LAB — BASIC METABOLIC PANEL
Anion gap: 10 (ref 5–15)
BUN: 10 mg/dL (ref 8–23)
CO2: 25 mmol/L (ref 22–32)
Calcium: 8.6 mg/dL — ABNORMAL LOW (ref 8.9–10.3)
Chloride: 96 mmol/L — ABNORMAL LOW (ref 98–111)
Creatinine, Ser: 1.09 mg/dL — ABNORMAL HIGH (ref 0.44–1.00)
GFR, Estimated: 55 mL/min — ABNORMAL LOW (ref >60)
Glucose, Bld: 105 mg/dL — ABNORMAL HIGH (ref 70–99)
Potassium: 4.2 mmol/L (ref 3.5–5.1)
Sodium: 131 mmol/L — ABNORMAL LOW (ref 135–145)

## 2020-08-12 LAB — TROPONIN I (HIGH SENSITIVITY)
Troponin I (High Sensitivity): 1454 ng/L (ref <18)
Troponin I (High Sensitivity): 1805 ng/L (ref <18)
Troponin I (High Sensitivity): 1463 ng/L (ref <18)

## 2020-08-12 LAB — HIV ANTIBODY (ROUTINE TESTING W REFLEX): HIV Screen 4th Generation wRfx: NONREACTIVE

## 2020-08-12 LAB — LIPID PANEL
Cholesterol: 187 mg/dL (ref 0–200)
HDL: 113 mg/dL (ref >40)
LDL Cholesterol: 64 mg/dL (ref 0–99)
Total CHOL/HDL Ratio: 1.7 RATIO
Triglycerides: 50 mg/dL (ref <150)
VLDL: 10 mg/dL (ref 0–40)

## 2020-08-12 LAB — POTASSIUM
Potassium: 3.5 mmol/L (ref 3.5–5.1)
Potassium: 3.6 mmol/L (ref 3.5–5.1)
Potassium: 3.3 mmol/L — ABNORMAL LOW (ref 3.5–5.1)
Potassium: 3.1 mmol/L — ABNORMAL LOW (ref 3.5–5.1)
Potassium: 2.9 mmol/L — ABNORMAL LOW (ref 3.5–5.1)
Potassium: 3 mmol/L — ABNORMAL LOW (ref 3.5–5.1)
Potassium: 3 mmol/L — ABNORMAL LOW (ref 3.5–5.1)

## 2020-08-12 LAB — HEPATIC FUNCTION PANEL
ALT: 11 U/L (ref 0–44)
AST: 31 U/L (ref 15–41)
Albumin: 2.8 g/dL — ABNORMAL LOW (ref 3.5–5.0)
Alkaline Phosphatase: 71 U/L (ref 38–126)
Bilirubin, Direct: 0.3 mg/dL — ABNORMAL HIGH (ref 0.0–0.2)
Indirect Bilirubin: 0.6 mg/dL (ref 0.3–0.9)
Total Bilirubin: 0.9 mg/dL (ref 0.3–1.2)
Total Protein: 6.1 g/dL — ABNORMAL LOW (ref 6.5–8.1)

## 2020-08-12 MED ORDER — OXYCODONE-ACETAMINOPHEN 5-325 MG PO TABS: 1.0000 | ORAL_TABLET | ORAL | Status: DC | PRN

## 2020-08-12 MED ORDER — IOHEXOL 300 MG/ML  SOLN
75.0000 mL | Freq: Once | INTRAMUSCULAR | Status: AC | PRN
  Administered 2020-08-12: 75 mL via INTRAVENOUS

## 2020-08-12 MED ORDER — ENSURE ENLIVE PO LIQD: 237.0000 mL | Freq: Two times a day (BID) | ORAL | Status: DC

## 2020-08-12 MED ORDER — HYDROMORPHONE HCL 1 MG/ML IJ SOLN
1.0000 mg | INTRAMUSCULAR | Status: DC | PRN
  Administered 2020-08-12: 1 mg via INTRAVENOUS
  Filled 2020-08-12: qty 1

## 2020-08-12 MED ORDER — ENSURE ENLIVE PO LIQD
237.0000 mL | Freq: Three times a day (TID) | ORAL | Status: DC
  Administered 2020-08-14: 237 mL via ORAL

## 2020-08-12 MED ORDER — HEPARIN BOLUS VIA INFUSION
1000.0000 [IU] | Freq: Once | INTRAVENOUS | Status: AC
  Administered 2020-08-12: 1000 [IU] via INTRAVENOUS
  Filled 2020-08-12: qty 1000

## 2020-08-12 MED ORDER — IOHEXOL 9 MG/ML PO SOLN
ORAL | Status: AC
  Filled 2020-08-12: qty 1000

## 2020-08-12 MED ORDER — ADULT MULTIVITAMIN W/MINERALS CH
1.0000 | ORAL_TABLET | Freq: Every day | ORAL | Status: DC
  Administered 2020-08-12 – 2020-08-14 (×3): 1 via ORAL
  Filled 2020-08-12 (×3): qty 1

## 2020-08-12 MED ORDER — TRAZODONE HCL 50 MG PO TABS: 50.0000 mg | ORAL_TABLET | Freq: Every evening | ORAL | Status: DC | PRN

## 2020-08-12 NOTE — Plan of Care (Signed)

## 2020-08-12 NOTE — Progress Notes (Addendum)
Per care order, paged Cardiology to let them know patient had arrived from [facility]. Per MD [place] patient was being admitted by [facility] and to paged. Notified [facility], patient stable at the moment.

## 2020-08-12 NOTE — Progress Notes (Signed)
ANTICOAGULATION CONSULT NOTE - Follow Up Consult  Pharmacy Consult for heparin Indication: NSTEMI  Labs: Recent Labs    08/11/20 1328 08/11/20 1329 08/11/20 1504 08/11/20 1534 08/11/20 2323 08/12/20 0003  HGB  --  17.6*  --  16.3*  --   --   HCT  --  47.4*  --  48.0*  --   --   PLT  --  258  --   --   --   --   HEPARINUNFRC  --   --   --   --   --  <0.10*  CREATININE 1.66*  --  1.28* 1.10*  --   --   TROPONINIHS 1,016*  --  1,197*  --  1,454*  --     Assessment: 70yo female subtherapeutic on heparin with initial dosing for NSTEMI; no gtt issues or signs of bleeding per RN.  Goal of Therapy:  Heparin level 0.3-0.7 units/ml   Plan:  Will rebolus with heparin 1000 units and increase heparin gtt by 4 units/kg/hr to 700 units/hr and check level in 8 hours.    Wynona Neat, PharmD, BCPS  08/12/2020,1:33 AM

## 2020-08-12 NOTE — Progress Notes (Signed)
Initial Nutrition Assessment  DOCUMENTATION CODES:   Underweight,Severe malnutrition in context of chronic illness  INTERVENTION:   -Increase Ensure Enlive po to TD, each supplement provides 350 kcal and 20 grams of protein -Magic cup TID with meals, each supplement provides 290 kcal and 9 grams of protein -MVI with minerals daily -Advance to dysphagia 3 (advanced mechanical soft) diet per discussion with Dr. 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  GOAL:   Patient will meet greater than or equal to 90% of their needs  MONITOR:   PO intake,Supplement acceptance,Diet advancement,Labs,Weight trends,Skin,I & O's  REASON FOR ASSESSMENT:   Consult Assessment of nutrition requirement/status  ASSESSMENT:   Tina Kennedy is a 70 y.o. 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 Presents with chest pain and cough. She was found to have elevated troponins and cardiology consulted.   EKG does not show any  St t wave changes. CXR show no active pulmonary disease.  Pt admitted with NSTEMI and chest pain.   Reviewed I/O's: +600 ml x 24 hours  UOP: 450 ml x 24 hours  Spoke with pt at bedside, who reports that her throat hurts when she tries to talk, so RD asked mostly close ended questions. Pt shares that she used to use PEG for nutrition, however, this recently fell out. Per her report, she has an MD appointment at the end of the month to determine if PEG needs to be replaced. In the meantime, pt has been consuming an oral diet, however, reports a lot of tougher texture foods, especially meats, are difficulty for her to swallow.  Pt generally consumes 3 meals per day (Breakfast: oatmeal and scrambled eggs; Dinner: spaghetti).   Pt endorses "a lot" of weight loss, but is unsure of UBW or how much weight she has lost. Reviewed wt hx; pt has experienced a 9.6% wt loss over the past month, which is significant for time frame.   Case discussed with Dr. Karleen Hampshire and informed her of above findings. Plan to advance diet (dysphagia 3).   Discussed with pt importance of good meal and supplement intake. She is amenable to Ensure Enlive supplements.  Medications reviewed and include 0.9% sodium chloride infusion @ 125 ml/hr.    Labs reviewed: K: 3.1. Na: 131.   NUTRITION - FOCUSED PHYSICAL EXAM:  Flowsheet Row Most Recent Value  Orbital Region Moderate depletion  Upper Arm Region Severe depletion  Thoracic and Lumbar Region Severe depletion  Buccal Region Moderate depletion  Temple Region Moderate depletion  Clavicle Bone Region Severe depletion  Clavicle and Acromion Bone Region Severe depletion  Scapular Bone Region Severe depletion  Dorsal Hand Moderate depletion  Patellar Region Severe depletion  Anterior Thigh Region Severe depletion  Posterior Calf Region Severe depletion  Edema (RD Assessment) None  Hair Reviewed  Eyes Reviewed  Mouth Reviewed  Skin Reviewed  Nails Reviewed       Diet Order:   Diet Order            DIET DYS 3 Room service appropriate? Yes with Assist; Fluid consistency: Thin  Diet effective now                 EDUCATION NEEDS:   Education  needs have been addressed  Skin:  Skin Assessment: Reviewed RN Assessment  Last BM:  08/11/20  Height:   Ht Readings from Last 1 Encounters:  08/11/20 5\' 5"  (1.651 m)    Weight:   Wt Readings from Last 1 Encounters:  08/12/20 40.2 kg    Ideal Body Weight:  56.8 kg  BMI:  Body mass index is 14.74 kg/m.  Estimated Nutritional Needs:   Kcal:  1600-1800  Protein:  80-95 grams  Fluid:  > 1.6 L    Loistine Chance, RD, LDN,  [place] Registered Dietitian II Certified Diabetes Care and Education Specialist Please refer to [facility] for RD and/or RD on-call/weekend/after hours pager

## 2020-08-12 NOTE — Progress Notes (Signed)
[place] for heparin Indication: NSTEMI  Labs: Recent Labs    08/11/20 1329 08/11/20 1504 08/11/20 1534 08/11/20 2323 08/12/20 0003 08/12/20 0248 08/12/20 0523 08/12/20 0755 08/12/20 2250  HGB 17.6*  --  16.3*  --   --  16.2*  --   --   --   HCT 47.4*  --  48.0*  --   --  44.5  --   --   --   PLT 258  --   --   --   --  259  --   --   --   HEPARINUNFRC  --   --   --   --  <0.10*  --   --  0.17* 0.85*  CREATININE  --  1.28* 1.10*  --   --   --   --  1.09*  --   TROPONINIHS  --  1,197*  --  1,454*  --  1,805* 1,463*  --   --     Assessment: 70 y.o. female with NSTEMI for heparin  Goal of Therapy:  Heparin level 0.3-0.7 units/ml   Plan:  Decrease Heparin 800 units/hr Follow-up am labs.  Phillis Knack, PharmD, BCPS  08/12/2020 11:47 PM

## 2020-08-12 NOTE — Progress Notes (Signed)
ANTICOAGULATION CONSULT NOTE - Follow Up Consult  Pharmacy Consult for heparin Indication: NSTEMI  Labs: Recent Labs    08/11/20 1329 08/11/20 1504 08/11/20 1534 08/11/20 2323 08/12/20 0003 08/12/20 0248 08/12/20 0523 08/12/20 0755  HGB 17.6*  --  16.3*  --   --  16.2*  --   --   HCT 47.4*  --  48.0*  --   --  44.5  --   --   PLT 258  --   --   --   --  259  --   --   HEPARINUNFRC  --   --   --   --  <0.10*  --   --  0.17*  CREATININE  --  1.28* 1.10*  --   --   --   --  1.09*  TROPONINIHS  --  1,197*  --  1,454*  --  1,805* 1,463*  --     Assessment: 71yo female subtherapeutic on heparin with initial dosing for NSTEMI; no gtt issues or signs of bleeding per RN. Heparin drip 700 uts/hr HL 0.17  Will increase slightly then follow up after cath   Goal of Therapy:  Heparin level 0.3-0.7 units/ml   Plan:  Increase heparin drip 900 uts/hr  Bonnita Nasuti Pharm.D. CPP, BCPS Clinical Pharmacist [phone] 08/12/2020 11:30 AM

## 2020-08-12 NOTE — Progress Notes (Signed)
PROGRESS NOTE    Tina Kennedy  OMV:[id number] DOB: Jul 21, 1950 DOA: 08/11/2020 PCP: Patient, No Pcp Per (Inactive)    Chief Complaint  Patient presents with  . Chest Pain    Brief Narrative:   Tina Kennedy is a 70 y.o. 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 Presents with chest pain and cough. She was found to have elevated troponins and cardiology consulted.  EKG does not show any  St t wave changes. CXR show no active pulmonary disease.  Pt seen and examined at bedside.     Assessment & Plan:   Active Problems:   Hypopharyngeal cancer (HCC)   NSTEMI (non-ST elevated myocardial infarction) (HCC)   AKI (acute kidney injury) ([place])   Hyponatremia   Hypokalemia   Abdominal pain  Abdominal pain with some nausea:  Unclear etiology. Differential include gastritis. No diarrhea or vomiting. Generalized / epigastric abd pain.  Elevated AST and bilirubin. Rpt liver enzymes, lipase within normal limits. Patient afebrile and WBC count within normal limits. Lactic acid wnl.  She was started on IV PPI.  Get CT abd and pelvis for further evaluation.   AKI:  Probably pre renal , resolved with IV fluids. Get UA.     Hypokalemia:  Replaced.  respeat in am and check magnesium   NSTEMI;  Demand ischemia/ leading to elevated troponin. Cardiology on board and is recommended 24 to 48 hours of IV heparin.  Lipid panel reviewed.  Echocardiogram Left ventricular ejection fraction, by estimation, is 60 to 65%, with  normal function. Left ventricular endocardial border  not optimally defined to evaluate regional wall motion though no wall  motion abnormalities seen in  foreshortened views. Left ventricular diastolic parameters are consistent  with Grade I diastolic dysfunction (impaired relaxation.   Chronic hyponatremia:  Sodium has improved to 131.    DVT prophylaxis: Heparin Code Status: FULL CODE.  Family Communication: none at bedside.  Disposition:   Status is: Inpatient  Remains inpatient appropriate because:IV treatments appropriate due to intensity of illness or inability to take PO   Dispo: The patient is from: Home              Anticipated d/c is to: pending.               Patient currently is not medically stable to d/c.   Difficult to place patient No       Consultants:   Cardiology.    Procedures: Echocardiogram.    Antimicrobials: none.    Subjective: Pt reports abd pain.   Objective: Vitals:   08/11/20 2255 08/12/20 0506 08/12/20 0526 08/12/20 0809  BP: (!) 164/101 (!) 156/96 (!) 144/96 (!) 160/103  Pulse: 90 100  90  Resp: 18 15  17   Temp: 98.3 F (36.8 C) 98.3 F (36.8 C)  99 F (37.2 C)  TempSrc: Oral Oral  Oral  SpO2: 95% 97%  100%  Weight: 42 kg 40.2 kg    Height: 5\' 5"  (1.651 m)       Intake/Output Summary (Last 24 hours) at 08/12/2020 1220 Last data filed at 08/12/2020 0513 Gross per 24 hour  Intake 1050 ml  Output 450 ml  Net 600 ml   Filed Weights   08/11/20 1422 08/11/20 2255 08/12/20 0506  Weight: 44.5 kg 42 kg 40.2 kg    Examination:  General exam: Appears calm and comfortable  Respiratory system: Clear  to auscultation. Respiratory effort normal. Cardiovascular system: S1 & S2 heard, RRR. Marland Kitchen No pedal edema. Gastrointestinal system: Abdomen is soft, generalized tenderness,  non distended, no signs of peritonitis. Bowel sounds wnl.  Central nervous system: Alert and oriented. No focal neurological deficits. Extremities: no pedal edema.  Skin: No rashes seen Psychiatry:  Mood & affect appropriate.     Data Reviewed: I have personally reviewed following labs and imaging studies  CBC: Recent Labs  Lab 08/11/20 1329 08/11/20 1534 08/12/20 0248  WBC 4.6  --  6.1  HGB 17.6* 16.3* 16.2*  HCT 47.4* 48.0* 44.5  MCV 84.8  --  85.9  PLT 258  --  259    Basic  Metabolic Panel: Recent Labs  Lab 08/11/20 1328 08/11/20 1504 08/11/20 1534 08/11/20 2323 08/12/20 0248 08/12/20 0611 08/12/20 0755 08/12/20 1024  NA 123* 128* 127*  --   --   --  131*  --   K >7.5* 4.5 4.3 3.5 3.6 3.3* 4.2 3.1*  CL 88* 92* 91*  --   --   --  96*  --   CO2 24 19*  --   --   --   --  25  --   GLUCOSE 121* 119* 305*  --   --   --  105*  --   BUN 11 11 10   --   --   --  10  --   CREATININE 1.66* 1.28* 1.10*  --   --   --  1.09*  --   CALCIUM 8.8* 9.2  --   --   --   --  8.6*  --     GFR: Estimated Creatinine Clearance: 31.3 mL/min (A) (by C-G formula based on SCr of 1.09 mg/dL (H)).  Liver Function Tests: Recent Labs  Lab 08/11/20 1328  AST 85*  ALT 38  ALKPHOS 82  BILITOT 1.7*  PROT 7.5  ALBUMIN 3.7    CBG: Recent Labs  Lab 08/11/20 1650  GLUCAP 116*     No results found for this or any previous visit (from the past 240 hour(s)).       Radiology Studies: DG Chest 2 View  Result Date: 08/11/2020 CLINICAL DATA:  Chest pain, weakness and vomiting since this morning. EXAM: CHEST - 2 VIEW COMPARISON:  07/23/2018 FINDINGS: Cardiac silhouette is normal in size. No mediastinal or hilar masses or evidence of adenopathy. Right anterior chest wall Port-A-Cath extends through the internal jugular vein, tip in the mid superior vena cava, stable. Lungs are hyperexpanded, but clear. No pleural effusion or pneumothorax. Skeletal structures are demineralized but intact. IMPRESSION: 1. No acute cardiopulmonary disease. Electronically Signed   By: Lajean Manes M.D.   On: 08/11/2020 14:14   ECHOCARDIOGRAM COMPLETE  Result Date: 08/11/2020    ECHOCARDIOGRAM REPORT   Patient Name:   Tina TAVANO [facility] Kennedy Date of Exam: 08/11/2020 Medical Rec #:  [id number]                      Height:       65.0 in Accession #:    [id number]                     Weight:       98.0 lb Date of Birth:  23-Jun-1950                      BSA:  1.461 m Patient Age:    27 years                        BP:           147/91 mmHg Patient Gender: F                              HR:           93 bpm. Exam Location:  Inpatient Procedure: 2D Echo STAT ECHO Indications:    NSTEMI  History:        Patient has no prior history of Echocardiogram examinations.  Sonographer:    Johny Chess Referring Phys: [id number] TYRONE A KYLE  Sonographer Comments: Technically difficult study due to poor echo windows. Image acquisition challenging due to respiratory motion. IMPRESSIONS  1. Left ventricular ejection fraction, by estimation, is 60 to 65%. The left ventricle has normal function. Left ventricular endocardial border not optimally defined to evaluate regional wall motion though no wall motion abnormalities seen in foreshortened views. Left ventricular diastolic parameters are consistent with Grade I diastolic dysfunction (impaired relaxation).  2. Right ventricular systolic function is normal. The right ventricular size is normal.  3. The mitral valve is grossly normal. Trivial mitral valve regurgitation. No evidence of mitral stenosis.  4. The aortic valve is grossly normal. Aortic valve regurgitation is not visualized. No aortic stenosis is present.  5. The inferior vena cava is normal in size with greater than 50% respiratory variability, suggesting right atrial pressure of 3 mmHg. Comparison(s): No prior Echocardiogram. FINDINGS  Left Ventricle: Left ventricular ejection fraction, by estimation, is 60 to 65%. The left ventricle has normal function. Left ventricular endocardial border not optimally defined to evaluate regional wall motion. The left ventricular internal cavity size was small. There is no left ventricular hypertrophy. Left ventricular diastolic parameters are consistent with Grade I diastolic dysfunction (impaired relaxation). Right Ventricle: The right ventricular size is normal. No increase in right ventricular wall thickness. Right ventricular systolic function is normal. Left Atrium:  Left atrial size was normal in size. Right Atrium: Right atrial size was not well visualized. Pericardium: Trivial pericardial effusion is present. Mitral Valve: The mitral valve is grossly normal. Trivial mitral valve regurgitation. No evidence of mitral valve stenosis. Tricuspid Valve: The tricuspid valve is grossly normal. Tricuspid valve regurgitation is not demonstrated. Aortic Valve: The aortic valve is grossly normal. Aortic valve regurgitation is not visualized. No aortic stenosis is present. Pulmonic Valve: The pulmonic valve was not well visualized. Pulmonic valve regurgitation is not visualized. No evidence of pulmonic stenosis. Aorta: The aortic root and ascending aorta are structurally normal, with no evidence of dilitation. Venous: The inferior vena cava is normal in size with greater than 50% respiratory variability, suggesting right atrial pressure of 3 mmHg. IAS/Shunts: The atrial septum is grossly normal.  LEFT VENTRICLE PLAX 2D LVIDd:         3.70 cm  Diastology LVIDs:         2.45 cm  LV e' medial:    5.66 cm/s LV PW:         0.80 cm  LV E/e' medial:  9.2 LV IVS:        0.80 cm  LV e' lateral:   5.44 cm/s LVOT diam:     1.60 cm  LV E/e' lateral: 9.5 LV SV:  48 LV SV Index:   33 LVOT Area:     2.01 cm  RIGHT VENTRICLE             IVC RV S prime:     13.10 cm/s  IVC diam: 1.00 cm LEFT ATRIUM           Index LA diam:      2.30 cm 1.57 cm/m LA Vol (A4C): 18.7 ml 12.80 ml/m  AORTIC VALVE LVOT Vmax:   104.00 cm/s LVOT Vmean:  72.300 cm/s LVOT VTI:    0.238 m  AORTA Ao Root diam: 2.60 cm Ao Asc diam:  3.00 cm MITRAL VALVE MV Area (PHT): 1.81 cm     SHUNTS MV Decel Time: 419 msec     Systemic VTI:  0.24 m MV E velocity: 51.85 cm/s   Systemic Diam: 1.60 cm MV A velocity: 103.00 cm/s MV E/A ratio:  0.50 Rudean Haskell MD Electronically signed by Rudean Haskell MD Signature Date/Time: 08/11/2020/7:37:33 PM    Final         Scheduled Meds: . feeding supplement  237 mL Oral BID BM   . pantoprazole (PROTONIX) IV  40 mg Intravenous Q24H   Continuous Infusions: . sodium chloride 20 mL/hr at 08/11/20 1335  . sodium chloride 125 mL/hr at 08/12/20 0807  . heparin 900 Units/hr (08/12/20 1215)  . nitroGLYCERIN 4.5 mcg/min (08/11/20 2249)     LOS: 1 day        Hosie Poisson, MD [facility]   To contact the attending provider between 7A-7P or the covering provider during after hours 7P-7A, please log into the web site [email or web address] and access using universal [place] password for that web site. If you do not have the password, please call the hospital operator.  08/12/2020, 12:20 PM

## 2020-08-12 NOTE — Progress Notes (Addendum)
Progress Note  Patient Name: Tina Kennedy Date of Encounter: 08/12/2020  [place] Cardiologist: None new  Subjective   Noted her chest pain resolved, on IV NTG but still with abd pain.  Her peg fell out 2 weeks ago.  She had COVID 1.5 months ago or so and she had similar symptoms.    Inpatient Medications    Scheduled Meds: . feeding supplement  237 mL Oral BID BM  . pantoprazole (PROTONIX) IV  40 mg Intravenous Q24H   Continuous Infusions: . sodium chloride 20 mL/hr at 08/11/20 1335  . sodium chloride 125 mL/hr at 08/11/20 2339  . heparin 700 Units/hr (08/12/20 0149)  . nitroGLYCERIN 4.5 mcg/min (08/11/20 2249)   PRN Meds: acetaminophen, LORazepam, morphine injection, ondansetron (ZOFRAN) IV   Vital Signs    Vitals:   08/11/20 2030 08/11/20 2100 08/11/20 2255 08/12/20 0506  BP: (!) 150/95 (!) 171/96 (!) 164/101 (!) 156/96  Pulse: 87 90 90 100  Resp: 16 (!) 22 18 15   Temp:   98.3 F (36.8 C) 98.3 F (36.8 C)  TempSrc:   Oral Oral  SpO2: 100% 100% 95% 97%  Weight:   42 kg 40.2 kg  Height:   5\' 5"  (1.651 m)     Intake/Output Summary (Last 24 hours) at 08/12/2020 0647 Last data filed at 08/12/2020 0513 Gross per 24 hour  Intake 1050 ml  Output 450 ml  Net 600 ml   Last 3 Weights 08/12/2020 08/11/2020 08/11/2020  Weight (lbs) 88 lb 9.6 oz 92 lb 9.5 oz 98 lb  Weight (kg) 40.189 kg 42 kg 44.453 kg      Telemetry    SR - Personally Reviewed  ECG    No new - Personally Reviewed  Physical Exam   GEN: No acute distress.  But continues with abd pain. Neck: No JVD Cardiac: RRR, no murmurs, rubs, or gallops.  Respiratory: Clear to auscultation bilaterally. GI: Soft, +tenderness diffuse, non-distended  MS: No edema; No deformity. Neuro:  Nonfocal  Psych: Normal affect   Labs    High Sensitivity Troponin:   Recent Labs  Lab 08/11/20 1328 08/11/20 1504 08/11/20 2323 08/12/20 0248 08/12/20 0523  TROPONINIHS 1,016* 1,197* 1,454* 1,805*  1,463*      Chemistry Recent Labs  Lab 08/11/20 1328 08/11/20 1504 08/11/20 1534 08/11/20 2323 08/12/20 0248  NA 123* 128* 127*  --   --   K >7.5* 4.5 4.3 3.5 3.6  CL 88* 92* 91*  --   --   CO2 24 19*  --   --   --   GLUCOSE 121* 119* 305*  --   --   BUN 11 11 10   --   --   CREATININE 1.66* 1.28* 1.10*  --   --   CALCIUM 8.8* 9.2  --   --   --   PROT 7.5  --   --   --   --   ALBUMIN 3.7  --   --   --   --   AST 85*  --   --   --   --   ALT 38  --   --   --   --   ALKPHOS 82  --   --   --   --   BILITOT 1.7*  --   --   --   --   GFRNONAA 33* 46*  --   --   --   ANIONGAP 11 17*  --   --   --  Hematology Recent Labs  Lab 08/11/20 1329 08/11/20 1534 08/12/20 0248  WBC 4.6  --  6.1  RBC 5.59*  --  5.18*  HGB 17.6* 16.3* 16.2*  HCT 47.4* 48.0* 44.5  MCV 84.8  --  85.9  MCH 31.5  --  31.3  MCHC 37.1*  --  36.4*  RDW 12.5  --  12.3  PLT 258  --  259    BNPNo results for input(s): BNP, PROBNP in the last 168 hours.   DDimer No results for input(s): DDIMER in the last 168 hours.   Radiology    DG Chest 2 View  Result Date: 08/11/2020 CLINICAL DATA:  Chest pain, weakness and vomiting since this morning. EXAM: CHEST - 2 VIEW COMPARISON:  07/23/2018 FINDINGS: Cardiac silhouette is normal in size. No mediastinal or hilar masses or evidence of adenopathy. Right anterior chest wall Port-A-Cath extends through the internal jugular vein, tip in the mid superior vena cava, stable. Lungs are hyperexpanded, but clear. No pleural effusion or pneumothorax. Skeletal structures are demineralized but intact. IMPRESSION: 1. No acute cardiopulmonary disease. Electronically Signed   By: Lajean Manes M.D.   On: 08/11/2020 14:14   ECHOCARDIOGRAM COMPLETE  Result Date: 08/11/2020    ECHOCARDIOGRAM REPORT   Patient Name:   Tina Kennedy Date of Exam: 08/11/2020 Medical Rec #:  [id number]                      Height:       65.0 in Accession #:    [id number]                      Weight:       98.0 lb Date of Birth:  12/22/1950                      BSA:          1.461 m Patient Age:    70 years                       BP:           147/91 mmHg Patient Gender: F                              HR:           93 bpm. Exam Location:  Inpatient Procedure: 2D Echo STAT ECHO Indications:    NSTEMI  History:        Patient has no prior history of Echocardiogram examinations.  Sonographer:    Johny Chess Referring Phys: [id number] TYRONE A KYLE  Sonographer Comments: Technically difficult study due to poor echo windows. Image acquisition challenging due to respiratory motion. IMPRESSIONS  1. Left ventricular ejection fraction, by estimation, is 60 to 65%. The left ventricle has normal function. Left ventricular endocardial border not optimally defined to evaluate regional wall motion though no wall motion abnormalities seen in foreshortened views. Left ventricular diastolic parameters are consistent with Grade I diastolic dysfunction (impaired relaxation).  2. Right ventricular systolic function is normal. The right ventricular size is normal.  3. The mitral valve is grossly normal. Trivial mitral valve regurgitation. No evidence of mitral stenosis.  4. The aortic valve is grossly normal. Aortic valve regurgitation is not visualized. No aortic stenosis is present.  5. The inferior vena cava is normal in size with  greater than 50% respiratory variability, suggesting right atrial pressure of 3 mmHg. Comparison(s): No prior Echocardiogram. FINDINGS  Left Ventricle: Left ventricular ejection fraction, by estimation, is 60 to 65%. The left ventricle has normal function. Left ventricular endocardial border not optimally defined to evaluate regional wall motion. The left ventricular internal cavity size was small. There is no left ventricular hypertrophy. Left ventricular diastolic parameters are consistent with Grade I diastolic dysfunction (impaired relaxation). Right Ventricle: The right ventricular size  is normal. No increase in right ventricular wall thickness. Right ventricular systolic function is normal. Left Atrium: Left atrial size was normal in size. Right Atrium: Right atrial size was not well visualized. Pericardium: Trivial pericardial effusion is present. Mitral Valve: The mitral valve is grossly normal. Trivial mitral valve regurgitation. No evidence of mitral valve stenosis. Tricuspid Valve: The tricuspid valve is grossly normal. Tricuspid valve regurgitation is not demonstrated. Aortic Valve: The aortic valve is grossly normal. Aortic valve regurgitation is not visualized. No aortic stenosis is present. Pulmonic Valve: The pulmonic valve was not well visualized. Pulmonic valve regurgitation is not visualized. No evidence of pulmonic stenosis. Aorta: The aortic root and ascending aorta are structurally normal, with no evidence of dilitation. Venous: The inferior vena cava is normal in size with greater than 50% respiratory variability, suggesting right atrial pressure of 3 mmHg. IAS/Shunts: The atrial septum is grossly normal.  LEFT VENTRICLE PLAX 2D LVIDd:         3.70 cm  Diastology LVIDs:         2.45 cm  LV e' medial:    5.66 cm/s LV PW:         0.80 cm  LV E/e' medial:  9.2 LV IVS:        0.80 cm  LV e' lateral:   5.44 cm/s LVOT diam:     1.60 cm  LV E/e' lateral: 9.5 LV SV:         48 LV SV Index:   33 LVOT Area:     2.01 cm  RIGHT VENTRICLE             IVC RV S prime:     13.10 cm/s  IVC diam: 1.00 cm LEFT ATRIUM           Index LA diam:      2.30 cm 1.57 cm/m LA Vol (A4C): 18.7 ml 12.80 ml/m  AORTIC VALVE LVOT Vmax:   104.00 cm/s LVOT Vmean:  72.300 cm/s LVOT VTI:    0.238 m  AORTA Ao Root diam: 2.60 cm Ao Asc diam:  3.00 cm MITRAL VALVE MV Area (PHT): 1.81 cm     SHUNTS MV Decel Time: 419 msec     Systemic VTI:  0.24 m MV E velocity: 51.85 cm/s   Systemic Diam: 1.60 cm MV A velocity: 103.00 cm/s MV E/A ratio:  0.50 Rudean Haskell MD Electronically signed by Rudean Haskell MD  Signature Date/Time: 08/11/2020/7:37:33 PM    Final     Cardiac Studies   Echo 08/11/20 IMPRESSIONS    1. Left ventricular ejection fraction, by estimation, is 60 to 65%. The  left ventricle has normal function. Left ventricular endocardial border  not optimally defined to evaluate regional wall motion though no wall  motion abnormalities seen in  foreshortened views. Left ventricular diastolic parameters are consistent  with Grade I diastolic dysfunction (impaired relaxation).  2. Right ventricular systolic function is normal. The right ventricular  size is normal.  3. The mitral valve is  grossly normal. Trivial mitral valve  regurgitation. No evidence of mitral stenosis.  4. The aortic valve is grossly normal. Aortic valve regurgitation is not  visualized. No aortic stenosis is present.  5. The inferior vena cava is normal in size with greater than 50%  respiratory variability, suggesting right atrial pressure of 3 mmHg.   Comparison(s): No prior Echocardiogram.   FINDINGS  Left Ventricle: Left ventricular ejection fraction, by estimation, is 60  to 65%. The left ventricle has normal function. Left ventricular  endocardial border not optimally defined to evaluate regional wall motion.  The left ventricular internal cavity  size was small. There is no left ventricular hypertrophy. Left ventricular  diastolic parameters are consistent with Grade I diastolic dysfunction  (impaired relaxation).   Right Ventricle: The right ventricular size is normal. No increase in  right ventricular wall thickness. Right ventricular systolic function is  normal.   Left Atrium: Left atrial size was normal in size.   Right Atrium: Right atrial size was not well visualized.   Pericardium: Trivial pericardial effusion is present.   Mitral Valve: The mitral valve is grossly normal. Trivial mitral valve  regurgitation. No evidence of mitral valve stenosis.   Tricuspid Valve: The tricuspid  valve is grossly normal. Tricuspid valve  regurgitation is not demonstrated.   Aortic Valve: The aortic valve is grossly normal. Aortic valve  regurgitation is not visualized. No aortic stenosis is present.   Pulmonic Valve: The pulmonic valve was not well visualized. Pulmonic valve  regurgitation is not visualized. No evidence of pulmonic stenosis.   Aorta: The aortic root and ascending aorta are structurally normal, with  no evidence of dilitation.   Venous: The inferior vena cava is normal in size with greater than 50%  respiratory variability, suggesting right atrial pressure of 3 mmHg.   IAS/Shunts: The atrial septum is grossly normal.      Patient Profile     70 y.o. 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    Assessment & Plan    NSTEMI/Chest pain/abd pain with pk troponin 1805 and now trending down.  On IV heparin and IV NTG and plan for cardiac cath.   Echo with normal EF.  G1DD, LDL 64. --abd pain continues small BM yesterday. Hypoactive BS- ? CT abd.  Total bili elevated and AST elevated.  --CXR with no acute cardiopulmonary disease.  --briefly discussed cardiac cath but ?need to eval abd-NTG has helped. --pt is NPO will defer to Dr. Margaretann Loveless --lipids LDL 64, HDL 113 holding statin for now with abd pain.  Hyponatremia, chronic, recheck stat this AM  Hyperkalemia 7.5 on arrival. Now 3.3, BMP pending.  Hypopharyngeal Cancer. Follows with Dr. Alen Blew, radiation and chemo last year.  Had PEG but fell out and not replaced this time at her request.  Was replaced 07/20/20   GERD per IM  Recent COVID in early April - Incidental finding (treated with remdesivir)   this was when her PEG tube fell out and was replaced.  Now same abd pain.         For questions or updates, please contact [place] Please consult [email or web address] for contact info under        Signed, Cecilie Kicks, NP  08/12/2020, 6:47 AM    Patient seen and examined with  Cecilie Kicks NP.  Agree as above, with the following exceptions and changes as noted below. She continues to have abdominal pain, but otherwise says she feels pretty good. Gen: NAD, CV: RRR, no murmurs, Lungs: clear, Abd: soft, Extrem: Warm, well perfused, no edema, Neuro/Psych: alert and oriented x 3, normal mood and affect. All available labs, radiology testing, previous records reviewed. She feels her abdominal pain is reminiscent of when she was previously hospitalized with PEG tube falling out and incidental covid. She was given therapy for COVID which she states improved her abdominal pain. D/w Dr. Karleen Hampshire, will plan for abdomen evaluation today, and tentative plan for LHC in next 1-2 days if no acute adominal process. Abdomen is soft on exam without worrisome features for acute abdomen.   Elouise Munroe, MD 08/12/20 9:03 AM

## 2020-08-12 NOTE — Consult Note (Signed)
Cardiology Consultation:   Patient ID: Tina Kennedy MRN: [id number]; DOB: Aug 29, 1950  Admit date: 08/11/2020 Date of Consult: 08/12/2020  PCP:  Patient, No Pcp Per (Inactive)   [facility] Providers Cardiologist:  None        Patient Profile:   Tina Kennedy is a 70 y.o. 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 She was subsequently found to have a troponin elevation. Cardiology is now consulted for assistance with management.   History of Present Illness:   Tina Kennedy states that she was in her USOH until the morning of admission when she developed a nonproductive cough that led to midsternal chest discomfort. She states that the pain was constant in nature and did not radiate. It fortunately resolved on its own, but she decided to come to the ED for further evaluation.   In the ED, her ECG did not show any dynamic ST-T changes. However, her labs were notable for a hs-TnT of 1016 -> 1197 -> 1454 (was 9 when checked three weeks prior). She was subsequently started on a heparin and nitro gtt and transferred to [facility] for further evaluation.   At the time of my interview on the floor, Tina Kennedy is comfortable, but is endorsing some abdominal pain and shortness of breath. She however denies any associated chest pain, exertional chest pressure/discomfort, paroxysmal nocturnal dyspnea/orthopnea, irregular heart beat/palpitations, or presyncope/syncope. She also denies any hemoptysis or melena/hematochezia.     Past Medical History:  Diagnosis Date  . Hypopharyngeal cancer [facility])     Past Surgical History:  Procedure Laterality Date  . DIRECT LARYNGOSCOPY N/A 10/15/2019   Procedure: DIRECT LARYNGOSCOPY w/BIOPSY;  Surgeon: Izora Gala, MD;   Location: [place];  Service: ENT;  Laterality: N/A;  . GASTROSTOMY N/A 12/04/2019   Procedure: LAPAROSCOPIC G TUBE PLACEMENT;  Surgeon: Ralene Ok, MD;  Location: [facility];  Service: General;  Laterality: N/A;  . IR GASTROSTOMY TUBE MOD SED  11/15/2019  . IR IMAGING GUIDED PORT INSERTION  11/15/2019  . IR REPLC GASTRO/COLONIC TUBE PERCUT W/FLUORO  07/20/2020  . RIGID ESOPHAGOSCOPY N/A 10/15/2019   Procedure: RIGID ESOPHAGOSCOPY;  Surgeon: Izora Gala, MD;  Location: [place];  Service: ENT;  Laterality: N/A;       Inpatient Medications: Scheduled Meds: . pantoprazole (PROTONIX) IV  40 mg Intravenous Q24H   Continuous Infusions: . sodium chloride 20 mL/hr at 08/11/20 1335  . sodium chloride 125 mL/hr at 08/11/20 2339  . heparin 700 Units/hr (08/12/20 0149)  . nitroGLYCERIN 4.5 mcg/min (08/11/20 2249)   PRN Meds: acetaminophen, LORazepam, morphine injection, ondansetron (ZOFRAN) IV  Allergies:   No Known Allergies  Social History:   Social History   Socioeconomic History  . Marital status: Divorced    Spouse name: Not on file  . Number of children: 1  . Years of education: 23  . Highest education level: Not on file  Occupational History  . Not on file  Tobacco Use  . Smoking status: Current Some Day Smoker    Packs/day: 0.25    Types: Cigarettes  . Smokeless tobacco: Never Used  . Tobacco comment: Currently 1-2 cigarettes daily  Vaping Use  . Vaping Use: Never used  Substance and Sexual Activity  . Alcohol use: Not Currently    Alcohol/week: 1.0 standard drink  Types: 1 Cans of beer per week    Comment: Occasional on weekend  . Drug use: Not Currently    Types: Marijuana    Comment: not used in 11/10/2019   . Sexual activity: Yes  Other Topics Concern  . Not on file  Social History Narrative  . Not on file   Social Determinants of Health   Financial Resource Strain: Not on file  Food Insecurity: Not on file  Transportation Needs: Unmet Transportation Needs  .  Lack of Transportation (Medical): Yes  . Lack of Transportation (Non-Medical): Yes  Physical Activity: Not on file  Stress: Not on file  Social Connections: Not on file  Intimate Partner Violence: Not on file    Family History:    Family History  Problem Relation Age of Onset  . Hypertension Mother   . Hypertension Sister      ROS:  Please see the history of present illness.   All other ROS reviewed and negative.     Physical Exam/Data:   Vitals:   08/11/20 2000 08/11/20 2030 08/11/20 2100 08/11/20 2255  BP: (!) 224/125 (!) 150/95 (!) 171/96 (!) 164/101  Pulse: (!) 109 87 90 90  Resp: (!) 21 16 (!) 22 18  Temp:    98.3 F (36.8 C)  TempSrc:    Oral  SpO2: 100% 100% 100% 95%  Weight:    42 kg  Height:    5\' 5"  (1.651 m)    Intake/Output Summary (Last 24 hours) at 08/12/2020 0222 Last data filed at 08/11/2020 1625 Gross per 24 hour  Intake 1050 ml  Output --  Net 1050 ml   Last 3 Weights 08/11/2020 08/11/2020 08/11/2020  Weight (lbs) 92 lb 9.5 oz 98 lb 98 lb  Weight (kg) 42 kg 44.453 kg 44.453 kg     Body mass index is 15.41 kg/m.  General:  Chronically ill appearing but in no acute distress HEENT: normal Neck: no JVD Cardiac:  normal S1, S2; RRR; no murmur/rubs/gallops Lungs:  clear to auscultation bilaterally, no wheezing, rhonchi or rales  Abd: soft, nontender, no hepatomegaly  Ext: no edema Skin: warm and dry  Neuro:  no focal abnormalities noted Psych:  Normal affect    Relevant CV Studies: TTE pending  Laboratory Data:  High Sensitivity Troponin:   Recent Labs  Lab 07/22/20 0149 08/11/20 1328 08/11/20 1504 08/11/20 2323  TROPONINIHS 9 1,016* 1,197* 1,454*     Chemistry Recent Labs  Lab 08/11/20 1328 08/11/20 1504 08/11/20 1534 08/11/20 2323  NA 123* 128* 127*  --   K >7.5* 4.5 4.3 3.5  CL 88* 92* 91*  --   CO2 24 19*  --   --   GLUCOSE 121* 119* 305*  --   BUN 11 11 10   --   CREATININE 1.66* 1.28* 1.10*  --   CALCIUM 8.8* 9.2  --   --    GFRNONAA 33* 46*  --   --   ANIONGAP 11 17*  --   --     Recent Labs  Lab 08/11/20 1328  PROT 7.5  ALBUMIN 3.7  AST 85*  ALT 38  ALKPHOS 82  BILITOT 1.7*   Hematology Recent Labs  Lab 08/11/20 1329 08/11/20 1534  WBC 4.6  --   RBC 5.59*  --   HGB 17.6* 16.3*  HCT 47.4* 48.0*  MCV 84.8  --   MCH 31.5  --   MCHC 37.1*  --   RDW 12.5  --  PLT 258  --    BNPNo results for input(s): BNP, PROBNP in the last 168 hours.  DDimer No results for input(s): DDIMER in the last 168 hours.   Radiology/Studies:  DG Chest 2 View  Result Date: 08/11/2020 CLINICAL DATA:  Chest pain, weakness and vomiting since this morning. EXAM: CHEST - 2 VIEW COMPARISON:  07/23/2018 FINDINGS: Cardiac silhouette is normal in size. No mediastinal or hilar masses or evidence of adenopathy. Right anterior chest wall Port-A-Cath extends through the internal jugular vein, tip in the mid superior vena cava, stable. Lungs are hyperexpanded, but clear. No pleural effusion or pneumothorax. Skeletal structures are demineralized but intact. IMPRESSION: 1. No acute cardiopulmonary disease. Electronically Signed   By: Lajean Manes M.D.   On: 08/11/2020 14:14   ECHOCARDIOGRAM COMPLETE  Result Date: 08/11/2020    ECHOCARDIOGRAM REPORT   Patient Name:   ROSANA DENHART [facility] [place] Date of Exam: 08/11/2020 Medical Rec #:  [id number]                      Height:       65.0 in Accession #:    [id number]                     Weight:       98.0 lb Date of Birth:  [birth year]                      BSA:          1.461 m Patient Age:    58 years                       BP:           147/91 mmHg Patient Gender: F                              HR:           93 bpm. Exam Location:  Inpatient Procedure: 2D Echo STAT ECHO Indications:    NSTEMI  History:        Patient has no prior history of Echocardiogram examinations.  Sonographer:    Johny Chess Referring Phys: [id number] TYRONE A KYLE  Sonographer Comments: Technically difficult study  due to poor echo windows. Image acquisition challenging due to respiratory motion. IMPRESSIONS  1. Left ventricular ejection fraction, by estimation, is 60 to 65%. The left ventricle has normal function. Left ventricular endocardial border not optimally defined to evaluate regional wall motion though no wall motion abnormalities seen in foreshortened views. Left ventricular diastolic parameters are consistent with Grade I diastolic dysfunction (impaired relaxation).  2. Right ventricular systolic function is normal. The right ventricular size is normal.  3. The mitral valve is grossly normal. Trivial mitral valve regurgitation. No evidence of mitral stenosis.  4. The aortic valve is grossly normal. Aortic valve regurgitation is not visualized. No aortic stenosis is present.  5. The inferior vena cava is normal in size with greater than 50% respiratory variability, suggesting right atrial pressure of 3 mmHg. Comparison(s): No prior Echocardiogram. FINDINGS  Left Ventricle: Left ventricular ejection fraction, by estimation, is 60 to 65%. The left ventricle has normal function. Left ventricular endocardial border not optimally defined to evaluate regional wall motion. The left ventricular internal cavity size was small. There is no left ventricular hypertrophy. Left ventricular diastolic parameters are consistent with  Grade I diastolic dysfunction (impaired relaxation). Right Ventricle: The right ventricular size is normal. No increase in right ventricular wall thickness. Right ventricular systolic function is normal. Left Atrium: Left atrial size was normal in size. Right Atrium: Right atrial size was not well visualized. Pericardium: Trivial pericardial effusion is present. Mitral Valve: The mitral valve is grossly normal. Trivial mitral valve regurgitation. No evidence of mitral valve stenosis. Tricuspid Valve: The tricuspid valve is grossly normal. Tricuspid valve regurgitation is not demonstrated. Aortic Valve:  The aortic valve is grossly normal. Aortic valve regurgitation is not visualized. No aortic stenosis is present. Pulmonic Valve: The pulmonic valve was not well visualized. Pulmonic valve regurgitation is not visualized. No evidence of pulmonic stenosis. Aorta: The aortic root and ascending aorta are structurally normal, with no evidence of dilitation. Venous: The inferior vena cava is normal in size with greater than 50% respiratory variability, suggesting right atrial pressure of 3 mmHg. IAS/Shunts: The atrial septum is grossly normal.  LEFT VENTRICLE PLAX 2D LVIDd:         3.70 cm  Diastology LVIDs:         2.45 cm  LV e' medial:    5.66 cm/s LV PW:         0.80 cm  LV E/e' medial:  9.2 LV IVS:        0.80 cm  LV e' lateral:   5.44 cm/s LVOT diam:     1.60 cm  LV E/e' lateral: 9.5 LV SV:         48 LV SV Index:   33 LVOT Area:     2.01 cm  RIGHT VENTRICLE             IVC RV S prime:     13.10 cm/s  IVC diam: 1.00 cm LEFT ATRIUM           Index LA diam:      2.30 cm 1.57 cm/m LA Vol (A4C): 18.7 ml 12.80 ml/m  AORTIC VALVE LVOT Vmax:   104.00 cm/s LVOT Vmean:  72.300 cm/s LVOT VTI:    0.238 m  AORTA Ao Root diam: 2.60 cm Ao Asc diam:  3.00 cm MITRAL VALVE MV Area (PHT): 1.81 cm     SHUNTS MV Decel Time: 419 msec     Systemic VTI:  0.24 m MV E velocity: 51.85 cm/s   Systemic Diam: 1.60 cm MV A velocity: 103.00 cm/s MV E/A ratio:  0.50 Rudean Haskell MD Electronically signed by Rudean Haskell MD Signature Date/Time: 08/11/2020/7:37:33 PM    Final      Assessment and Plan:   Irving Shows Antwanette Wesche is a 70 y.o. 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 She was subsequently found to have a troponin elevation. Cardiology is now consulted for assistance with management.    1. NSTEMI - Keep NPO except for meds for possible diagnostic LHC in AM  - Cont heparin IV infusion protocol - Symptom management with Nitro gtt - Trend trop and serial ECGs -  Follow-up official read of TTE in AM - Aggressive risk factor modification with glycemic control, smoking cessation, and GDMT    Risk Assessment/Risk Scores:     TIMI Risk Score for Unstable Angina or Non-ST Elevation MI:   The patient's TIMI risk score is 3, which indicates  a 13% risk of all cause mortality, new or recurrent myocardial infarction or need for urgent revascularization in the next 14 days.          For questions or updates, please contact [place] Please consult [email or web address] for contact info under    Signed, Clois Dupes, MD  08/12/2020 2:22 AM

## 2020-08-12 NOTE — Progress Notes (Signed)
Critical Troponin Level  1454  notified Cardiology Patient denies chest pain and/or shortness of breath

## 2020-08-13 ENCOUNTER — Encounter (HOSPITAL_COMMUNITY)
Admission: EM | Disposition: A | Discharge status: Home / Self Care | Payer: Self-pay | Source: Home / Self Care | Attending: Internal Medicine

## 2020-08-13 ENCOUNTER — Encounter (HOSPITAL_COMMUNITY): Payer: Self-pay | Admitting: Internal Medicine

## 2020-08-13 DIAGNOSIS — E871 Hypo-osmolality and hyponatremia: Secondary | ICD-10-CM

## 2020-08-13 DIAGNOSIS — E875 Hyperkalemia: Secondary | ICD-10-CM

## 2020-08-13 HISTORY — PX: LEFT HEART CATH AND CORONARY ANGIOGRAPHY: CATH118249

## 2020-08-13 LAB — HEPARIN LEVEL (UNFRACTIONATED): Heparin Unfractionated: 0.68 IU/mL (ref 0.30–0.70)

## 2020-08-13 LAB — CBC
HCT: 41.9 % (ref 36.0–46.0)
Hemoglobin: 15.2 g/dL — ABNORMAL HIGH (ref 12.0–15.0)
MCH: 31.7 pg (ref 26.0–34.0)
MCHC: 36.3 g/dL — ABNORMAL HIGH (ref 30.0–36.0)
MCV: 87.5 fL (ref 80.0–100.0)
Platelets: 168 10*3/uL (ref 150–400)
RBC: 4.79 MIL/uL (ref 3.87–5.11)
RDW: 12.7 % (ref 11.5–15.5)
WBC: 3.2 10*3/uL — ABNORMAL LOW (ref 4.0–10.5)
nRBC: 0 % (ref 0.0–0.2)

## 2020-08-13 LAB — POTASSIUM
Potassium: 4.1 mmol/L (ref 3.5–5.1)
Potassium: 3.9 mmol/L (ref 3.5–5.1)

## 2020-08-13 LAB — BASIC METABOLIC PANEL
Anion gap: 12 (ref 5–15)
BUN: 8 mg/dL (ref 8–23)
CO2: 21 mmol/L — ABNORMAL LOW (ref 22–32)
Calcium: 7.5 mg/dL — ABNORMAL LOW (ref 8.9–10.3)
Chloride: 96 mmol/L — ABNORMAL LOW (ref 98–111)
Creatinine, Ser: 0.91 mg/dL (ref 0.44–1.00)
GFR, Estimated: >60 mL/min (ref >60)
Glucose, Bld: 62 mg/dL — ABNORMAL LOW (ref 70–99)
Potassium: 4 mmol/L (ref 3.5–5.1)
Sodium: 129 mmol/L — ABNORMAL LOW (ref 135–145)

## 2020-08-13 SURGERY — LEFT HEART CATH AND CORONARY ANGIOGRAPHY
Anesthesia: LOCAL

## 2020-08-13 MED ORDER — SODIUM CHLORIDE 0.9 % IV SOLN: 250.0000 mL | INTRAVENOUS | Status: DC | PRN

## 2020-08-13 MED ORDER — SODIUM CHLORIDE 0.9% FLUSH: 3.0000 mL | Freq: Two times a day (BID) | INTRAVENOUS | Status: DC

## 2020-08-13 MED ORDER — HEPARIN (PORCINE) IN NACL 1000-0.9 UT/500ML-% IV SOLN
INTRAVENOUS | Status: DC | PRN
  Administered 2020-08-13 (×2): 500 mL

## 2020-08-13 MED ORDER — POTASSIUM CHLORIDE 10 MEQ/100ML IV SOLN
10.0000 meq | INTRAVENOUS | Status: AC
Start: 2020-08-13 — End: 2020-08-13
  Administered 2020-08-13 (×3): 10 meq via INTRAVENOUS
  Filled 2020-08-13 (×3): qty 100

## 2020-08-13 MED ORDER — VERAPAMIL HCL 2.5 MG/ML IV SOLN
INTRAVENOUS | Status: AC
  Filled 2020-08-13: qty 2

## 2020-08-13 MED ORDER — VERAPAMIL HCL 2.5 MG/ML IV SOLN
INTRAVENOUS | Status: DC | PRN
  Administered 2020-08-13: 10 mL via INTRA_ARTERIAL

## 2020-08-13 MED ORDER — SODIUM CHLORIDE 0.9% FLUSH: 3.0000 mL | INTRAVENOUS | Status: DC | PRN

## 2020-08-13 MED ORDER — HEPARIN SODIUM (PORCINE) 1000 UNIT/ML IJ SOLN
INTRAMUSCULAR | Status: AC
  Filled 2020-08-13: qty 1

## 2020-08-13 MED ORDER — SODIUM CHLORIDE 0.9 % IV SOLN: INTRAVENOUS | Status: DC

## 2020-08-13 MED ORDER — POLYETHYLENE GLYCOL 3350 17 G PO PACK
17.0000 g | PACK | Freq: Every day | ORAL | Status: DC
  Administered 2020-08-13 – 2020-08-14 (×2): 17 g via ORAL
  Filled 2020-08-13 (×2): qty 1

## 2020-08-13 MED ORDER — MIDAZOLAM HCL 2 MG/2ML IJ SOLN
INTRAMUSCULAR | Status: DC | PRN
  Administered 2020-08-13: 0.5 mg via INTRAVENOUS

## 2020-08-13 MED ORDER — IOHEXOL 350 MG/ML SOLN
INTRAVENOUS | Status: DC | PRN
  Administered 2020-08-13: 55 mL

## 2020-08-13 MED ORDER — SODIUM CHLORIDE 0.9 % IV SOLN: INTRAVENOUS | Status: AC

## 2020-08-13 MED ORDER — PANTOPRAZOLE SODIUM 40 MG PO TBEC
40.0000 mg | DELAYED_RELEASE_TABLET | Freq: Every day | ORAL | Status: DC
  Administered 2020-08-13: 40 mg via ORAL
  Filled 2020-08-13: qty 1

## 2020-08-13 MED ORDER — FENTANYL CITRATE (PF) 100 MCG/2ML IJ SOLN
INTRAMUSCULAR | Status: DC | PRN
  Administered 2020-08-13: 12.5 ug via INTRAVENOUS

## 2020-08-13 MED ORDER — POTASSIUM CHLORIDE 10 MEQ/100ML IV SOLN: 10.0000 meq | INTRAVENOUS | Status: DC

## 2020-08-13 MED ORDER — ASPIRIN 81 MG PO CHEW
81.0000 mg | CHEWABLE_TABLET | ORAL | Status: AC
  Administered 2020-08-13: 81 mg via ORAL
  Filled 2020-08-13: qty 1

## 2020-08-13 MED ORDER — METOPROLOL SUCCINATE ER 25 MG PO TB24
12.5000 mg | ORAL_TABLET | Freq: Every day | ORAL | Status: DC
  Administered 2020-08-13 – 2020-08-14 (×2): 12.5 mg via ORAL
  Filled 2020-08-13 (×2): qty 1

## 2020-08-13 MED ORDER — HYDRALAZINE HCL 20 MG/ML IJ SOLN: 10.0000 mg | INTRAMUSCULAR | Status: AC | PRN

## 2020-08-13 MED ORDER — SODIUM CHLORIDE 0.9% FLUSH
3.0000 mL | Freq: Two times a day (BID) | INTRAVENOUS | Status: DC
  Administered 2020-08-13 – 2020-08-14 (×2): 3 mL via INTRAVENOUS

## 2020-08-13 MED ORDER — HEPARIN (PORCINE) IN NACL 1000-0.9 UT/500ML-% IV SOLN
INTRAVENOUS | Status: AC
  Filled 2020-08-13: qty 1000

## 2020-08-13 MED ORDER — SENNOSIDES-DOCUSATE SODIUM 8.6-50 MG PO TABS
2.0000 | ORAL_TABLET | Freq: Two times a day (BID) | ORAL | Status: DC
  Administered 2020-08-13 – 2020-08-14 (×3): 2 via ORAL
  Filled 2020-08-13 (×3): qty 2

## 2020-08-13 MED ORDER — LIDOCAINE HCL (PF) 1 % IJ SOLN
INTRAMUSCULAR | Status: DC | PRN
  Administered 2020-08-13: 2 mL

## 2020-08-13 MED ORDER — MIDAZOLAM HCL 2 MG/2ML IJ SOLN
INTRAMUSCULAR | Status: AC
  Filled 2020-08-13: qty 2

## 2020-08-13 MED ORDER — LIDOCAINE HCL (PF) 1 % IJ SOLN
INTRAMUSCULAR | Status: AC
  Filled 2020-08-13: qty 30

## 2020-08-13 MED ORDER — LABETALOL HCL 5 MG/ML IV SOLN: 10.0000 mg | INTRAVENOUS | Status: AC | PRN

## 2020-08-13 MED ORDER — FENTANYL CITRATE (PF) 100 MCG/2ML IJ SOLN
INTRAMUSCULAR | Status: AC
  Filled 2020-08-13: qty 2

## 2020-08-13 MED ORDER — HEPARIN SODIUM (PORCINE) 1000 UNIT/ML IJ SOLN
INTRAMUSCULAR | Status: DC | PRN
  Administered 2020-08-13: 2000 [IU] via INTRAVENOUS

## 2020-08-13 MED ORDER — BISACODYL 10 MG RE SUPP
10.0000 mg | Freq: Once | RECTAL | Status: AC
  Administered 2020-08-13: 10 mg via RECTAL
  Filled 2020-08-13: qty 1

## 2020-08-13 MED ORDER — ENOXAPARIN SODIUM 30 MG/0.3ML IJ SOSY
30.0000 mg | PREFILLED_SYRINGE | INTRAMUSCULAR | Status: DC
  Administered 2020-08-14: 30 mg via SUBCUTANEOUS
  Filled 2020-08-13: qty 0.3

## 2020-08-13 MED ORDER — ENOXAPARIN SODIUM 40 MG/0.4ML IJ SOSY: 40.0000 mg | PREFILLED_SYRINGE | INTRAMUSCULAR | Status: DC

## 2020-08-13 SURGICAL SUPPLY — 11 items
CATH INFINITI 5FR ANG PIGTAIL (CATHETERS) ×2 IMPLANT
CATH OPTITORQUE TIG 4.0 5F (CATHETERS) ×2 IMPLANT
DEVICE RAD TR BAND REGULAR (VASCULAR PRODUCTS) ×2 IMPLANT
GLIDESHEATH SLEND SS 6F .021 (SHEATH) ×2 IMPLANT
GUIDEWIRE INQWIRE 1.5J.035X260 (WIRE) ×1 IMPLANT
INQWIRE 1.5J .035X260CM (WIRE) ×2
KIT HEART LEFT (KITS) ×2 IMPLANT
PACK CARDIAC CATHETERIZATION (CUSTOM PROCEDURE TRAY) ×2 IMPLANT
SYR MEDRAD MARK 7 150ML (SYRINGE) ×2 IMPLANT
TRANSDUCER W/STOPCOCK (MISCELLANEOUS) ×2 IMPLANT
TUBING CIL FLEX 10 FLL-RA (TUBING) ×2 IMPLANT

## 2020-08-13 NOTE — Progress Notes (Addendum)
Progress Note  Patient Name: Tina Kennedy Date of Encounter: 08/13/2020  [facility] Cardiologist: Elouise Munroe, MD   Subjective   Feels much better, no abd pain.  No SOB.    Inpatient Medications    Scheduled Meds: . feeding supplement  237 mL Oral TID BM  . multivitamin with minerals  1 tablet Oral Daily  . pantoprazole (PROTONIX) IV  40 mg Intravenous Q24H   Continuous Infusions: . sodium chloride 20 mL/hr at 08/11/20 1335  . sodium chloride 125 mL/hr at 08/13/20 0604  . heparin 800 Units/hr (08/13/20 0047)  . nitroGLYCERIN 4.5 mcg/min (08/11/20 2249)   PRN Meds: acetaminophen, HYDROmorphone (DILAUDID) injection, LORazepam, ondansetron (ZOFRAN) IV, oxyCODONE-acetaminophen, traZODone   Vital Signs    Vitals:   08/12/20 0809 08/12/20 1822 08/12/20 2011 08/13/20 0445  BP: (!) 160/103 (!) 145/107 138/88 130/82  Pulse: 90 91 71 79  Resp: 17 18 20 18   Temp: 99 F (37.2 C) 99 F (37.2 C)  97.8 F (36.6 C)  TempSrc: Oral Oral  Oral  SpO2: 100%  92% 100%  Weight:    41.3 kg  Height:        Intake/Output Summary (Last 24 hours) at 08/13/2020 0808 Last data filed at 08/12/2020 2100 Gross per 24 hour  Intake 2963.62 ml  Output --  Net 2963.62 ml   Last 3 Weights 08/13/2020 08/12/2020 08/11/2020  Weight (lbs) 91 lb 88 lb 9.6 oz 92 lb 9.5 oz  Weight (kg) 41.277 kg 40.189 kg 42 kg      Telemetry    SR with deep T wave inversions will recheck EKG- Personally Reviewed  ECG    No new - Personally Reviewed  Physical Exam   GEN: No acute distress.   Neck: No JVD Cardiac: RRR, no murmurs, rubs, or gallops.  Respiratory: Clear to auscultation bilaterally. GI: Soft, nontender, non-distended  MS: No edema; No deformity. Neuro:  Nonfocal  Psych: Normal affect   Labs    High Sensitivity Troponin:   Recent Labs  Lab 08/11/20 1328 08/11/20 1504 08/11/20 2323 08/12/20 0248 08/12/20 0523  TROPONINIHS 1,016* 1,197* 1,454* 1,805* 1,463*       Chemistry Recent Labs  Lab 08/11/20 1328 08/11/20 1504 08/11/20 1534 08/11/20 2323 08/12/20 0755 08/12/20 1024 08/12/20 1556 08/12/20 1846 08/12/20 2214 08/13/20 0353  NA 123* 128* 127*  --  131*  --   --   --   --   --   K >7.5* 4.5 4.3   < > 4.2   < > 2.9* 3.0* 3.0* 4.1  CL 88* 92* 91*  --  96*  --   --   --   --   --   CO2 24 19*  --   --  25  --   --   --   --   --   GLUCOSE 121* 119* 305*  --  105*  --   --   --   --   --   BUN 11 11 10   --  10  --   --   --   --   --   CREATININE 1.66* 1.28* 1.10*  --  1.09*  --   --   --   --   --   CALCIUM 8.8* 9.2  --   --  8.6*  --   --   --   --   --   PROT 7.5  --   --   --   --   --  6.1*  --   --   --   ALBUMIN 3.7  --   --   --   --   --  2.8*  --   --   --   AST 85*  --   --   --   --   --  31  --   --   --   ALT 38  --   --   --   --   --  11  --   --   --   ALKPHOS 82  --   --   --   --   --  71  --   --   --   BILITOT 1.7*  --   --   --   --   --  0.9  --   --   --   GFRNONAA 33* 46*  --   --  55*  --   --   --   --   --   ANIONGAP 11 17*  --   --  10  --   --   --   --   --    < > = values in this interval not displayed.     Hematology Recent Labs  Lab 08/11/20 1329 08/11/20 1534 08/12/20 0248  WBC 4.6  --  6.1  RBC 5.59*  --  5.18*  HGB 17.6* 16.3* 16.2*  HCT 47.4* 48.0* 44.5  MCV 84.8  --  85.9  MCH 31.5  --  31.3  MCHC 37.1*  --  36.4*  RDW 12.5  --  12.3  PLT 258  --  259    BNPNo results for input(s): BNP, PROBNP in the last 168 hours.   DDimer No results for input(s): DDIMER in the last 168 hours.   Radiology    DG Chest 2 View  Result Date: 08/11/2020 CLINICAL DATA:  Chest pain, weakness and vomiting since this morning. EXAM: CHEST - 2 VIEW COMPARISON:  07/23/2018 FINDINGS: Cardiac silhouette is normal in size. No mediastinal or hilar masses or evidence of adenopathy. Right anterior chest wall Port-A-Cath extends through the internal jugular vein, tip in the mid superior vena cava, stable. Lungs  are hyperexpanded, but clear. No pleural effusion or pneumothorax. Skeletal structures are demineralized but intact. IMPRESSION: 1. No acute cardiopulmonary disease. Electronically Signed   By: Lajean Manes M.D.   On: 08/11/2020 14:14   CT ABDOMEN PELVIS W CONTRAST  Result Date: 08/12/2020 CLINICAL DATA:  Acute nonlocalized abdominal pain EXAM: CT ABDOMEN AND PELVIS WITH CONTRAST TECHNIQUE: Multidetector CT imaging of the abdomen and pelvis was performed using the standard protocol following bolus administration of intravenous contrast. CONTRAST:  34mL OMNIPAQUE IOHEXOL 300 MG/ML  SOLN COMPARISON:  07/21/2020 FINDINGS: Lower chest: No acute abnormality. Hepatobiliary: The gallbladder is distended without pericholecystic inflammatory change identified. The liver is unremarkable. No intra or extrahepatic biliary ductal dilation. Pancreas: Unremarkable Spleen: Unremarkable Adrenals/Urinary Tract: Adrenal glands are unremarkable. Kidneys are normal, without renal calculi, focal lesion, or hydronephrosis. Bladder is unremarkable. Stomach/Bowel: Previously noted gastrostomy catheter has been removed. Moderate stool is seen throughout the colon. Large volume stool is again seen within the rectal vault with stable stable mild circumferential thickening of the gastric antrum and pylorus. The stomach, small bowel, and large bowel are otherwise unremarkable. No evidence of obstruction. No free intraperitoneal gas or fluid. Mild circumferential rectal wall thickening in keeping with mild proctitis. Vascular/Lymphatic: There is extensive aortoiliac atherosclerotic  calcification. No aortic aneurysm. No pathologic adenopathy within the abdomen and pelvis. Reproductive: Uterus absent. Stable left adnexal cyst versus peritoneal inclusion cyst measuring 1.6 x 3.6 cm. This was not metabolically active on prior PET CT examination of 11/07/2019. Other: Mild diffuse body wall subcutaneous edema is again identified in keeping with  mild anasarca. Musculoskeletal: No acute bone abnormality. No lytic or blastic bone lesion. IMPRESSION: Large stool again identified within the rectal vault with mild circumferential rectal wall thickening possibly reflecting changes of stercoral proctitis. Moderate stool throughout the colon. Stable left pelvic adnexal versus peritoneal inclusion cyst, metabolically quiescent on prior PET CT examination of 11/07/2019. Aortic Atherosclerosis (ICD10-I70.0). Electronically Signed   By: Fidela Salisbury MD   On: 08/12/2020 23:32   ECHOCARDIOGRAM COMPLETE  Result Date: 08/11/2020    ECHOCARDIOGRAM REPORT   Patient Name:   OPEL LEJEUNE [facility] Date of Exam: 08/11/2020 Medical Rec #:  [id number]                      Height:       65.0 in Accession #:    [id number]                     Weight:       98.0 lb Date of Birth:  [birth year]                      BSA:          1.461 m Patient Age:    70 years                       BP:           147/91 mmHg Patient Gender: F                              HR:           93 bpm. Exam Location:  Inpatient Procedure: 2D Echo STAT ECHO Indications:    NSTEMI  History:        Patient has no prior history of Echocardiogram examinations.  Sonographer:    Johny Chess Referring Phys: [id number] TYRONE A KYLE  Sonographer Comments: Technically difficult study due to poor echo windows. Image acquisition challenging due to respiratory motion. IMPRESSIONS  1. Left ventricular ejection fraction, by estimation, is 60 to 65%. The left ventricle has normal function. Left ventricular endocardial border not optimally defined to evaluate regional wall motion though no wall motion abnormalities seen in foreshortened views. Left ventricular diastolic parameters are consistent with Grade I diastolic dysfunction (impaired relaxation).  2. Right ventricular systolic function is normal. The right ventricular size is normal.  3. The mitral valve is grossly normal. Trivial mitral valve regurgitation. No  evidence of mitral stenosis.  4. The aortic valve is grossly normal. Aortic valve regurgitation is not visualized. No aortic stenosis is present.  5. The inferior vena cava is normal in size with greater than 50% respiratory variability, suggesting right atrial pressure of 3 mmHg. Comparison(s): No prior Echocardiogram. FINDINGS  Left Ventricle: Left ventricular ejection fraction, by estimation, is 60 to 65%. The left ventricle has normal function. Left ventricular endocardial border not optimally defined to evaluate regional wall motion. The left ventricular internal cavity size was small. There is no left ventricular hypertrophy. Left ventricular diastolic parameters are consistent with Grade I diastolic dysfunction (impaired  relaxation). Right Ventricle: The right ventricular size is normal. No increase in right ventricular wall thickness. Right ventricular systolic function is normal. Left Atrium: Left atrial size was normal in size. Right Atrium: Right atrial size was not well visualized. Pericardium: Trivial pericardial effusion is present. Mitral Valve: The mitral valve is grossly normal. Trivial mitral valve regurgitation. No evidence of mitral valve stenosis. Tricuspid Valve: The tricuspid valve is grossly normal. Tricuspid valve regurgitation is not demonstrated. Aortic Valve: The aortic valve is grossly normal. Aortic valve regurgitation is not visualized. No aortic stenosis is present. Pulmonic Valve: The pulmonic valve was not well visualized. Pulmonic valve regurgitation is not visualized. No evidence of pulmonic stenosis. Aorta: The aortic root and ascending aorta are structurally normal, with no evidence of dilitation. Venous: The inferior vena cava is normal in size with greater than 50% respiratory variability, suggesting right atrial pressure of 3 mmHg. IAS/Shunts: The atrial septum is grossly normal.  LEFT VENTRICLE PLAX 2D LVIDd:         3.70 cm  Diastology LVIDs:         2.45 cm  LV e' medial:     5.66 cm/s LV PW:         0.80 cm  LV E/e' medial:  9.2 LV IVS:        0.80 cm  LV e' lateral:   5.44 cm/s LVOT diam:     1.60 cm  LV E/e' lateral: 9.5 LV SV:         48 LV SV Index:   33 LVOT Area:     2.01 cm  RIGHT VENTRICLE             IVC RV S prime:     13.10 cm/s  IVC diam: 1.00 cm LEFT ATRIUM           Index LA diam:      2.30 cm 1.57 cm/m LA Vol (A4C): 18.7 ml 12.80 ml/m  AORTIC VALVE LVOT Vmax:   104.00 cm/s LVOT Vmean:  72.300 cm/s LVOT VTI:    0.238 m  AORTA Ao Root diam: 2.60 cm Ao Asc diam:  3.00 cm MITRAL VALVE MV Area (PHT): 1.81 cm     SHUNTS MV Decel Time: 419 msec     Systemic VTI:  0.24 m MV E velocity: 51.85 cm/s   Systemic Diam: 1.60 cm MV A velocity: 103.00 cm/s MV E/A ratio:  0.50 Rudean Haskell MD Electronically signed by Rudean Haskell MD Signature Date/Time: 08/11/2020/7:37:33 PM    Final     Cardiac Studies   Echo 08/11/20 IMPRESSIONS    1. Left ventricular ejection fraction, by estimation, is 60 to 65%. The  left ventricle has normal function. Left ventricular endocardial border  not optimally defined to evaluate regional wall motion though no wall  motion abnormalities seen in  foreshortened views. Left ventricular diastolic parameters are consistent  with Grade I diastolic dysfunction (impaired relaxation).  2. Right ventricular systolic function is normal. The right ventricular  size is normal.  3. The mitral valve is grossly normal. Trivial mitral valve  regurgitation. No evidence of mitral stenosis.  4. The aortic valve is grossly normal. Aortic valve regurgitation is not  visualized. No aortic stenosis is present.  5. The inferior vena cava is normal in size with greater than 50%  respiratory variability, suggesting right atrial pressure of 3 mmHg.   Comparison(s): No prior Echocardiogram.   FINDINGS  Left Ventricle: Left ventricular ejection fraction, by estimation, is  60  to 65%. The left ventricle has normal function. Left  ventricular  endocardial border not optimally defined to evaluate regional wall motion.  The left ventricular internal cavity  size was small. There is no left ventricular hypertrophy. Left ventricular  diastolic parameters are consistent with Grade I diastolic dysfunction  (impaired relaxation).   Right Ventricle: The right ventricular size is normal. No increase in  right ventricular wall thickness. Right ventricular systolic function is  normal.   Left Atrium: Left atrial size was normal in size.   Right Atrium: Right atrial size was not well visualized.   Pericardium: Trivial pericardial effusion is present.   Mitral Valve: The mitral valve is grossly normal. Trivial mitral valve  regurgitation. No evidence of mitral valve stenosis.   Tricuspid Valve: The tricuspid valve is grossly normal. Tricuspid valve  regurgitation is not demonstrated.   Aortic Valve: The aortic valve is grossly normal. Aortic valve  regurgitation is not visualized. No aortic stenosis is present.   Pulmonic Valve: The pulmonic valve was not well visualized. Pulmonic valve  regurgitation is not visualized. No evidence of pulmonic stenosis.   Aorta: The aortic root and ascending aorta are structurally normal, with  no evidence of dilitation.   Venous: The inferior vena cava is normal in size with greater than 50%  respiratory variability, suggesting right atrial pressure of 3 mmHg.   IAS/Shunts: The atrial septum is grossly normal.      Patient Profile     70 y.o. 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  On IV heparin and IV NTG and plan for cardiac cath.   Echo with normal EF.   G1DD, LDL 64. --abd pain resolved small BM 2 days ago . Hypoactive BS-  CT of  abd.  with no acute process. Total bili elevated and AST elevated initially but improving.Marland Kitchen  --CXR with no acute cardiopulmonary disease.  --discussed cardiac cath but -NTG has helped. --pt is NPO will and on board for cardiac cath today.  --lipids LDL 64, HDL 113 holding statin for now with abd pain.  The patient understands that risks included but are not limited to stroke (1 in 1000), death (1 in 61), kidney failure [usually temporary] (1 in 500), bleeding (1 in 200), allergic reaction [possibly serious] (1 in 200).    Hyponatremia, chronic,131 yesterday AM   Hyperkalemia 7.5 on arrival. Now 4.1 , Na yesterday 131 Cr 1.09 AST improved. .  Hypopharyngeal Cancer/severe malnutrition. Follows with Dr. Alen Blew, radiation and chemo last year.  Had PEG but fell out and not replaced this time at her request.  previously replaced 07/20/20   GERD per IM  Recent COVID in early April - Incidental finding (treated with remdesivir)   this was when her PEG tube fell out and was replaced.  Now same abd pain.       For questions or updates, please contact [place] Please consult [email or web address] for contact info under        Signed, Cecilie Kicks, NP  08/13/2020, 8:08 AM    Patient seen and examined with Cecilie Kicks NP.  Agree as above, with the following exceptions and changes as noted below. Feels much better today. Gen: NAD, CV:  RRR, no murmurs, Lungs: clear, Abd: soft, Extrem: Warm, well perfused, no edema, Neuro/Psych: alert and oriented x 3, normal mood and affect. All available labs, radiology testing, previous records reviewed. For NSTEMI, will plan for LHC today. Consent as above. CT abdomen with no acute findings yesterday, and pain resolved.  Elouise Munroe, MD 08/13/20 12:32 PM

## 2020-08-13 NOTE — Interval H&P Note (Signed)
History and Physical Interval Note:  08/13/2020 2:22 PM  [place]  has presented today for surgery, with the diagnosis of NSTEMI.  The various methods of treatment have been discussed with the patient and family. After consideration of risks, benefits and other options for treatment, the patient has consented to  Procedure(s): LEFT HEART CATH AND CORONARY ANGIOGRAPHY (N/A) as a surgical intervention.  The patient's history has been reviewed, patient examined, no change in status, stable for surgery.  I have reviewed the patient's chart and labs.  Questions were answered to the patient's satisfaction.    Cath Lab Visit (complete for each Cath Lab visit)  Clinical Evaluation Leading to the Procedure:   ACS: Yes.    Non-ACS:  N/A  Samanta Gal

## 2020-08-13 NOTE — H&P (View-Only) (Signed)
Progress Note  Patient Name: Tina Kennedy Date of Encounter: 08/13/2020  [facility] Cardiologist: Elouise Munroe, MD   Subjective   Feels much better, no abd pain.  No SOB.    Inpatient Medications    Scheduled Meds: . feeding supplement  237 mL Oral TID BM  . multivitamin with minerals  1 tablet Oral Daily  . pantoprazole (PROTONIX) IV  40 mg Intravenous Q24H   Continuous Infusions: . sodium chloride 20 mL/hr at 08/11/20 1335  . sodium chloride 125 mL/hr at 08/13/20 0604  . heparin 800 Units/hr (08/13/20 0047)  . nitroGLYCERIN 4.5 mcg/min (08/11/20 2249)   PRN Meds: acetaminophen, HYDROmorphone (DILAUDID) injection, LORazepam, ondansetron (ZOFRAN) IV, oxyCODONE-acetaminophen, traZODone   Vital Signs    Vitals:   08/12/20 0809 08/12/20 1822 08/12/20 2011 08/13/20 0445  BP: (!) 160/103 (!) 145/107 138/88 130/82  Pulse: 90 91 71 79  Resp: 17 18 20 18   Temp: 99 F (37.2 C) 99 F (37.2 C)  97.8 F (36.6 C)  TempSrc: Oral Oral  Oral  SpO2: 100%  92% 100%  Weight:    41.3 kg  Height:        Intake/Output Summary (Last 24 hours) at 08/13/2020 0808 Last data filed at 08/12/2020 2100 Gross per 24 hour  Intake 2963.62 ml  Output --  Net 2963.62 ml   Last 3 Weights 08/13/2020 08/12/2020 08/11/2020  Weight (lbs) 91 lb 88 lb 9.6 oz 92 lb 9.5 oz  Weight (kg) 41.277 kg 40.189 kg 42 kg      Telemetry    SR with deep T wave inversions will recheck EKG- Personally Reviewed  ECG    No new - Personally Reviewed  Physical Exam   GEN: No acute distress.   Neck: No JVD Cardiac: RRR, no murmurs, rubs, or gallops.  Respiratory: Clear to auscultation bilaterally. GI: Soft, nontender, non-distended  MS: No edema; No deformity. Neuro:  Nonfocal  Psych: Normal affect   Labs    High Sensitivity Troponin:   Recent Labs  Lab 08/11/20 1328 08/11/20 1504 08/11/20 2323 08/12/20 0248 08/12/20 0523  TROPONINIHS 1,016* 1,197* 1,454* 1,805* 1,463*       Chemistry Recent Labs  Lab 08/11/20 1328 08/11/20 1504 08/11/20 1534 08/11/20 2323 08/12/20 0755 08/12/20 1024 08/12/20 1556 08/12/20 1846 08/12/20 2214 08/13/20 0353  NA 123* 128* 127*  --  131*  --   --   --   --   --   K >7.5* 4.5 4.3   < > 4.2   < > 2.9* 3.0* 3.0* 4.1  CL 88* 92* 91*  --  96*  --   --   --   --   --   CO2 24 19*  --   --  25  --   --   --   --   --   GLUCOSE 121* 119* 305*  --  105*  --   --   --   --   --   BUN 11 11 10   --  10  --   --   --   --   --   CREATININE 1.66* 1.28* 1.10*  --  1.09*  --   --   --   --   --   CALCIUM 8.8* 9.2  --   --  8.6*  --   --   --   --   --   PROT 7.5  --   --   --   --   --  6.1*  --   --   --   ALBUMIN 3.7  --   --   --   --   --  2.8*  --   --   --   AST 85*  --   --   --   --   --  31  --   --   --   ALT 38  --   --   --   --   --  11  --   --   --   ALKPHOS 82  --   --   --   --   --  71  --   --   --   BILITOT 1.7*  --   --   --   --   --  0.9  --   --   --   GFRNONAA 33* 46*  --   --  55*  --   --   --   --   --   ANIONGAP 11 17*  --   --  10  --   --   --   --   --    < > = values in this interval not displayed.     Hematology Recent Labs  Lab 08/11/20 1329 08/11/20 1534 08/12/20 0248  WBC 4.6  --  6.1  RBC 5.59*  --  5.18*  HGB 17.6* 16.3* 16.2*  HCT 47.4* 48.0* 44.5  MCV 84.8  --  85.9  MCH 31.5  --  31.3  MCHC 37.1*  --  36.4*  RDW 12.5  --  12.3  PLT 258  --  259    BNPNo results for input(s): BNP, PROBNP in the last 168 hours.   DDimer No results for input(s): DDIMER in the last 168 hours.   Radiology    DG Chest 2 View  Result Date: 08/11/2020 CLINICAL DATA:  Chest pain, weakness and vomiting since this morning. EXAM: CHEST - 2 VIEW COMPARISON:  07/23/2018 FINDINGS: Cardiac silhouette is normal in size. No mediastinal or hilar masses or evidence of adenopathy. Right anterior chest wall Port-A-Cath extends through the internal jugular vein, tip in the mid superior vena cava, stable. Lungs  are hyperexpanded, but clear. No pleural effusion or pneumothorax. Skeletal structures are demineralized but intact. IMPRESSION: 1. No acute cardiopulmonary disease. Electronically Signed   By: Lajean Manes M.D.   On: 08/11/2020 14:14   CT ABDOMEN PELVIS W CONTRAST  Result Date: 08/12/2020 CLINICAL DATA:  Acute nonlocalized abdominal pain EXAM: CT ABDOMEN AND PELVIS WITH CONTRAST TECHNIQUE: Multidetector CT imaging of the abdomen and pelvis was performed using the standard protocol following bolus administration of intravenous contrast. CONTRAST:  90mL OMNIPAQUE IOHEXOL 300 MG/ML  SOLN COMPARISON:  07/21/2020 FINDINGS: Lower chest: No acute abnormality. Hepatobiliary: The gallbladder is distended without pericholecystic inflammatory change identified. The liver is unremarkable. No intra or extrahepatic biliary ductal dilation. Pancreas: Unremarkable Spleen: Unremarkable Adrenals/Urinary Tract: Adrenal glands are unremarkable. Kidneys are normal, without renal calculi, focal lesion, or hydronephrosis. Bladder is unremarkable. Stomach/Bowel: Previously noted gastrostomy catheter has been removed. Moderate stool is seen throughout the colon. Large volume stool is again seen within the rectal vault with stable stable mild circumferential thickening of the gastric antrum and pylorus. The stomach, small bowel, and large bowel are otherwise unremarkable. No evidence of obstruction. No free intraperitoneal gas or fluid. Mild circumferential rectal wall thickening in keeping with mild proctitis. Vascular/Lymphatic: There is extensive aortoiliac atherosclerotic  calcification. No aortic aneurysm. No pathologic adenopathy within the abdomen and pelvis. Reproductive: Uterus absent. Stable left adnexal cyst versus peritoneal inclusion cyst measuring 1.6 x 3.6 cm. This was not metabolically active on prior PET CT examination of 11/07/2019. Other: Mild diffuse body wall subcutaneous edema is again identified in keeping with  mild anasarca. Musculoskeletal: No acute bone abnormality. No lytic or blastic bone lesion. IMPRESSION: Large stool again identified within the rectal vault with mild circumferential rectal wall thickening possibly reflecting changes of stercoral proctitis. Moderate stool throughout the colon. Stable left pelvic adnexal versus peritoneal inclusion cyst, metabolically quiescent on prior PET CT examination of 11/07/2019. Aortic Atherosclerosis (ICD10-I70.0). Electronically Signed   By: Fidela Salisbury MD   On: 08/12/2020 23:32   ECHOCARDIOGRAM COMPLETE  Result Date: 08/11/2020    ECHOCARDIOGRAM REPORT   Patient Name:   OPEL LEJEUNE [facility] Date of Exam: 08/11/2020 Medical Rec #:  [id number]                      Height:       65.0 in Accession #:    [id number]                     Weight:       98.0 lb Date of Birth:  [birth year]                      BSA:          1.461 m Patient Age:    43 years                       BP:           147/91 mmHg Patient Gender: F                              HR:           93 bpm. Exam Location:  Inpatient Procedure: 2D Echo STAT ECHO Indications:    NSTEMI  History:        Patient has no prior history of Echocardiogram examinations.  Sonographer:    Johny Chess Referring Phys: [id number] TYRONE A KYLE  Sonographer Comments: Technically difficult study due to poor echo windows. Image acquisition challenging due to respiratory motion. IMPRESSIONS  1. Left ventricular ejection fraction, by estimation, is 60 to 65%. The left ventricle has normal function. Left ventricular endocardial border not optimally defined to evaluate regional wall motion though no wall motion abnormalities seen in foreshortened views. Left ventricular diastolic parameters are consistent with Grade I diastolic dysfunction (impaired relaxation).  2. Right ventricular systolic function is normal. The right ventricular size is normal.  3. The mitral valve is grossly normal. Trivial mitral valve regurgitation. No  evidence of mitral stenosis.  4. The aortic valve is grossly normal. Aortic valve regurgitation is not visualized. No aortic stenosis is present.  5. The inferior vena cava is normal in size with greater than 50% respiratory variability, suggesting right atrial pressure of 3 mmHg. Comparison(s): No prior Echocardiogram. FINDINGS  Left Ventricle: Left ventricular ejection fraction, by estimation, is 60 to 65%. The left ventricle has normal function. Left ventricular endocardial border not optimally defined to evaluate regional wall motion. The left ventricular internal cavity size was small. There is no left ventricular hypertrophy. Left ventricular diastolic parameters are consistent with Grade I diastolic dysfunction (impaired  relaxation). Right Ventricle: The right ventricular size is normal. No increase in right ventricular wall thickness. Right ventricular systolic function is normal. Left Atrium: Left atrial size was normal in size. Right Atrium: Right atrial size was not well visualized. Pericardium: Trivial pericardial effusion is present. Mitral Valve: The mitral valve is grossly normal. Trivial mitral valve regurgitation. No evidence of mitral valve stenosis. Tricuspid Valve: The tricuspid valve is grossly normal. Tricuspid valve regurgitation is not demonstrated. Aortic Valve: The aortic valve is grossly normal. Aortic valve regurgitation is not visualized. No aortic stenosis is present. Pulmonic Valve: The pulmonic valve was not well visualized. Pulmonic valve regurgitation is not visualized. No evidence of pulmonic stenosis. Aorta: The aortic root and ascending aorta are structurally normal, with no evidence of dilitation. Venous: The inferior vena cava is normal in size with greater than 50% respiratory variability, suggesting right atrial pressure of 3 mmHg. IAS/Shunts: The atrial septum is grossly normal.  LEFT VENTRICLE PLAX 2D LVIDd:         3.70 cm  Diastology LVIDs:         2.45 cm  LV e' medial:     5.66 cm/s LV PW:         0.80 cm  LV E/e' medial:  9.2 LV IVS:        0.80 cm  LV e' lateral:   5.44 cm/s LVOT diam:     1.60 cm  LV E/e' lateral: 9.5 LV SV:         48 LV SV Index:   33 LVOT Area:     2.01 cm  RIGHT VENTRICLE             IVC RV S prime:     13.10 cm/s  IVC diam: 1.00 cm LEFT ATRIUM           Index LA diam:      2.30 cm 1.57 cm/m LA Vol (A4C): 18.7 ml 12.80 ml/m  AORTIC VALVE LVOT Vmax:   104.00 cm/s LVOT Vmean:  72.300 cm/s LVOT VTI:    0.238 m  AORTA Ao Root diam: 2.60 cm Ao Asc diam:  3.00 cm MITRAL VALVE MV Area (PHT): 1.81 cm     SHUNTS MV Decel Time: 419 msec     Systemic VTI:  0.24 m MV E velocity: 51.85 cm/s   Systemic Diam: 1.60 cm MV A velocity: 103.00 cm/s MV E/A ratio:  0.50 Rudean Haskell MD Electronically signed by Rudean Haskell MD Signature Date/Time: 08/11/2020/7:37:33 PM    Final     Cardiac Studies   Echo 08/11/20 IMPRESSIONS    1. Left ventricular ejection fraction, by estimation, is 60 to 65%. The  left ventricle has normal function. Left ventricular endocardial border  not optimally defined to evaluate regional wall motion though no wall  motion abnormalities seen in  foreshortened views. Left ventricular diastolic parameters are consistent  with Grade I diastolic dysfunction (impaired relaxation).  2. Right ventricular systolic function is normal. The right ventricular  size is normal.  3. The mitral valve is grossly normal. Trivial mitral valve  regurgitation. No evidence of mitral stenosis.  4. The aortic valve is grossly normal. Aortic valve regurgitation is not  visualized. No aortic stenosis is present.  5. The inferior vena cava is normal in size with greater than 50%  respiratory variability, suggesting right atrial pressure of 3 mmHg.   Comparison(s): No prior Echocardiogram.   FINDINGS  Left Ventricle: Left ventricular ejection fraction, by estimation, is  60  to 65%. The left ventricle has normal function. Left  ventricular  endocardial border not optimally defined to evaluate regional wall motion.  The left ventricular internal cavity  size was small. There is no left ventricular hypertrophy. Left ventricular  diastolic parameters are consistent with Grade I diastolic dysfunction  (impaired relaxation).   Right Ventricle: The right ventricular size is normal. No increase in  right ventricular wall thickness. Right ventricular systolic function is  normal.   Left Atrium: Left atrial size was normal in size.   Right Atrium: Right atrial size was not well visualized.   Pericardium: Trivial pericardial effusion is present.   Mitral Valve: The mitral valve is grossly normal. Trivial mitral valve  regurgitation. No evidence of mitral valve stenosis.   Tricuspid Valve: The tricuspid valve is grossly normal. Tricuspid valve  regurgitation is not demonstrated.   Aortic Valve: The aortic valve is grossly normal. Aortic valve  regurgitation is not visualized. No aortic stenosis is present.   Pulmonic Valve: The pulmonic valve was not well visualized. Pulmonic valve  regurgitation is not visualized. No evidence of pulmonic stenosis.   Aorta: The aortic root and ascending aorta are structurally normal, with  no evidence of dilitation.   Venous: The inferior vena cava is normal in size with greater than 50%  respiratory variability, suggesting right atrial pressure of 3 mmHg.   IAS/Shunts: The atrial septum is grossly normal.      Patient Profile     70 y.o. 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  On IV heparin and IV NTG and plan for cardiac cath.   Echo with normal EF.   G1DD, LDL 64. --abd pain resolved small BM 2 days ago . Hypoactive BS-  CT of  abd.  with no acute process. Total bili elevated and AST elevated initially but improving.Marland Kitchen  --CXR with no acute cardiopulmonary disease.  --discussed cardiac cath but -NTG has helped. --pt is NPO will and on board for cardiac cath today.  --lipids LDL 64, HDL 113 holding statin for now with abd pain.  The patient understands that risks included but are not limited to stroke (1 in 1000), death (1 in 61), kidney failure [usually temporary] (1 in 500), bleeding (1 in 200), allergic reaction [possibly serious] (1 in 200).    Hyponatremia, chronic,131 yesterday AM   Hyperkalemia 7.5 on arrival. Now 4.1 , Na yesterday 131 Cr 1.09 AST improved. .  Hypopharyngeal Cancer/severe malnutrition. Follows with Dr. Alen Blew, radiation and chemo last year.  Had PEG but fell out and not replaced this time at her request.  previously replaced 07/20/20   GERD per IM  Recent COVID in early April - Incidental finding (treated with remdesivir)   this was when her PEG tube fell out and was replaced.  Now same abd pain.       For questions or updates, please contact [place] Please consult [email or web address] for contact info under        Signed, Cecilie Kicks, NP  08/13/2020, 8:08 AM    Patient seen and examined with Cecilie Kicks NP.  Agree as above, with the following exceptions and changes as noted below. Feels much better today. Gen: NAD, CV:  RRR, no murmurs, Lungs: clear, Abd: soft, Extrem: Warm, well perfused, no edema, Neuro/Psych: alert and oriented x 3, normal mood and affect. All available labs, radiology testing, previous records reviewed. For NSTEMI, will plan for LHC today. Consent as above. CT abdomen with no acute findings yesterday, and pain resolved.  Elouise Munroe, MD 08/13/20 12:32 PM

## 2020-08-13 NOTE — Progress Notes (Signed)
PROGRESS NOTE    Tina Kennedy  XYV:[id number] DOB: December 14, 1950 DOA: 08/11/2020 PCP: Patient, No Pcp Per (Inactive)    Chief Complaint  Patient presents with  . Chest Pain    Brief Narrative:   Tina Kennedy is a 71 y.o. 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 Presents with chest pain and cough. She was found to have elevated troponins and cardiology consulted.  EKG does not show any  St t wave changes. CXR show no active pulmonary disease.  Pt seen and examined at bedside.     Assessment & Plan:   Active Problems:   Hypopharyngeal cancer (HCC)   NSTEMI (non-ST elevated myocardial infarction) (HCC)   AKI (acute kidney injury) (HCC)   Hyponatremia   Hypokalemia   Abdominal pain   Protein-calorie malnutrition, severe  Abdominal pain with some nausea:  Unclear etiology. Differential include gastritis. No diarrhea or vomiting. Generalized / epigastric abd pain.  Elevated AST and bilirubin. Rpt liver enzymes, lipase within normal limits. Patient afebrile and WBC count within normal limits. Lactic acid wnl.  She was started on IV PPI.  CT abd and pelvis with contrast showed Large stool again identified within the rectal vault with mild circumferential rectal wall thickening possibly reflecting changes of stercoral proctitis. Moderate stool throughout the colon.  AKI:  Probably pre renal , resolved with IV fluids. Get UA.   Constipation; Stool softeners, laxatives and suppository ordered.   Hypokalemia:  Replaced.  Repeat levels wnl.   NSTEMI;  Demand ischemia/ leading to elevated troponin. Cardiology on board and is recommended 24 to 48 hours of IV heparin.  Lipid panel reviewed.  Echocardiogram Left ventricular ejection fraction, by estimation, is 60 to 65%, with  normal function. Left ventricular endocardial border  not optimally  defined to evaluate regional wall motion though no wall motion abnormalities seen in  foreshortened views. Left ventricular diastolic parameters are consistent  with Grade I diastolic dysfunction (impaired relaxation. Plan for cardiac cath today.    Chronic hyponatremia:  Sodium at baseline between 127 to 131.  Continue to monitor.    H/p dysphagia with PEG placement which has come out.  Pt reports taking soft diet at home. SLP eval will be ordered.    Severe malnutrition due to chronic illness.  Dietary on board.  Supplementation ordered.   Hypopharyngeal squamous cell ca s/p chemoradiation.  Follows up with Dr Clelia Croft as outpatient.    DVT prophylaxis: Heparin Code Status: FULL CODE.  Family Communication: none at bedside. Called family and unable to reach them, left voice mail.  Disposition:   Status is: Inpatient  Remains inpatient appropriate because:IV treatments appropriate due to intensity of illness or inability to take PO   Dispo: The patient is from: Home              Anticipated d/c is to: pending.               Patient currently is not medically stable to d/c.   Difficult to place patient No       Consultants:   Cardiology.    Procedures: Echocardiogram.    Antimicrobials: none.    Subjective: No chest pain or abd pain today.   Objective: Vitals:   08/12/20 0809 08/12/20 1822 08/12/20 2011 08/13/20 0445  BP: (!) 160/103 (!) 145/107 138/88 130/82  Pulse: 90 91 71 79  Resp: 17 18 20  18  Temp: 99 F (37.2 C) 99 F (37.2 C)  97.8 F (36.6 C)  TempSrc: Oral Oral  Oral  SpO2: 100%  92% 100%  Weight:    41.3 kg  Height:        Intake/Output Summary (Last 24 hours) at 08/13/2020 1036 Last data filed at 08/12/2020 2100 Gross per 24 hour  Intake 2963.62 ml  Output --  Net 2963.62 ml   Filed Weights   08/11/20 2255 08/12/20 0506 08/13/20 0445  Weight: 42 kg 40.2 kg 41.3 kg    Examination:  General exam:alert and comfortable, not in  distress.  Respiratory system: clear to auscultation, no wheezing heard.  Cardiovascular system: S1 S2 heard, no JVD, no pedal edema.  Gastrointestinal system: Abdomen is soft,non tender non distended bowel sounds wnl.  Central nervous system: Alert and able to answer all questions.  Extremities: no pedal edema.  Skin: No rashes seen.  Psychiatry:  Mood is appropriate.     Data Reviewed: I have personally reviewed following labs and imaging studies  CBC: Recent Labs  Lab 08/11/20 1329 08/11/20 1534 08/12/20 0248  WBC 4.6  --  6.1  HGB 17.6* 16.3* 16.2*  HCT 47.4* 48.0* 44.5  MCV 84.8  --  85.9  PLT 258  --  [id number]    Basic Metabolic Panel: Recent Labs  Lab 08/11/20 1328 08/11/20 1504 08/11/20 1534 08/11/20 2323 08/12/20 0755 08/12/20 1024 08/12/20 1846 08/12/20 2214 08/13/20 0353 08/13/20 0735 08/13/20 0819  NA 123* 128* 127*  --  131*  --   --   --   --   --  129*  K >7.5* 4.5 4.3   < > 4.2   < > 3.0* 3.0* 4.1 3.9 4.0  CL 88* 92* 91*  --  96*  --   --   --   --   --  96*  CO2 24 19*  --   --  25  --   --   --   --   --  21*  GLUCOSE 121* 119* 305*  --  105*  --   --   --   --   --  62*  BUN 11 11 10   --  10  --   --   --   --   --  8  CREATININE 1.66* 1.28* 1.10*  --  1.09*  --   --   --   --   --  0.91  CALCIUM 8.8* 9.2  --   --  8.6*  --   --   --   --   --  7.5*   < > = values in this interval not displayed.    GFR: Estimated Creatinine Clearance: 38.6 mL/min (by C-G formula based on SCr of 0.91 mg/dL).  Liver Function Tests: Recent Labs  Lab 08/11/20 1328 08/12/20 1556  AST 85* 31  ALT 38 11  ALKPHOS 82 71  BILITOT 1.7* 0.9  PROT 7.5 6.1*  ALBUMIN 3.7 2.8*    CBG: Recent Labs  Lab 08/11/20 1650  GLUCAP 116*     No results found for this or any previous visit (from the past 240 hour(s)).       Radiology Studies: DG Chest 2 View  Result Date: 08/11/2020 CLINICAL DATA:  Chest pain, weakness and vomiting since this morning. EXAM: CHEST  - 2 VIEW COMPARISON:  07/23/2018 FINDINGS: Cardiac silhouette is normal in size. No mediastinal or hilar masses or evidence of adenopathy. Right  anterior chest wall Port-A-Cath extends through the internal jugular vein, tip in the mid superior vena cava, stable. Lungs are hyperexpanded, but clear. No pleural effusion or pneumothorax. Skeletal structures are demineralized but intact. IMPRESSION: 1. No acute cardiopulmonary disease. Electronically Signed   By: Lajean Manes M.D.   On: 08/11/2020 14:14   CT ABDOMEN PELVIS W CONTRAST  Result Date: 08/12/2020 CLINICAL DATA:  Acute nonlocalized abdominal pain EXAM: CT ABDOMEN AND PELVIS WITH CONTRAST TECHNIQUE: Multidetector CT imaging of the abdomen and pelvis was performed using the standard protocol following bolus administration of intravenous contrast. CONTRAST:  66mL OMNIPAQUE IOHEXOL 300 MG/ML  SOLN COMPARISON:  07/21/2020 FINDINGS: Lower chest: No acute abnormality. Hepatobiliary: The gallbladder is distended without pericholecystic inflammatory change identified. The liver is unremarkable. No intra or extrahepatic biliary ductal dilation. Pancreas: Unremarkable Spleen: Unremarkable Adrenals/Urinary Tract: Adrenal glands are unremarkable. Kidneys are normal, without renal calculi, focal lesion, or hydronephrosis. Bladder is unremarkable. Stomach/Bowel: Previously noted gastrostomy catheter has been removed. Moderate stool is seen throughout the colon. Large volume stool is again seen within the rectal vault with stable stable mild circumferential thickening of the gastric antrum and pylorus. The stomach, small bowel, and large bowel are otherwise unremarkable. No evidence of obstruction. No free intraperitoneal gas or fluid. Mild circumferential rectal wall thickening in keeping with mild proctitis. Vascular/Lymphatic: There is extensive aortoiliac atherosclerotic calcification. No aortic aneurysm. No pathologic adenopathy within the abdomen and pelvis.  Reproductive: Uterus absent. Stable left adnexal cyst versus peritoneal inclusion cyst measuring 1.6 x 3.6 cm. This was not metabolically active on prior PET CT examination of 11/07/2019. Other: Mild diffuse body wall subcutaneous edema is again identified in keeping with mild anasarca. Musculoskeletal: No acute bone abnormality. No lytic or blastic bone lesion. IMPRESSION: Large stool again identified within the rectal vault with mild circumferential rectal wall thickening possibly reflecting changes of stercoral proctitis. Moderate stool throughout the colon. Stable left pelvic adnexal versus peritoneal inclusion cyst, metabolically quiescent on prior PET CT examination of 11/07/2019. Aortic Atherosclerosis (ICD10-I70.0). Electronically Signed   By: Fidela Salisbury MD   On: 08/12/2020 23:32   ECHOCARDIOGRAM COMPLETE  Result Date: 08/11/2020    ECHOCARDIOGRAM REPORT   Patient Name:   Tina Kennedy Date of Exam: 08/11/2020 Medical Rec #:  [id number]                      Height:       65.0 in Accession #:    [id number]                     Weight:       98.0 lb Date of Birth:  1951-02-04                      BSA:          1.461 m Patient Age:    32 years                       BP:           147/91 mmHg Patient Gender: F                              HR:           93 bpm. Exam Location:  Inpatient Procedure: 2D Echo STAT ECHO Indications:    NSTEMI  History:  Patient has no prior history of Echocardiogram examinations.  Sonographer:    Johny Chess Referring Phys: [id number] TYRONE A KYLE  Sonographer Comments: Technically difficult study due to poor echo windows. Image acquisition challenging due to respiratory motion. IMPRESSIONS  1. Left ventricular ejection fraction, by estimation, is 60 to 65%. The left ventricle has normal function. Left ventricular endocardial border not optimally defined to evaluate regional wall motion though no wall motion abnormalities seen in foreshortened views. Left  ventricular diastolic parameters are consistent with Grade I diastolic dysfunction (impaired relaxation).  2. Right ventricular systolic function is normal. The right ventricular size is normal.  3. The mitral valve is grossly normal. Trivial mitral valve regurgitation. No evidence of mitral stenosis.  4. The aortic valve is grossly normal. Aortic valve regurgitation is not visualized. No aortic stenosis is present.  5. The inferior vena cava is normal in size with greater than 50% respiratory variability, suggesting right atrial pressure of 3 mmHg. Comparison(s): No prior Echocardiogram. FINDINGS  Left Ventricle: Left ventricular ejection fraction, by estimation, is 60 to 65%. The left ventricle has normal function. Left ventricular endocardial border not optimally defined to evaluate regional wall motion. The left ventricular internal cavity size was small. There is no left ventricular hypertrophy. Left ventricular diastolic parameters are consistent with Grade I diastolic dysfunction (impaired relaxation). Right Ventricle: The right ventricular size is normal. No increase in right ventricular wall thickness. Right ventricular systolic function is normal. Left Atrium: Left atrial size was normal in size. Right Atrium: Right atrial size was not well visualized. Pericardium: Trivial pericardial effusion is present. Mitral Valve: The mitral valve is grossly normal. Trivial mitral valve regurgitation. No evidence of mitral valve stenosis. Tricuspid Valve: The tricuspid valve is grossly normal. Tricuspid valve regurgitation is not demonstrated. Aortic Valve: The aortic valve is grossly normal. Aortic valve regurgitation is not visualized. No aortic stenosis is present. Pulmonic Valve: The pulmonic valve was not well visualized. Pulmonic valve regurgitation is not visualized. No evidence of pulmonic stenosis. Aorta: The aortic root and ascending aorta are structurally normal, with no evidence of dilitation. Venous: The  inferior vena cava is normal in size with greater than 50% respiratory variability, suggesting right atrial pressure of 3 mmHg. IAS/Shunts: The atrial septum is grossly normal.  LEFT VENTRICLE PLAX 2D LVIDd:         3.70 cm  Diastology LVIDs:         2.45 cm  LV e' medial:    5.66 cm/s LV PW:         0.80 cm  LV E/e' medial:  9.2 LV IVS:        0.80 cm  LV e' lateral:   5.44 cm/s LVOT diam:     1.60 cm  LV E/e' lateral: 9.5 LV SV:         48 LV SV Index:   33 LVOT Area:     2.01 cm  RIGHT VENTRICLE             IVC RV S prime:     13.10 cm/s  IVC diam: 1.00 cm LEFT ATRIUM           Index LA diam:      2.30 cm 1.57 cm/m LA Vol (A4C): 18.7 ml 12.80 ml/m  AORTIC VALVE LVOT Vmax:   104.00 cm/s LVOT Vmean:  72.300 cm/s LVOT VTI:    0.238 m  AORTA Ao Root diam: 2.60 cm Ao Asc diam:  3.00 cm MITRAL VALVE  MV Area (PHT): 1.81 cm     SHUNTS MV Decel Time: 419 msec     Systemic VTI:  0.24 m MV E velocity: 51.85 cm/s   Systemic Diam: 1.60 cm MV A velocity: 103.00 cm/s MV E/A ratio:  0.50 Rudean Haskell MD Electronically signed by Rudean Haskell MD Signature Date/Time: 08/11/2020/7:37:33 PM    Final         Scheduled Meds: . aspirin  81 mg Oral Pre-Cath  . feeding supplement  237 mL Oral TID BM  . multivitamin with minerals  1 tablet Oral Daily  . pantoprazole (PROTONIX) IV  40 mg Intravenous Q24H  . polyethylene glycol  17 g Oral Daily  . senna-docusate  2 tablet Oral BID  . sodium chloride flush  3 mL Intravenous Q12H   Continuous Infusions: . sodium chloride 20 mL/hr at 08/11/20 1335  . sodium chloride 125 mL/hr at 08/13/20 0604  . sodium chloride    . sodium chloride    . heparin 800 Units/hr (08/13/20 0047)  . nitroGLYCERIN 4.5 mcg/min (08/11/20 2249)     LOS: 2 days        Hosie Poisson, MD [facility]   To contact the attending provider between 7A-7P or the covering provider during after hours 7P-7A, please log into the web site [email or web address] and access using  universal [place] password for that web site. If you do not have the password, please call the hospital operator.  08/13/2020, 10:36 AM

## 2020-08-13 NOTE — Progress Notes (Signed)
[place] for heparin Indication: NSTEMI  Labs: Recent Labs    08/11/20 1329 08/11/20 1504 08/11/20 1534 08/11/20 2323 08/12/20 0003 08/12/20 0248 08/12/20 0523 08/12/20 0755 08/12/20 2250 08/13/20 0735  HGB 17.6*  --  16.3*  --   --  16.2*  --   --   --   --   HCT 47.4*  --  48.0*  --   --  44.5  --   --   --   --   PLT 258  --   --   --   --  259  --   --   --   --   HEPARINUNFRC  --   --   --   --    < >  --   --  0.17* 0.85* 0.68  CREATININE  --  1.28* 1.10*  --   --   --   --  1.09*  --   --   TROPONINIHS  --  1,197*  --  1,454*  --  1,805* 1,463*  --   --   --    < > = values in this interval not displayed.    Assessment: 70 y.o. female with NSTEMI. No anticoagulation prior to admission. Pharmacy consulted for heparin.    Possible cath today. HL 0.68, down 7 hours after rate decrease. No CBC this morning.    Goal of Therapy:  Heparin level 0.3-0.7 units/ml  Monitor platelets per anticoagulation protocol   Plan:  Continue Heparin 800 units/hr Monitor daily HL, CBC/plt Monitor for signs/symptoms of bleeding  F/u if heart cath done   Benetta Spar, PharmD, BCPS, [facility] Clinical Pharmacist  Please check AMION for all [place] phone numbers After 10:00 PM, call [place]

## 2020-08-14 ENCOUNTER — Encounter (HOSPITAL_COMMUNITY): Payer: Self-pay | Admitting: Internal Medicine

## 2020-08-14 DIAGNOSIS — R9431 Abnormal electrocardiogram [ECG] [EKG]: Secondary | ICD-10-CM

## 2020-08-14 DIAGNOSIS — R1084 Generalized abdominal pain: Secondary | ICD-10-CM

## 2020-08-14 DIAGNOSIS — E43 Unspecified severe protein-calorie malnutrition: Secondary | ICD-10-CM

## 2020-08-14 LAB — BASIC METABOLIC PANEL
Anion gap: 7 (ref 5–15)
BUN: 6 mg/dL — ABNORMAL LOW (ref 8–23)
CO2: 22 mmol/L (ref 22–32)
Calcium: 7.5 mg/dL — ABNORMAL LOW (ref 8.9–10.3)
Chloride: 101 mmol/L (ref 98–111)
Creatinine, Ser: 1.06 mg/dL — ABNORMAL HIGH (ref 0.44–1.00)
GFR, Estimated: 57 mL/min — ABNORMAL LOW (ref >60)
Glucose, Bld: 58 mg/dL — ABNORMAL LOW (ref 70–99)
Potassium: 3.6 mmol/L (ref 3.5–5.1)
Sodium: 130 mmol/L — ABNORMAL LOW (ref 135–145)

## 2020-08-14 LAB — MAGNESIUM: Magnesium: 1.6 mg/dL — ABNORMAL LOW (ref 1.7–2.4)

## 2020-08-14 LAB — CBC
HCT: 38.9 % (ref 36.0–46.0)
Hemoglobin: 14.1 g/dL (ref 12.0–15.0)
MCH: 31.8 pg (ref 26.0–34.0)
MCHC: 36.2 g/dL — ABNORMAL HIGH (ref 30.0–36.0)
MCV: 87.6 fL (ref 80.0–100.0)
Platelets: 150 10*3/uL (ref 150–400)
RBC: 4.44 MIL/uL (ref 3.87–5.11)
RDW: 12.7 % (ref 11.5–15.5)
WBC: 2.5 10*3/uL — ABNORMAL LOW (ref 4.0–10.5)
nRBC: 0 % (ref 0.0–0.2)

## 2020-08-14 MED ORDER — LOSARTAN POTASSIUM 25 MG PO TABS
25.0000 mg | ORAL_TABLET | Freq: Every day | ORAL | Status: DC
  Administered 2020-08-14: 25 mg via ORAL
  Filled 2020-08-14: qty 1

## 2020-08-14 MED ORDER — ENSURE ENLIVE PO LIQD: 237.0000 mL | Freq: Three times a day (TID) | ORAL | 12 refills | Status: DC

## 2020-08-14 MED ORDER — METOPROLOL SUCCINATE ER 25 MG PO TB24: 12.5000 mg | ORAL_TABLET | Freq: Every day | ORAL | 1 refills | Status: DC

## 2020-08-14 MED ORDER — LOSARTAN POTASSIUM 25 MG PO TABS: 25.0000 mg | ORAL_TABLET | Freq: Every day | ORAL | 1 refills | Status: DC

## 2020-08-14 MED ORDER — POLYETHYLENE GLYCOL 3350 17 G PO PACK: 17.0000 g | PACK | Freq: Every day | ORAL | 0 refills | Status: DC

## 2020-08-14 MED ORDER — POTASSIUM CHLORIDE CRYS ER 20 MEQ PO TBCR
40.0000 meq | EXTENDED_RELEASE_TABLET | Freq: Once | ORAL | Status: AC
  Administered 2020-08-14: 40 meq via ORAL
  Filled 2020-08-14: qty 2

## 2020-08-14 MED ORDER — MAGNESIUM SULFATE 2 GM/50ML IV SOLN
2.0000 g | Freq: Once | INTRAVENOUS | Status: AC
  Administered 2020-08-14: 2 g via INTRAVENOUS
  Filled 2020-08-14: qty 50

## 2020-08-14 NOTE — Plan of Care (Signed)
Correction to prior note - per d/w MD, OK to make cardiology f/u 6 weeks out - appt for 6/23 scheduled and placed on AVS. Needs close f/u with PCP after dc for labs recommended in MD note.

## 2020-08-14 NOTE — Discharge Instructions (Signed)
Your EKG showed a prolonged QT interval. Whenever you are prescribed any new medicines by another provider, please make sure to mention this to them. Some medications can potentially cause interactions that affect your heart.

## 2020-08-14 NOTE — Care Management Important Message (Signed)
Important Message  Patient Details  Name: Tina Kennedy MRN: [id number] Date of Birth: 1950/10/30   Medicare Important Message Given:  Yes     Shelda Altes 08/14/2020, 10:14 AM

## 2020-08-14 NOTE — Progress Notes (Addendum)
Progress Note  Patient Name: Tina Kennedy Date of Encounter: 08/14/2020  Primary Cardiologist: Elouise Munroe, MD  Subjective   Feeling much better today, no chest pain or SOB, no edema. Abdominal pain resolved. No palpitations. No family hx of SCD.  Inpatient Medications    Scheduled Meds: . enoxaparin (LOVENOX) injection  30 mg Subcutaneous Q24H  . feeding supplement  237 mL Oral TID BM  . metoprolol succinate  12.5 mg Oral Daily  . multivitamin with minerals  1 tablet Oral Daily  . pantoprazole  40 mg Oral QHS  . polyethylene glycol  17 g Oral Daily  . senna-docusate  2 tablet Oral BID  . sodium chloride flush  3 mL Intravenous Q12H   Continuous Infusions: . sodium chloride 20 mL/hr at 08/11/20 1335  . sodium chloride     PRN Meds: sodium chloride, acetaminophen, HYDROmorphone (DILAUDID) injection, LORazepam, oxyCODONE-acetaminophen, sodium chloride flush, traZODone   Vital Signs    Vitals:   08/13/20 1706 08/13/20 2040 08/14/20 0512 08/14/20 0912  BP: (!) 150/92 125/80 (!) 129/96 132/87  Pulse: 74 64 71 83  Resp:   16   Temp:  98.9 F (37.2 C) 98.7 F (37.1 C)   TempSrc:  Oral Oral   SpO2:  98% 96%   Weight:      Height:        Intake/Output Summary (Last 24 hours) at 08/14/2020 1017 Last data filed at 08/13/2020 2000 Gross per 24 hour  Intake 1230 ml  Output --  Net 1230 ml   Last 3 Weights 08/13/2020 08/12/2020 08/11/2020  Weight (lbs) 91 lb 88 lb 9.6 oz 92 lb 9.5 oz  Weight (kg) 41.277 kg 40.189 kg 42 kg     Telemetry    NSR, prolonged QT - Personally Reviewed  ECG    08/13/20 - NSR with short PR interval and markedly prolonged QT, deep TWI inferiorly and V4-V6  - Personally Reviewed  Physical Exam   GEN: No acute distress, chronically ill appearing. HEENT: Normocephalic, atraumatic, sclera non-icteric. Neck: No JVD or bruits. Cardiac: RRR no murmurs, rubs, or gallops.  Respiratory: Clear to auscultation bilaterally. Breathing  is unlabored. GI: Soft, nontender, non-distended, BS +x 4. MS: no deformity. Extremities: No clubbing or cyanosis. No edema. Distal pedal pulses are 2+ and equal bilaterally. Right radial cath site without hematoma or ecchymosis; good pulse. Neuro:  AAOx3. Follows commands. Psych:  Responds to questions appropriately with a normal affect.  Labs    High Sensitivity Troponin:   Recent Labs  Lab 08/11/20 1328 08/11/20 1504 08/11/20 2323 08/12/20 0248 08/12/20 0523  TROPONINIHS 1,016* 1,197* 1,454* 1,805* 1,463*      Cardiac EnzymesNo results for input(s): TROPONINI in the last 168 hours. No results for input(s): TROPIPOC in the last 168 hours.   Chemistry Recent Labs  Lab 08/11/20 1328 08/11/20 1504 08/11/20 1534 08/11/20 2323 08/12/20 0755 08/12/20 1024 08/12/20 1556 08/12/20 1846 08/13/20 0353 08/13/20 0735 08/13/20 0819  NA 123* 128* 127*  --  131*  --   --   --   --   --  129*  K >7.5* 4.5 4.3   < > 4.2   < > 2.9*   < > 4.1 3.9 4.0  CL 88* 92* 91*  --  96*  --   --   --   --   --  96*  CO2 24 19*  --   --  25  --   --   --   --   --  21*  GLUCOSE 121* 119* 305*  --  105*  --   --   --   --   --  62*  BUN 11 11 10   --  10  --   --   --   --   --  8  CREATININE 1.66* 1.28* 1.10*  --  1.09*  --   --   --   --   --  0.91  CALCIUM 8.8* 9.2  --   --  8.6*  --   --   --   --   --  7.5*  PROT 7.5  --   --   --   --   --  6.1*  --   --   --   --   ALBUMIN 3.7  --   --   --   --   --  2.8*  --   --   --   --   AST 85*  --   --   --   --   --  31  --   --   --   --   ALT 38  --   --   --   --   --  11  --   --   --   --   ALKPHOS 82  --   --   --   --   --  71  --   --   --   --   BILITOT 1.7*  --   --   --   --   --  0.9  --   --   --   --   GFRNONAA 33* 46*  --   --  55*  --   --   --   --   --  >60  ANIONGAP 11 17*  --   --  10  --   --   --   --   --  12   < > = values in this interval not displayed.     Hematology Recent Labs  Lab 08/12/20 0248 08/13/20 1012  08/14/20 0311  WBC 6.1 3.2* 2.5*  RBC 5.18* 4.79 4.44  HGB 16.2* 15.2* 14.1  HCT 44.5 41.9 38.9  MCV 85.9 87.5 87.6  MCH 31.3 31.7 31.8  MCHC 36.4* 36.3* 36.2*  RDW 12.3 12.7 12.7  PLT 259 168 150    BNPNo results for input(s): BNP, PROBNP in the last 168 hours.   DDimer No results for input(s): DDIMER in the last 168 hours.   Radiology    CT ABDOMEN PELVIS W CONTRAST  Result Date: 08/12/2020 CLINICAL DATA:  Acute nonlocalized abdominal pain EXAM: CT ABDOMEN AND PELVIS WITH CONTRAST TECHNIQUE: Multidetector CT imaging of the abdomen and pelvis was performed using the standard protocol following bolus administration of intravenous contrast. CONTRAST:  80mL OMNIPAQUE IOHEXOL 300 MG/ML  SOLN COMPARISON:  07/21/2020 FINDINGS: Lower chest: No acute abnormality. Hepatobiliary: The gallbladder is distended without pericholecystic inflammatory change identified. The liver is unremarkable. No intra or extrahepatic biliary ductal dilation. Pancreas: Unremarkable Spleen: Unremarkable Adrenals/Urinary Tract: Adrenal glands are unremarkable. Kidneys are normal, without renal calculi, focal lesion, or hydronephrosis. Bladder is unremarkable. Stomach/Bowel: Previously noted gastrostomy catheter has been removed. Moderate stool is seen throughout the colon. Large volume stool is again seen within the rectal vault with stable stable mild circumferential thickening of the gastric antrum and pylorus. The stomach, small bowel, and large bowel are otherwise  unremarkable. No evidence of obstruction. No free intraperitoneal gas or fluid. Mild circumferential rectal wall thickening in keeping with mild proctitis. Vascular/Lymphatic: There is extensive aortoiliac atherosclerotic calcification. No aortic aneurysm. No pathologic adenopathy within the abdomen and pelvis. Reproductive: Uterus absent. Stable left adnexal cyst versus peritoneal inclusion cyst measuring 1.6 x 3.6 cm. This was not metabolically active on prior  PET CT examination of 11/07/2019. Other: Mild diffuse body wall subcutaneous edema is again identified in keeping with mild anasarca. Musculoskeletal: No acute bone abnormality. No lytic or blastic bone lesion. IMPRESSION: Large stool again identified within the rectal vault with mild circumferential rectal wall thickening possibly reflecting changes of stercoral proctitis. Moderate stool throughout the colon. Stable left pelvic adnexal versus peritoneal inclusion cyst, metabolically quiescent on prior PET CT examination of 11/07/2019. Aortic Atherosclerosis (ICD10-I70.0). Electronically Signed   By: Fidela Salisbury MD   On: 08/12/2020 23:32   CARDIAC CATHETERIZATION  Result Date: 08/13/2020 Conclusions: 1. Mild, non-obstructive coronary artery disease with mild plaquing of the proximal LCx and RCA. 2. Low normal left ventricular systolic function with apical wall motion abnormality consistent with stress-induced cardiomyopathy. 3. Normal left ventricular filling pressure. Recommendations: 1. Medical therapy for stress-induced cardiomyopathy.  I will add metoprolol succinate 12.5 mg daily.  Consider adding ACEI/ARB prior to discharge if blood pressure and renal function allow. 2. Primary prevention of coronary artery disease. Nelva Bush, MD [facility]    Cardiac Studies   [facility] 08/13/20 Conclusions: 1. Mild, non-obstructive coronary artery disease with mild plaquing of the proximal LCx and RCA. 2. Low normal left ventricular systolic function with apical wall motion abnormality consistent with stress-induced cardiomyopathy. 3. Normal left ventricular filling pressure. Recommendations: 1. Medical therapy for stress-induced cardiomyopathy.  I will add metoprolol succinate 12.5 mg daily.  Consider adding ACEI/ARB prior to discharge if blood pressure and renal function allow. 2. Primary prevention of coronary artery disease. Nelva Bush, MD [facility]  2D echo 08/11/20 1. Left ventricular  ejection fraction, by estimation, is 60 to 65%. The  left ventricle has normal function. Left ventricular endocardial border  not optimally defined to evaluate regional wall motion though no wall  motion abnormalities seen in  foreshortened views. Left ventricular diastolic parameters are consistent  with Grade I diastolic dysfunction (impaired relaxation).  2. Right ventricular systolic function is normal. The right ventricular  size is normal.  3. The mitral valve is grossly normal. Trivial mitral valve  regurgitation. No evidence of mitral stenosis.  4. The aortic valve is grossly normal. Aortic valve regurgitation is not  visualized. No aortic stenosis is present.  5. The inferior vena cava is normal in size with greater than 50%  respiratory variability, suggesting right atrial pressure of 3 mmHg.  Comparison(s): No prior Echocardiogram.    Patient Profile     70 y.o. 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 Other issues include hyperkalemia on admission 7.5 (?spurious) with subsequent hypokalemia, AKI, constipation. Cardiology following for elevated troponin (peak 1805) with mild CAD by cath.  Assessment & Plan    1. Abdominal pain - per IM note, etiology not clear for abdominal pain - possible gastritis; CT abd/pelvis with contrast showed large stool again identified within the rectal vault with mild circumferential rectal  wall thickening possibly reflecting changes of stercoral proctitis - resolved  2. Elevated troponin possibly due to demand ischemia / stress-induced cardiomyopathy - peak troponin value 1805 - 2D echo 5/3 showed EF 60-65%, grade 1  DD - LHC 5/5 showed mild nonobstructive CAD with mild plaque of the prox LCx and RCA, low normal LV systolic function with apical wall motion c/w stress induced cardiomyopathy with normal LV filling pressure - cannot fully exclude myocarditis picture although she is not having any residual symptoms and LVEF is preserved. Seems temporally further out than expected for relationship to Covid-19 in April. Per d/w MD, further imaging would not change management so continue medical therapy - Toprol added - will discuss addition of ACEi/ARB with MD per cath note  3. QT prolongation (dramatic) - EKG last month 07/2020 was normal so this is likely acute rather than a congenital issue, possibly due to Takotsubo cardiomyopathy - stop PRN trazodone - would avoid adding additional QT prolonging agents - repeat EKG today - add magnesium to labs - also pending BMET/TSH today - will defer electrolyte management to primary team as below but would aim to keep normal values of potassium (K 4.0 or greater), magnesium (2.0 or greater), and calcium - discussed finding with patient and added FYI to AVS to discuss with anyone prescribing medication in the future  4. Electrolyte abnormalities - hyponatremia, reported to be chronic, per IM - hyperkalemia of 7.5 on arrival, possible spurious value as she had subsequent hypokalemia then treated with subsequent improvement/normalization - hypocalcemia, per medical team  Remainder of medical problems per primary team.  Tentatively arranged f/u 6/3, appt placed on AVS. Recommend to await repeat EKG/labs and MD recommendations prior to dc.  For questions or updates, please contact [place] Please consult [email or web address] for contact info under Cardiology/STEMI.  Signed, Charlie Pitter, PA-C 08/14/2020, 10:17 AM    Patient seen and examined with Melina Copa PA-C.  Agree as above, with the following exceptions and changes as noted below. Feels much better. "I feel like I  can dance today!" No CP or SOB, no abd pain. Gen: NAD, CV: RRR, no murmurs, Lungs: clear, Abd: soft, Extrem: Warm, well perfused, no edema, Neuro/Psych: alert and oriented x 3, normal mood and affect. All available labs, radiology testing, previous records reviewed. Likely stress CM, BB has been added. Will add low dose losartan. Needs labs in 3-5 days as outpatient.  Needs follow up in cardiology, we will arrange in 6 weeks.  Cardiology will sign off at this time, contact our service with any concerns.   Elouise Munroe, MD 08/14/20 11:56 AM

## 2020-08-14 NOTE — Evaluation (Signed)
Clinical/Bedside Swallow Evaluation Patient Details  Name: Tina Kennedy MRN: [id number] Date of Birth: [birth year]  Today's Date: 08/14/2020 Time: SLP Start Time (ACUTE ONLY): 1025 SLP Stop Time (ACUTE ONLY): 1045 SLP Time Calculation (min) (ACUTE ONLY): 20 min  Past Medical History:  Past Medical History:  Diagnosis Date  . Hypopharyngeal cancer ([place])   . Prolonged QT interval    Past Surgical History:  Past Surgical History:  Procedure Laterality Date  . DIRECT LARYNGOSCOPY N/A 10/15/2019   Procedure: DIRECT LARYNGOSCOPY w/BIOPSY;  Surgeon: Izora Gala, MD;  Location: [place];  Service: ENT;  Laterality: N/A;  . GASTROSTOMY N/A 12/04/2019   Procedure: LAPAROSCOPIC G TUBE PLACEMENT;  Surgeon: Ralene Ok, MD;  Location: [facility];  Service: General;  Laterality: N/A;  . IR GASTROSTOMY TUBE MOD SED  11/15/2019  . IR IMAGING GUIDED PORT INSERTION  11/15/2019  . IR REPLC GASTRO/COLONIC TUBE PERCUT W/FLUORO  07/20/2020  . LEFT HEART CATH AND CORONARY ANGIOGRAPHY N/A 08/13/2020   Procedure: LEFT HEART CATH AND CORONARY ANGIOGRAPHY;  Surgeon: Nelva Bush, MD;  Location: [place] [facility];  Service: Cardiovascular;  Laterality: N/A;  . RIGID ESOPHAGOSCOPY N/A 10/15/2019   Procedure: RIGID ESOPHAGOSCOPY;  Surgeon: Izora Gala, MD;  Location: [place];  Service: ENT;  Laterality: N/A;   HPI:  70yo female admitted 08/11/20 with cough and chest pain. Recent admit 07/21/20 for intractible nausea, vomiting, abdominal pain. PMH: hypopharyngeal cancer (dx 10/2019) s/p PEG tube, COVID, GERD. CXR - no active pulmonary disease. Left heart cath and coronary angiography/NSTEMI 08/13/20. MBS 08/27/19 - silent pen/asp thin, pen NTL, improved with left head turn   Assessment / Plan / Recommendation Clinical Impression  Pt was seen at bedside for assessment of swallow function and safety. 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 She had outpatient speech  therapy for dysphagia therapy until January 2022. Pt's PEG tube has been removed. Today, pt presents with adequate oral motor strength and function. No overt s/s aspiration observed or reported on puree and very soft solids (canned peaches). Pt reports she is unable to manage hard or dry solids without her dentures, which are not available at this time. Will continue current diet (dys3/thin). Pt is aware of consistencies she can handle, and indicates she performs the left head turn (per MBS 2021) "when (she) remembers it". Pt was encouraged to continue to complete home exercise program BID given potential ongoing effects of radiation. Pt reports odynophagia on the right side, which concerns her as well. Recommend follow up with outpatient speech therapy for dysphagia treatment. MD and OP SLP informed.   SLP Visit Diagnosis: Dysphagia, unspecified (R13.10)    Aspiration Risk  Mild aspiration risk;Risk for inadequate nutrition/hydration    Diet Recommendation Dysphagia 3 (Mech soft);Thin liquid   Liquid Administration via: Cup;Straw Medication Administration:  (as tolerated) Supervision: Patient able to self feed Compensations: Slow rate;Small sips/bites;Minimize environmental distractions    Other  Recommendations Oral Care Recommendations: Oral care BID   Follow up Recommendations Outpatient SLP          Prognosis Prognosis for Safe Diet Advancement: Fair Barriers to Reach Goals: Other (Comment) (radiation)      Swallow Study   General Date of Onset: 08/11/20 HPI: 70yo female admitted 08/11/20 with cough and chest pain. Recent admit 07/21/20 for intractible nausea, vomiting, abdominal pain. PMH: hypopharyngeal cancer (dx 10/2019) s/p PEG tube, COVID, GERD. CXR - no active pulmonary disease. Left heart cath and coronary  angiography/NSTEMI 08/13/20. MBS 08/27/19 - silent pen/asp thin, pen NTL, improved with left head turn Type of Study: Bedside Swallow Evaluation Previous Swallow Assessment: MBS  08/27/19 - silent pen/asp thin, pen NTL, improved with left head turn Diet Prior to this Study: Dysphagia 3 (soft);Thin liquids Temperature Spikes Noted: No Respiratory Status: Room air History of Recent Intubation: No Behavior/Cognition: Alert;Cooperative;Pleasant mood Oral Cavity Assessment: Within Functional Limits Oral Care Completed by SLP: No Oral Cavity - Dentition: Edentulous;Dentures, not available Vision: Functional for self-feeding Self-Feeding Abilities: Able to feed self Patient Positioning: Upright in bed Baseline Vocal Quality: Normal Volitional Cough: Strong Volitional Swallow: Able to elicit    Oral/Motor/Sensory Function Overall Oral Motor/Sensory Function: Within functional limits   Ice Chips Ice chips: Not tested   Thin Liquid Thin Liquid: Within functional limits Presentation: Straw    Nectar Thick Nectar Thick Liquid: Not tested   Honey Thick Honey Thick Liquid: Not tested   Puree Puree: Within functional limits Presentation: Self Fed;Spoon   Solid     Solid: Impaired Oral Phase Impairments: Other (comment) (Pt reports she is unable to handle solid textures without dentures.)     Wahid Holley B. Quentin Ore, [facility], [place] Speech Language Pathologist Office: [phone] Pager: [phone]  Shonna Chock 08/14/2020,10:57 AM

## 2020-08-14 NOTE — Progress Notes (Signed)
Physical Therapy Evaluation and discharge Patient Details Name: Tina Kennedy MRN: [id number] DOB: 08-26-1950 Today's Date: 08/14/2020   History of Present Illness  Pt is a 70 y/o female admitted 5/3 secondary to chest pain, weakness, and vomiting. Found to have NSTEMI and is s/p cardiac cath that showed mild nonobstructive CAD. PMH includes hypopharyngeal CA, GERD, and dysphagia.  Clinical Impression  Patient evaluated by Physical Therapy with no further acute PT needs identified. All education has been completed and the patient has no further questions. Pt overall steady with gait and stair navigation. Overall at an independent to mod I level. Scored 22 on DGI which indicates low fall risk. Pt reports her sisters can assist if needed. See below for any follow-up Physical Therapy or equipment needs. PT is signing off. Thank you for this referral. If needs change, please re-consult.      Follow Up Recommendations No PT follow up    Equipment Recommendations  None recommended by PT    Recommendations for Other Services       Precautions / Restrictions Precautions Precautions: Fall Restrictions Weight Bearing Restrictions: No      Mobility  Bed Mobility Overal bed mobility: Independent                  Transfers Overall transfer level: Independent                  Ambulation/Gait Ambulation/Gait assistance: Independent Gait Distance (Feet): 200 Feet Assistive device: None Gait Pattern/deviations: WFL(Within Functional Limits) Gait velocity: WFL   General Gait Details: Overall steady gait with good gait speed. No LOB noted when performing dynamic gait tasks.  Stairs Stairs: Yes Stairs assistance: Modified independent (Device/Increase time) Stair Management: One rail Right;Forwards;Alternating pattern Number of Stairs: 3 General stair comments: No LOB noted with stair navigation.  Wheelchair Mobility    Modified Rankin (Stroke Patients  Only)       Balance Overall balance assessment: Modified Independent                               Standardized Balance Assessment Standardized Balance Assessment : Dynamic Gait Index   Dynamic Gait Index Level Surface: Normal Change in Gait Speed: Normal Gait with Horizontal Head Turns: Normal Gait with Vertical Head Turns: Normal Gait and Pivot Turn: Normal Step Over Obstacle: Normal Step Around Obstacles: Mild Impairment Steps: Mild Impairment Total Score: 22       Pertinent Vitals/Pain Pain Assessment: No/denies pain    Home Living Family/patient expects to be discharged to:: Private residence Living Arrangements: Alone Available Help at Discharge: Family Type of Home: House Home Access: Stairs to enter Entrance Stairs-Rails: Right;Left;Can reach both Technical brewer of Steps: 3 Home Layout: One level Home Equipment: None      Prior Function Level of Independence: Independent               Hand Dominance   Dominant Hand: Right    Extremity/Trunk Assessment   Upper Extremity Assessment Upper Extremity Assessment: Overall WFL for tasks assessed    Lower Extremity Assessment Lower Extremity Assessment: Overall WFL for tasks assessed    Cervical / Trunk Assessment Cervical / Trunk Assessment: Normal  Communication   Communication: No difficulties  Cognition Arousal/Alertness: Awake/alert Behavior During Therapy: WFL for tasks assessed/performed Overall Cognitive Status: Within Functional Limits for tasks assessed  General Comments      Exercises     Assessment/Plan    PT Assessment Patent does not need any further PT services  PT Problem List         PT Treatment Interventions      PT Goals (Current goals can be found in the Care Plan section)  Acute Rehab PT Goals Patient Stated Goal: to go home PT Goal Formulation: With patient Time For Goal  Achievement: 08/14/20 Potential to Achieve Goals: Good    Frequency     Barriers to discharge        Co-evaluation               AM-PAC PT "6 Clicks" Mobility  Outcome Measure Help needed turning from your back to your side while in a flat bed without using bedrails?: None Help needed moving from lying on your back to sitting on the side of a flat bed without using bedrails?: None Help needed moving to and from a bed to a chair (including a wheelchair)?: None Help needed standing up from a chair using your arms (e.g., wheelchair or bedside chair)?: None Help needed to walk in hospital room?: None Help needed climbing 3-5 steps with a railing? : None 6 Click Score: 24    End of Session Equipment Utilized During Treatment: Gait belt Activity Tolerance: Patient tolerated treatment well Patient left: in bed;with call bell/phone within reach Nurse Communication: Mobility status PT Visit Diagnosis: Other abnormalities of gait and mobility (R26.89)    Time: 9629-5284 PT Time Calculation (min) (ACUTE ONLY): 15 min   Charges:   PT Evaluation $PT Eval Low Complexity: 1 Low          Tina Kennedy, DPT  Acute Rehabilitation Services  Pager: [phone] Office: [phone]   Tina Kennedy 08/14/2020, 1:11 PM

## 2020-08-14 NOTE — Discharge Summary (Signed)
Physician Discharge Summary  Tina [place] ZOX:[id number] DOB: 07-31-1950 DOA: 08/11/2020  PCP: Patient, No Pcp Per (Inactive)  Admit date: 08/11/2020 Discharge date: 08/14/2020  Admitted From: Home.  Disposition:  Home.   Recommendations for Outpatient Follow-up:  1. Follow up with PCP in 1-2 weeks 2. Please obtain BMP/CBC in one week Please follow up with cardiology as recommended.   Discharge Condition:stable.  CODE STATUS: full code.  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 Presents with chest pain and cough. She was found to have elevated troponins and cardiology consulted.   Discharge Diagnoses:  Active Problems:   Hypopharyngeal cancer (HCC)   NSTEMI (non-ST elevated myocardial infarction) (HCC)   AKI (acute kidney injury) ([facility])   Hyponatremia   Hypokalemia   Abdominal pain   Protein-calorie malnutrition, severe   Prolonged Q-T interval on ECG   Abdominal pain with some nausea:  Unclear etiology. Differential include gastritis. No diarrhea or vomiting. Generalized / epigastric abd pain.  Elevated AST and bilirubin. Rpt liver enzymes, lipase within normal limits. Patient afebrile and WBC count within normal limits. Lactic acid wnl.  She was started on IV PPI transitioned to oral PPI.  CT abd and pelvis with contrast showed Large stool again identified within the rectal vault with mild circumferential rectal wall thickening possibly reflecting changes of stercoral proctitis. Moderate stool throughout the colon. She was given laxatives and suppository, she had bowel movement and her abd pain has resolved.   AKI:  Probably pre renal , resolved with IV fluids.   Constipation; Stool softeners, laxatives and suppository ordered.    Hypokalemia and hypomagnesemia Replaced.  Repeat levels wnl.   NSTEMI;  Demand ischemia/ leading to elevated troponin. Cardiology on board and is recommended  IV heparin and LHC.  Lipid panel reviewed.  Echocardiogram Left ventricular ejection fraction, by estimation, is 60 to 65%, with  normal function. Left ventricular endocardial border  not optimally defined to evaluate regional wall motion though no wall motion abnormalities seen in  foreshortened views. Left ventricular diastolic parameters are consistent  with Grade I diastolic dysfunction (impaired relaxation. LHC 5/5 showed mild nonobstructive CAD with mild plaque of the prox LCx and RCA, low normal LV systolic function with apical wall motion c/w stress induced cardiomyopathy with normal LV filling pressure   Chronic hyponatremia:  Sodium at baseline between 127 to 131.  Continue to monitor as outpatient and follow up with BMP in one week.    H/p dysphagia with PEG placement which has come out.  Pt reports taking soft diet at home. SLP eval ordered.    Severe malnutrition due to chronic illness.  Dietary on board.  Supplementation ordered.   Hypopharyngeal squamous cell ca s/p chemoradiation.  Follows up with Dr Alen Blew as outpatient.   Qt prolongation:  Improved.  Keep K > 4 and MG>2.     Discharge Instructions  Discharge Instructions    Diet - low sodium heart healthy   Complete by: As directed    Discharge instructions   Complete by: As directed    Please follow up with PCP and get cbc and BMP in 2 to 3 days.     Allergies as of 08/14/2020   No Known Allergies     Medication List    TAKE these medications   ascorbic acid 500 MG  tablet Commonly known as: VITAMIN C Take 1 tablet (500 mg total) by mouth daily.   feeding supplement Liqd Take 237 mLs by mouth 3 (three) times daily between meals. What changed:   how much to take  how to take this  when to take this  additional  instructions  Another medication with the same name was removed. Continue taking this medication, and follow the directions you see here.   losartan 25 MG tablet Commonly known as: COZAAR Take 1 tablet (25 mg total) by mouth daily. Start taking on: Aug 15, 2020   metoprolol succinate 25 MG 24 hr tablet Commonly known as: TOPROL-XL Take 0.5 tablets (12.5 mg total) by mouth daily. Start taking on: Aug 15, 2020   multivitamin with minerals Tabs tablet Take 1 tablet by mouth daily.   pantoprazole 40 MG tablet Commonly known as: PROTONIX Take 40 mg by mouth 2 (two) times daily.   polyethylene glycol 17 g packet Commonly known as: MIRALAX / GLYCOLAX Take 17 g by mouth daily. Start taking on: Aug 15, 2020   senna-docusate 8.6-50 MG tablet Commonly known as: Senokot-S Take 1 tablet by mouth 2 (two) times daily.   zinc sulfate 220 (50 Zn) MG capsule Take 1 capsule (220 mg total) by mouth daily.       Follow-up Information    Molli Hazard Thomasene Ripple, NP Follow up.   Specialty: Cardiology Why: [facility] (Cardiology) - [facility] location - follow-up on Thursday Oct 01, 2020 1:45 PM (Arrive by 1:30 PM). Verdon Cummins is one of our nurse practitioners that works with our cardiology team. Contact information: [address] [place] [address] [phone]        Parke Poisson, MD Follow up in 3 day(s).   Specialties: Cardiology, Radiology Why: labs CBC, BMP/  Contact information: [address] [place] [address] [phone]              No Known Allergies  Consultations:  CARDIOLOGY.    Procedures/Studies: DG Chest 2 View  Result Date: 08/11/2020 CLINICAL DATA:  Chest pain, weakness and vomiting since this morning. EXAM: CHEST - 2 VIEW COMPARISON:  07/23/2018 FINDINGS: Cardiac silhouette is normal in size. No mediastinal or hilar masses or evidence of adenopathy. Right anterior chest wall Port-A-Cath extends through the internal jugular  vein, tip in the mid superior vena cava, stable. Lungs are hyperexpanded, but clear. No pleural effusion or pneumothorax. Skeletal structures are demineralized but intact. IMPRESSION: 1. No acute cardiopulmonary disease. Electronically Signed   By: Amie Portland M.D.   On: 08/11/2020 14:14   CT ABDOMEN PELVIS W CONTRAST  Result Date: 08/12/2020 CLINICAL DATA:  Acute nonlocalized abdominal pain EXAM: CT ABDOMEN AND PELVIS WITH CONTRAST TECHNIQUE: Multidetector CT imaging of the abdomen and pelvis was performed using the standard protocol following bolus administration of intravenous contrast. CONTRAST:  36mL OMNIPAQUE IOHEXOL 300 MG/ML  SOLN COMPARISON:  07/21/2020 FINDINGS: Lower chest: No acute abnormality. Hepatobiliary: The gallbladder is distended without pericholecystic inflammatory change identified. The liver is unremarkable. No intra or extrahepatic biliary ductal dilation. Pancreas: Unremarkable Spleen: Unremarkable Adrenals/Urinary Tract: Adrenal glands are unremarkable. Kidneys are normal, without renal calculi, focal lesion, or hydronephrosis. Bladder is unremarkable. Stomach/Bowel: Previously noted gastrostomy catheter has been removed. Moderate stool is seen throughout the colon. Large volume stool is again seen within the rectal vault with stable stable mild circumferential thickening of the gastric antrum and pylorus. The stomach, small bowel, and large bowel are otherwise unremarkable.  No evidence of obstruction. No free intraperitoneal gas or fluid. Mild circumferential rectal wall thickening in keeping with mild proctitis. Vascular/Lymphatic: There is extensive aortoiliac atherosclerotic calcification. No aortic aneurysm. No pathologic adenopathy within the abdomen and pelvis. Reproductive: Uterus absent. Stable left adnexal cyst versus peritoneal inclusion cyst measuring 1.6 x 3.6 cm. This was not metabolically active on prior PET CT examination of 11/07/2019. Other: Mild diffuse body wall  subcutaneous edema is again identified in keeping with mild anasarca. Musculoskeletal: No acute bone abnormality. No lytic or blastic bone lesion. IMPRESSION: Large stool again identified within the rectal vault with mild circumferential rectal wall thickening possibly reflecting changes of stercoral proctitis. Moderate stool throughout the colon. Stable left pelvic adnexal versus peritoneal inclusion cyst, metabolically quiescent on prior PET CT examination of 11/07/2019. Aortic Atherosclerosis (ICD10-I70.0). Electronically Signed   By: Fidela Salisbury MD   On: 08/12/2020 23:32   CT ABDOMEN PELVIS W CONTRAST  Result Date: 07/21/2020 CLINICAL DATA:  Abdominal pain EXAM: CT ABDOMEN AND PELVIS WITH CONTRAST TECHNIQUE: Multidetector CT imaging of the abdomen and pelvis was performed using the standard protocol following bolus administration of intravenous contrast. CONTRAST:  79mL OMNIPAQUE IOHEXOL 300 MG/ML  SOLN COMPARISON:  PET-CT April 08, 2020 FINDINGS: Lower chest: Lung bases are clear.  There is a small hiatal hernia. Hepatobiliary: No focal liver lesions are appreciable. Gallbladder appears borderline distended without appreciable gallbladder wall thickening. There is no appreciable biliary duct dilatation. Pancreas: There is no pancreatic mass or inflammatory focus. Spleen: No splenic lesions are evident. Adrenals/Urinary Tract: Adrenals bilaterally appear unremarkable. There is an extrarenal pelvis on each side, an anatomic variant. There is no renal mass or hydronephrosis on either side. There is a junctional parenchymal defect on the left, an anatomic variant. There is no evident renal or ureteral calculus on either side. Urinary bladder is midline with wall thickness within normal limits. Urinary bladder is distended. Stomach/Bowel: The rectum and distal sigmoid colon are distended with stool. There is perirectal wall thickening but no soft tissue stranding or fluid in the perirectal region. There is  a degree of relatively mild rectal prolapse. Elsewhere, there is no appreciable bowel wall or mesenteric thickening. There is a gastrostomy catheter positioned in the stomach. There is fold thickening in the gastric antral region without ulceration seen by CT. There is no demonstrable bowel obstruction. The terminal ileum appears normal. There is no evident appendiceal region inflammation. No evident free air or portal venous air. Vascular/Lymphatic: No abdominal aortic aneurysm. There is aortic and iliac artery atherosclerosis. Major venous structures appear patent. No evident adenopathy in the abdomen or pelvis. Reproductive: Uterus absent.  No adnexal masses are evident. Other: No abscess or ascites is appreciable in the abdomen or pelvis. Musculoskeletal: No blastic or lytic bone lesions. No evident abdominal wall or intramuscular lesions. IMPRESSION: 1. Diffuse distension of the distal sigmoid colon and rectum with stool. No perirectal wall thickening without soft tissue stranding or abscess. No perirectal fluid. There may be a degree of stercoral colitis/proctitis. There is a degree of rectal prolapse. 2. Urinary bladder somewhat distended without urinary bladder wall thickening. 3. Gastrostomy catheter positioned in the stomach. Wall thickening in the distal stomach felt to represent a degree of antral gastritis. 4. No evident small bowel obstruction. No abscess in the abdomen or pelvis. 5.  No evident renal or ureteral calculus.  No hydronephrosis. 6.  Small hiatal hernia. 7.  Apparent absence of the uterus. 8.  Aortic Atherosclerosis (ICD10-I70.0). 9. Gallbladder borderline  prominent in size without wall thickening. No biliary duct dilatation appreciable by CT. Electronically Signed   By: Lowella Grip III M.D.   On: 07/21/2020 14:06   CARDIAC CATHETERIZATION  Result Date: 08/13/2020 Conclusions: 1. Mild, non-obstructive coronary artery disease with mild plaquing of the proximal LCx and RCA. 2. Low  normal left ventricular systolic function with apical wall motion abnormality consistent with stress-induced cardiomyopathy. 3. Normal left ventricular filling pressure. Recommendations: 1. Medical therapy for stress-induced cardiomyopathy.  I will add metoprolol succinate 12.5 mg daily.  Consider adding ACEI/ARB prior to discharge if blood pressure and renal function allow. 2. Primary prevention of coronary artery disease. Nelva Bush, MD [facility]   IR Replc Gastro/Colonic Tube Percut W/Fluoro  Result Date: 07/20/2020 INDICATION: 70 year old with history of hypopharyngeal cancer and history of laparoscopically placed gastrostomy tube. Gastrostomy tube was recently dislodged and patient needs tube replacement. EXAM: REPLACEMENT OF GASTROSTOMY TUBE WITH FLUOROSCOPY MEDICATIONS: Viscous lidocaine ANESTHESIA/SEDATION: Viscous lidocaine CONTRAST:  10 mL Omnipaque 300-administered into the gastric lumen. FLUOROSCOPY TIME:  Fluoroscopy Time: 1 minutes, 30 seconds, 6 mGy COMPLICATIONS: None immediate. PROCEDURE: Informed written consent was obtained from the patient after a thorough discussion of the procedural risks, benefits and alternatives. All questions were addressed. A timeout was performed prior to the initiation of the procedure. Initially the interventional radiology technologist tried to advance a new tube through the existing hole but this was unsuccessful. Therefore, the anterior abdomen and the gastrostomy site were prepped and draped in sterile fashion. Maximal barrier sterile technique was utilized including caps, mask, sterile gowns, sterile gloves, sterile drape, hand hygiene and skin antiseptic. Five French catheter was advanced into the stomach using fluoroscopic guidance and contrast injection confirmed placement in the stomach. Super stiff Amplatz wire was placed in the stomach. Attempted to dilate the tract to 32 [place] but this was very uncomfortable for the patient. Therefore, the  tract was only dilated to 62 [place]. After dilatation, 16 French Entuit catheter was easily advanced over the wire. Balloon was inflated with 6 mL of saline. Contrast injection confirmed placement in the stomach. Catheter was flushed with saline. Fluoroscopic images were taken and saved for this procedure. IMPRESSION: Successful replacement of the percutaneous gastrostomy tube. Patient currently has a 41 French balloon retention gastrostomy tube. Electronically Signed   By: Markus Daft M.D.   On: 07/20/2020 12:58   Portable chest 1 View  Result Date: 07/22/2020 CLINICAL DATA:  70 year old female with shortness of breath. Positive COVID-19. History of head and neck squamous cell carcinoma. EXAM: PORTABLE CHEST 1 VIEW COMPARISON:  CT Abdomen and Pelvis and portable chest 07/21/2020. FINDINGS: Portable AP semi upright view at 0540 hours. Stable right chest power port. Lung volumes and mediastinal contours are stable and normal aside from mild hyperinflation. Allowing for portable technique the lungs are clear. No pneumothorax or pleural effusion. No acute osseous abnormality identified. IMPRESSION: Pulmonary hyperinflation.  No acute cardiopulmonary abnormality. Electronically Signed   By: Genevie Ann M.D.   On: 07/22/2020 06:05   DG Chest Port 1 View  Result Date: 07/21/2020 CLINICAL DATA:  Abdominal pain EXAM: PORTABLE CHEST 1 VIEW COMPARISON:  None. FINDINGS: Right Port-A-Cath in place with the tip in the SVC. Heart and mediastinal contours are within normal limits. No focal opacities or effusions. No acute bony abnormality. IMPRESSION: No active disease. Electronically Signed   By: Rolm Baptise M.D.   On: 07/21/2020 12:48   ECHOCARDIOGRAM COMPLETE  Result Date: 08/11/2020    ECHOCARDIOGRAM REPORT  Patient Name:   KAMDYNN FERNELIUS [facility] Date of Exam: 08/11/2020 Medical Rec #:  [id number]                      Height:       65.0 in Accession #:    [id number]                     Weight:       98.0 lb Date  of Birth:  Apr 26, 1950                      BSA:          1.461 m Patient Age:    4 years                       BP:           147/91 mmHg Patient Gender: F                              HR:           93 bpm. Exam Location:  Inpatient Procedure: 2D Echo STAT ECHO Indications:    NSTEMI  History:        Patient has no prior history of Echocardiogram examinations.  Sonographer:    Johny Chess Referring Phys: [id number] TYRONE A KYLE  Sonographer Comments: Technically difficult study due to poor echo windows. Image acquisition challenging due to respiratory motion. IMPRESSIONS  1. Left ventricular ejection fraction, by estimation, is 60 to 65%. The left ventricle has normal function. Left ventricular endocardial border not optimally defined to evaluate regional wall motion though no wall motion abnormalities seen in foreshortened views. Left ventricular diastolic parameters are consistent with Grade I diastolic dysfunction (impaired relaxation).  2. Right ventricular systolic function is normal. The right ventricular size is normal.  3. The mitral valve is grossly normal. Trivial mitral valve regurgitation. No evidence of mitral stenosis.  4. The aortic valve is grossly normal. Aortic valve regurgitation is not visualized. No aortic stenosis is present.  5. The inferior vena cava is normal in size with greater than 50% respiratory variability, suggesting right atrial pressure of 3 mmHg. Comparison(s): No prior Echocardiogram. FINDINGS  Left Ventricle: Left ventricular ejection fraction, by estimation, is 60 to 65%. The left ventricle has normal function. Left ventricular endocardial border not optimally defined to evaluate regional wall motion. The left ventricular internal cavity size was small. There is no left ventricular hypertrophy. Left ventricular diastolic parameters are consistent with Grade I diastolic dysfunction (impaired relaxation). Right Ventricle: The right ventricular size is normal. No increase in  right ventricular wall thickness. Right ventricular systolic function is normal. Left Atrium: Left atrial size was normal in size. Right Atrium: Right atrial size was not well visualized. Pericardium: Trivial pericardial effusion is present. Mitral Valve: The mitral valve is grossly normal. Trivial mitral valve regurgitation. No evidence of mitral valve stenosis. Tricuspid Valve: The tricuspid valve is grossly normal. Tricuspid valve regurgitation is not demonstrated. Aortic Valve: The aortic valve is grossly normal. Aortic valve regurgitation is not visualized. No aortic stenosis is present. Pulmonic Valve: The pulmonic valve was not well visualized. Pulmonic valve regurgitation is not visualized. No evidence of pulmonic stenosis. Aorta: The aortic root and ascending aorta are structurally normal, with no evidence of dilitation. Venous: The inferior vena cava is normal in size  with greater than 50% respiratory variability, suggesting right atrial pressure of 3 mmHg. IAS/Shunts: The atrial septum is grossly normal.  LEFT VENTRICLE PLAX 2D LVIDd:         3.70 cm  Diastology LVIDs:         2.45 cm  LV e' medial:    5.66 cm/s LV PW:         0.80 cm  LV E/e' medial:  9.2 LV IVS:        0.80 cm  LV e' lateral:   5.44 cm/s LVOT diam:     1.60 cm  LV E/e' lateral: 9.5 LV SV:         48 LV SV Index:   33 LVOT Area:     2.01 cm  RIGHT VENTRICLE             IVC RV S prime:     13.10 cm/s  IVC diam: 1.00 cm LEFT ATRIUM           Index LA diam:      2.30 cm 1.57 cm/m LA Vol (A4C): 18.7 ml 12.80 ml/m  AORTIC VALVE LVOT Vmax:   104.00 cm/s LVOT Vmean:  72.300 cm/s LVOT VTI:    0.238 m  AORTA Ao Root diam: 2.60 cm Ao Asc diam:  3.00 cm MITRAL VALVE MV Area (PHT): 1.81 cm     SHUNTS MV Decel Time: 419 msec     Systemic VTI:  0.24 m MV E velocity: 51.85 cm/s   Systemic Diam: 1.60 cm MV A velocity: 103.00 cm/s MV E/A ratio:  0.50 Rudean Haskell MD Electronically signed by Rudean Haskell MD Signature Date/Time:  08/11/2020/7:37:33 PM    Final        Subjective:  No new complaints, wants to go home.  Discharge Exam: Vitals:   08/14/20 0912 08/14/20 1119  BP: 132/87 (!) 134/92  Pulse: 83 76  Resp:  17  Temp:  98 F (36.7 C)  SpO2:  100%   Vitals:   08/13/20 2040 08/14/20 0512 08/14/20 0912 08/14/20 1119  BP: 125/80 (!) 129/96 132/87 (!) 134/92  Pulse: 64 71 83 76  Resp:  16  17  Temp: 98.9 F (37.2 C) 98.7 F (37.1 C)  98 F (36.7 C)  TempSrc: Oral Oral  Oral  SpO2: 98% 96%  100%  Weight:      Height:        General: Pt is alert, awake, not in acute distress Cardiovascular: RRR, S1/S2 +, no rubs, no gallops Respiratory: CTA bilaterally, no wheezing, no rhonchi Abdominal: Soft, NT, ND, bowel sounds + Extremities: no edema, no cyanosis    The results of significant diagnostics from this hospitalization (including imaging, microbiology, ancillary and laboratory) are listed below for reference.     Microbiology: No results found for this or any previous visit (from the past 240 hour(s)).   Labs: BNP (last 3 results) No results for input(s): BNP in the last 8760 hours. Basic Metabolic Panel: Recent Labs  Lab 08/11/20 1328 08/11/20 1504 08/11/20 1534 08/11/20 2323 08/12/20 0755 08/12/20 1024 08/12/20 2214 08/13/20 0353 08/13/20 0735 08/13/20 0819 08/14/20 1108  NA 123* 128* 127*  --  131*  --   --   --   --  129* 130*  K >7.5* 4.5 4.3   < > 4.2   < > 3.0* 4.1 3.9 4.0 3.6  CL 88* 92* 91*  --  96*  --   --   --   --  96* 101  CO2 24 19*  --   --  25  --   --   --   --  21* 22  GLUCOSE 121* 119* 305*  --  105*  --   --   --   --  62* 58*  BUN 11 11 10   --  10  --   --   --   --  8 6*  CREATININE 1.66* 1.28* 1.10*  --  1.09*  --   --   --   --  0.91 1.06*  CALCIUM 8.8* 9.2  --   --  8.6*  --   --   --   --  7.5* 7.5*  MG  --   --   --   --   --   --   --   --   --   --  1.6*   < > = values in this interval not displayed.   Liver Function Tests: Recent Labs  Lab  08/11/20 1328 08/12/20 1556  AST 85* 31  ALT 38 11  ALKPHOS 82 71  BILITOT 1.7* 0.9  PROT 7.5 6.1*  ALBUMIN 3.7 2.8*   Recent Labs  Lab 08/11/20 1328  LIPASE 40   No results for input(s): AMMONIA in the last 168 hours. CBC: Recent Labs  Lab 08/11/20 1329 08/11/20 1534 08/12/20 0248 08/13/20 1012 08/14/20 0311  WBC 4.6  --  6.1 3.2* 2.5*  HGB 17.6* 16.3* 16.2* 15.2* 14.1  HCT 47.4* 48.0* 44.5 41.9 38.9  MCV 84.8  --  85.9 87.5 87.6  PLT 258  --  259 168 150   Cardiac Enzymes: No results for input(s): CKTOTAL, CKMB, CKMBINDEX, TROPONINI in the last 168 hours. BNP: Invalid input(s): POCBNP CBG: Recent Labs  Lab 08/11/20 1650  GLUCAP 116*   D-Dimer No results for input(s): DDIMER in the last 72 hours. Hgb A1c No results for input(s): HGBA1C in the last 72 hours. Lipid Profile Recent Labs    08/11/20 2323  CHOL 187  HDL 113  LDLCALC 64  TRIG 50  CHOLHDL 1.7   Thyroid function studies No results for input(s): TSH, T4TOTAL, T3FREE, THYROIDAB in the last 72 hours.  Invalid input(s): FREET3 Anemia work up No results for input(s): VITAMINB12, FOLATE, FERRITIN, TIBC, IRON, RETICCTPCT in the last 72 hours. Urinalysis    Component Value Date/Time   BILIRUBINUR neg 12/24/2013 1653   PROTEINUR neg 12/24/2013 1653   UROBILINOGEN 1.0 12/24/2013 1653   NITRITE neg 12/24/2013 1653   LEUKOCYTESUR Trace 12/24/2013 1653   Sepsis Labs Invalid input(s): PROCALCITONIN,  WBC,  LACTICIDVEN Microbiology No results found for this or any previous visit (from the past 240 hour(s)).   Time coordinating discharge: 35 minutes.   SIGNED:   Hosie Poisson, MD  [facility] 08/14/2020, 3:23 PM

## 2020-08-18 ENCOUNTER — Inpatient Hospital Stay: Payer: Medicare Other | Attending: Oncology | Admitting: Nutrition

## 2020-08-19 ENCOUNTER — Inpatient Hospital Stay: Payer: Medicare Other

## 2020-08-20 ENCOUNTER — Encounter: Payer: Self-pay | Admitting: Nutrition

## 2020-08-20 ENCOUNTER — Telehealth: Payer: Self-pay | Admitting: Nutrition

## 2020-08-20 NOTE — Progress Notes (Signed)
Patient did not show up for appointment on May 10.

## 2020-08-20 NOTE — Telephone Encounter (Signed)
Scheduled appt per 5/12 sch msg. Called pt, no answer. Left msg with appt date and time.  

## 2020-08-25 ENCOUNTER — Inpatient Hospital Stay: Payer: Medicare Other | Admitting: Dietician

## 2020-09-11 ENCOUNTER — Ambulatory Visit: Payer: Medicare Other | Admitting: General Practice

## 2020-09-29 NOTE — Progress Notes (Deleted)
Cardiology Clinic Note   Patient Name: Tina Kennedy Date of Encounter: 09/29/2020  Primary Care Provider:  Patient, No Pcp Per (Inactive) Primary Cardiologist:  Elouise Munroe, MD  Patient [place] Tina Kennedy presents to the clinic today for follow-up evaluation of Tina coronary artery disease status post NSTEMI 08/13/2020 which showed mild nonobstructive CAD.  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[place])    Prolonged QT interval    Past Surgical History:  Procedure Laterality Date   DIRECT LARYNGOSCOPY N/A 10/15/2019   Procedure: DIRECT LARYNGOSCOPY w/BIOPSY;  Surgeon: Izora Gala, MD;  Location: [facility];  Service: ENT;  Laterality: N/A;   GASTROSTOMY N/A 12/04/2019   Procedure: LAPAROSCOPIC G TUBE PLACEMENT;  Surgeon: Ralene Ok, MD;  Location: [facility];  Service: General;  Laterality: N/A;   IR GASTROSTOMY TUBE MOD SED  11/15/2019   IR IMAGING GUIDED PORT INSERTION  11/15/2019   IR [place] GASTRO/COLONIC TUBE PERCUT W/FLUORO  07/20/2020   LEFT HEART CATH AND CORONARY ANGIOGRAPHY N/A 08/13/2020   Procedure: LEFT HEART CATH AND CORONARY ANGIOGRAPHY;  Surgeon: Nelva Bush, MD;  Location: [facility];  Service: Cardiovascular;  Laterality: N/A;   RIGID ESOPHAGOSCOPY N/A 10/15/2019   Procedure: RIGID ESOPHAGOSCOPY;  Surgeon: Izora Gala, MD;  Location: [facility];  Service: ENT;  Laterality: N/A;    Allergies  No Known Allergies  History of Present Illness    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  Cardiology was consulted due to elevated cardiac troponins.  She underwent cardiac catheterization 08/13/2020 which showed mild nonobstructive coronary artery disease with mild plaque in Tina proximal RCA and circumflex.  Tina elevated troponins were felt  to be related to demand ischemia.  Tina echocardiogram showed an LVEF of 60-65% and G1 DD.  She was also noted to have a prolonged QT interval.  Tina potassium was kept above 4 and magnesium above 2.  She was discharged on losartan 25 mg, metoprolol succinate 25 mg, and pantoprazole 40 mg twice daily.  She presents the clinic today for follow-up evaluation states***  *** denies chest pain, shortness of breath, lower extremity edema, fatigue, palpitations, melena, hematuria, hemoptysis, diaphoresis, weakness, presyncope, syncope, orthopnea, and PND.   Home Medications    Prior to Admission medications   Medication Sig Start Date End Date Taking? Authorizing Provider  feeding supplement (ENSURE ENLIVE / ENSURE PLUS) LIQD Take 237 mLs by mouth 3 (three) times daily between meals. 08/14/20   Hosie Poisson, MD  losartan (COZAAR) 25 MG tablet Take 1 tablet (25 mg total) by mouth daily. 08/15/20   Hosie Poisson, MD  metoprolol succinate (TOPROL-XL) 25 MG 24 hr tablet Take 0.5 tablets (12.5 mg total) by mouth daily. 08/15/20   Hosie Poisson, MD  Multiple Vitamin (MULTIVITAMIN WITH MINERALS) TABS tablet Take 1 tablet by mouth daily. 07/24/20 10/22/20  [place] Donnamarie Poag, DO  pantoprazole (PROTONIX) 40 MG tablet Take 40 mg by mouth 2 (two) times daily.    [provider]  polyethylene glycol (MIRALAX / GLYCOLAX) 17 g packet Take 17 g by mouth daily. 08/15/20   Hosie Poisson, MD  senna-docusate (SENOKOT-S) 8.6-50 MG tablet Take 1 tablet by mouth 2 (two) times daily. 07/23/20 10/21/20  [place] Eric J, DO    Family History  Family History  Problem Relation Age of Onset   Hypertension Kennedy    Hypertension Kennedy    She indicated that Tina Kennedy is deceased. She indicated that Tina Kennedy is deceased. She indicated that Tina Kennedy is alive.  Social History    Social History   Socioeconomic History   Marital status: Divorced    Spouse name: Not on file   Number of children: 1   Years of education:  12   Highest education level: Not on file  Occupational History   Not on file  Tobacco Use   Smoking status: Some Days    Packs/day: 0.25    Pack years: 0.00    Types: Cigarettes   Smokeless tobacco: Never   Tobacco comments:    Currently 1-2 cigarettes daily  Vaping Use   Vaping Use: Never used  Substance and Sexual Activity   Alcohol use: Not Currently    Alcohol/week: 1.0 standard drink    Types: 1 Cans of beer per week    Comment: Occasional on weekend   Drug use: Not Currently    Types: Marijuana    Comment: not used in 11/10/2019    Sexual activity: Yes  Other Topics Concern   Not on file  Social History Narrative   Not on file   Social Determinants of Health   Financial Resource Strain: Not on file  Food Insecurity: Not on file  Transportation Needs: Unmet Transportation Needs   Lack of Transportation (Medical): Yes   Lack of Transportation (Non-Medical): Yes  Physical Activity: Not on file  Stress: Not on file  Social Connections: Not on file  Intimate Partner Violence: Not on file     Review of Systems    General:  No chills, fever, night sweats or weight changes.  Cardiovascular:  No chest pain, dyspnea on exertion, edema, orthopnea, palpitations, paroxysmal nocturnal dyspnea. Dermatological: No rash, lesions/masses Respiratory: No cough, dyspnea Urologic: No hematuria, dysuria Abdominal:   No nausea, vomiting, diarrhea, bright red blood per rectum, melena, or hematemesis Neurologic:  No visual changes, wkns, changes in mental status. All other systems reviewed and are otherwise negative except as noted above.  Physical Exam    VS:  There were no vitals taken for this visit. , BMI There is no height or weight on file to calculate BMI. GEN: Well nourished, well developed, in no acute distress. HEENT: normal. Neck: Supple, no JVD, carotid bruits, or masses. Cardiac: RRR, no murmurs, rubs, or gallops. No clubbing, cyanosis, edema.  Radials/DP/PT 2+ and  equal bilaterally.  Respiratory:  Respirations regular and unlabored, clear to auscultation bilaterally. GI: Soft, nontender, nondistended, BS + x 4. MS: no deformity or atrophy. Skin: warm and dry, no rash. Neuro:  Strength and sensation are intact. Psych: Normal affect.  Accessory Clinical Findings    Recent Labs: 11/20/2019: TSH 0.658 08/12/2020: ALT 11 08/14/2020: BUN 6; Creatinine, Ser 1.06; Hemoglobin 14.1; Magnesium 1.6; Platelets 150; Potassium 3.6; Sodium 130   Recent Lipid Panel    Component Value Date/Time   CHOL 187 08/11/2020 2323   TRIG 50 08/11/2020 2323   HDL 113 08/11/2020 2323   CHOLHDL 1.7 08/11/2020 2323   VLDL 10 08/11/2020 2323   LDLCALC 64 08/11/2020 2323    ECG personally reviewed by me today- *** - No acute changes  Echocardiogram 08/11/2020 IMPRESSIONS     1. Left ventricular ejection fraction, by estimation, is 60 to 65%. The  left ventricle has normal function. Left ventricular endocardial border  not optimally defined to evaluate regional wall motion though no wall  motion abnormalities seen in  foreshortened views. Left ventricular diastolic parameters are consistent  with Grade I diastolic dysfunction (impaired relaxation).   2. Right ventricular systolic function is normal. The right ventricular  size is normal.   3. The mitral valve is grossly normal. Trivial mitral valve  regurgitation. No evidence of mitral stenosis.   4. The aortic valve is grossly normal. Aortic valve regurgitation is not  visualized. No aortic stenosis is present.   5. The inferior vena cava is normal in size with greater than 50%  respiratory variability, suggesting right atrial pressure of 3 mmHg.   Comparison(s): No prior Echocardiogram.   Cardiac catheterization 08/13/2020 Conclusions: Mild, non-obstructive coronary artery disease with mild plaquing of the proximal LCx and RCA. Low normal left ventricular systolic function with apical wall motion abnormality  consistent with stress-induced cardiomyopathy. Normal left ventricular filling pressure.   Recommendations: Medical therapy for stress-induced cardiomyopathy.  I will add metoprolol succinate 12.5 mg daily.  Consider adding ACEI/ARB prior to discharge if blood pressure and renal function allow. Primary prevention of coronary artery disease.   Nelva Bush, MD [facility]  Diagnostic Dominance: Right    Intervention   Assessment & Plan   1.  NSTEMI-denies chest pain.  Was noted to have elevated troponins.  Underwent cardiac catheterization 08/13/2020 which showed mild nonobstructive CAD in Tina RCA and circumflex.  Details above. Continue*** Heart healthy low-sodium diet-salty 6 given Increase physical activity as tolerated  Hyperlipidemia-08/11/2020: Cholesterol 187; HDL 113; LDL Cholesterol 64; Triglycerides 50; VLDL 10 Continue*** Heart healthy low-sodium high-fiber diet Increase physical activity as tolerated  Prolonged QT interval-noted to have hypokalemia and hypomagnesemia which were replaced.  Potassium goal greater than 4 and magnesium goal greater than 2. Follows with PCP Repeat BMP, CBC, magnesium  Malnutrition-dysphagia status post PEG placement.  Chronic hyponatremia, also noted to have hypokalemia and hypomagnesemia.  Was admitted with generalized epigastric pain 08/11/2020.  A CT of Tina abdomen pelvis showed large amount of stool identified in the rectal vault.  She was given laxatives and suppository and Tina abdominal pain resolved after having a bowel movement. Continue nutritional supplements, MiraLAX, Protonix, Senokot Follows with PCP Repeat BMP, CBC, magnesium  Disposition: Follow-up with Dr. Margaretann Loveless in 3 months.  Jossie Ng. [place] Thielman NP-C    09/29/2020, 7:10 AM [place] [address] Office [phone] Fax [phone]  Notice: This dictation was prepared with Dragon dictation along with smaller phrase  technology. Any transcriptional errors that result from this process are unintentional and may not be corrected upon review.  I spent***minutes examining this patient, reviewing medications, and using patient centered shared decision making involving Tina cardiac care.  Prior to Tina visit I spent greater than 20 minutes reviewing Tina past medical history,  medications, and prior cardiac tests.

## 2020-10-01 ENCOUNTER — Ambulatory Visit: Payer: Medicare Other | Admitting: General Practice

## 2020-10-07 ENCOUNTER — Inpatient Hospital Stay: Payer: Medicare Other | Attending: Oncology

## 2020-10-07 ENCOUNTER — Inpatient Hospital Stay: Payer: Medicare Other | Admitting: Oncology

## 2020-10-07 ENCOUNTER — Other Ambulatory Visit: Payer: Medicare Other

## 2020-12-01 ENCOUNTER — Emergency Department (HOSPITAL_COMMUNITY): Payer: Medicare Other

## 2020-12-01 ENCOUNTER — Encounter (HOSPITAL_COMMUNITY): Payer: Self-pay

## 2020-12-01 ENCOUNTER — Inpatient Hospital Stay (HOSPITAL_COMMUNITY)
Admission: EM | Admit: 2020-12-01 | Discharge: 2020-12-05 | DRG: 641 | Disposition: A | Discharge status: Hospice | Payer: Medicare Other | Attending: Family Medicine | Admitting: Family Medicine

## 2020-12-01 ENCOUNTER — Other Ambulatory Visit: Payer: Self-pay

## 2020-12-01 DIAGNOSIS — R64 Cachexia: Secondary | ICD-10-CM

## 2020-12-01 DIAGNOSIS — Z79899 Other long term (current) drug therapy: Secondary | ICD-10-CM

## 2020-12-01 DIAGNOSIS — N1831 Chronic kidney disease, stage 3a: Secondary | ICD-10-CM | POA: Diagnosis present

## 2020-12-01 DIAGNOSIS — R531 Weakness: Secondary | ICD-10-CM

## 2020-12-01 DIAGNOSIS — R5383 Other fatigue: Secondary | ICD-10-CM | POA: Diagnosis not present

## 2020-12-01 DIAGNOSIS — E860 Dehydration: Secondary | ICD-10-CM | POA: Diagnosis not present

## 2020-12-01 DIAGNOSIS — N179 Acute kidney failure, unspecified: Secondary | ICD-10-CM | POA: Diagnosis not present

## 2020-12-01 DIAGNOSIS — F17210 Nicotine dependence, cigarettes, uncomplicated: Secondary | ICD-10-CM | POA: Diagnosis present

## 2020-12-01 DIAGNOSIS — R4020 Unspecified coma: Secondary | ICD-10-CM | POA: Diagnosis not present

## 2020-12-01 DIAGNOSIS — Z681 Body mass index (BMI) 19 or less, adult: Secondary | ICD-10-CM | POA: Diagnosis not present

## 2020-12-01 DIAGNOSIS — R1110 Vomiting, unspecified: Secondary | ICD-10-CM | POA: Diagnosis not present

## 2020-12-01 DIAGNOSIS — R0600 Dyspnea, unspecified: Secondary | ICD-10-CM | POA: Diagnosis not present

## 2020-12-01 DIAGNOSIS — Z20822 Contact with and (suspected) exposure to covid-19: Secondary | ICD-10-CM | POA: Diagnosis not present

## 2020-12-01 DIAGNOSIS — Z9221 Personal history of antineoplastic chemotherapy: Secondary | ICD-10-CM

## 2020-12-01 DIAGNOSIS — Z789 Other specified health status: Secondary | ICD-10-CM | POA: Diagnosis not present

## 2020-12-01 DIAGNOSIS — E878 Other disorders of electrolyte and fluid balance, not elsewhere classified: Secondary | ICD-10-CM | POA: Diagnosis not present

## 2020-12-01 DIAGNOSIS — R634 Abnormal weight loss: Secondary | ICD-10-CM

## 2020-12-01 DIAGNOSIS — R296 Repeated falls: Secondary | ICD-10-CM | POA: Diagnosis not present

## 2020-12-01 DIAGNOSIS — Z923 Personal history of irradiation: Secondary | ICD-10-CM

## 2020-12-01 DIAGNOSIS — I700 Atherosclerosis of aorta: Secondary | ICD-10-CM | POA: Diagnosis not present

## 2020-12-01 DIAGNOSIS — R638 Other symptoms and signs concerning food and fluid intake: Secondary | ICD-10-CM | POA: Diagnosis not present

## 2020-12-01 DIAGNOSIS — Z515 Encounter for palliative care: Secondary | ICD-10-CM | POA: Diagnosis not present

## 2020-12-01 DIAGNOSIS — Z8249 Family history of ischemic heart disease and other diseases of the circulatory system: Secondary | ICD-10-CM

## 2020-12-01 DIAGNOSIS — C760 Malignant neoplasm of head, face and neck: Secondary | ICD-10-CM

## 2020-12-01 DIAGNOSIS — F0390 Unspecified dementia without behavioral disturbance: Secondary | ICD-10-CM | POA: Diagnosis present

## 2020-12-01 DIAGNOSIS — C139 Malignant neoplasm of hypopharynx, unspecified: Secondary | ICD-10-CM | POA: Diagnosis present

## 2020-12-01 DIAGNOSIS — E876 Hypokalemia: Secondary | ICD-10-CM | POA: Diagnosis not present

## 2020-12-01 DIAGNOSIS — Z8616 Personal history of COVID-19: Secondary | ICD-10-CM | POA: Diagnosis not present

## 2020-12-01 DIAGNOSIS — K920 Hematemesis: Secondary | ICD-10-CM | POA: Diagnosis not present

## 2020-12-01 DIAGNOSIS — E871 Hypo-osmolality and hyponatremia: Secondary | ICD-10-CM | POA: Diagnosis not present

## 2020-12-01 DIAGNOSIS — Z66 Do not resuscitate: Secondary | ICD-10-CM | POA: Diagnosis not present

## 2020-12-01 DIAGNOSIS — I10 Essential (primary) hypertension: Secondary | ICD-10-CM | POA: Diagnosis not present

## 2020-12-01 DIAGNOSIS — Z743 Need for continuous supervision: Secondary | ICD-10-CM | POA: Diagnosis not present

## 2020-12-01 DIAGNOSIS — M47812 Spondylosis without myelopathy or radiculopathy, cervical region: Secondary | ICD-10-CM | POA: Diagnosis not present

## 2020-12-01 DIAGNOSIS — R32 Unspecified urinary incontinence: Secondary | ICD-10-CM | POA: Diagnosis present

## 2020-12-01 DIAGNOSIS — I2510 Atherosclerotic heart disease of native coronary artery without angina pectoris: Secondary | ICD-10-CM | POA: Diagnosis not present

## 2020-12-01 DIAGNOSIS — R911 Solitary pulmonary nodule: Secondary | ICD-10-CM | POA: Diagnosis not present

## 2020-12-01 DIAGNOSIS — I5181 Takotsubo syndrome: Secondary | ICD-10-CM | POA: Diagnosis not present

## 2020-12-01 DIAGNOSIS — D61818 Other pancytopenia: Secondary | ICD-10-CM | POA: Diagnosis not present

## 2020-12-01 DIAGNOSIS — K219 Gastro-esophageal reflux disease without esophagitis: Secondary | ICD-10-CM | POA: Diagnosis not present

## 2020-12-01 DIAGNOSIS — E43 Unspecified severe protein-calorie malnutrition: Principal | ICD-10-CM | POA: Diagnosis present

## 2020-12-01 DIAGNOSIS — R1113 Vomiting of fecal matter: Secondary | ICD-10-CM | POA: Diagnosis not present

## 2020-12-01 DIAGNOSIS — Z7401 Bed confinement status: Secondary | ICD-10-CM | POA: Diagnosis not present

## 2020-12-01 DIAGNOSIS — E875 Hyperkalemia: Secondary | ICD-10-CM | POA: Diagnosis not present

## 2020-12-01 DIAGNOSIS — R627 Adult failure to thrive: Secondary | ICD-10-CM | POA: Diagnosis present

## 2020-12-01 DIAGNOSIS — R9431 Abnormal electrocardiogram [ECG] [EKG]: Secondary | ICD-10-CM | POA: Diagnosis not present

## 2020-12-01 DIAGNOSIS — I252 Old myocardial infarction: Secondary | ICD-10-CM | POA: Diagnosis not present

## 2020-12-01 DIAGNOSIS — R778 Other specified abnormalities of plasma proteins: Secondary | ICD-10-CM | POA: Diagnosis not present

## 2020-12-01 DIAGNOSIS — E538 Deficiency of other specified B group vitamins: Secondary | ICD-10-CM | POA: Diagnosis present

## 2020-12-01 DIAGNOSIS — Z7189 Other specified counseling: Secondary | ICD-10-CM | POA: Diagnosis not present

## 2020-12-01 DIAGNOSIS — R54 Age-related physical debility: Secondary | ICD-10-CM | POA: Diagnosis not present

## 2020-12-01 LAB — RESP PANEL BY RT-PCR (FLU A&B, COVID) ARPGX2
Influenza A by PCR: NEGATIVE
Influenza B by PCR: NEGATIVE
SARS Coronavirus 2 by RT PCR: NEGATIVE

## 2020-12-01 LAB — CBC WITH DIFFERENTIAL/PLATELET
Abs Immature Granulocytes: 0.03 10*3/uL (ref 0.00–0.07)
Basophils Absolute: 0 10*3/uL (ref 0.0–0.1)
Basophils Relative: 0 %
Eosinophils Absolute: 0 10*3/uL (ref 0.0–0.5)
Eosinophils Relative: 0 %
HCT: 36.5 % (ref 36.0–46.0)
Hemoglobin: 12.3 g/dL (ref 12.0–15.0)
Immature Granulocytes: 1 %
Lymphocytes Relative: 9 %
Lymphs Abs: 0.4 10*3/uL — ABNORMAL LOW (ref 0.7–4.0)
MCH: 31.9 pg (ref 26.0–34.0)
MCHC: 33.7 g/dL (ref 30.0–36.0)
MCV: 94.6 fL (ref 80.0–100.0)
Monocytes Absolute: 0.2 10*3/uL (ref 0.1–1.0)
Monocytes Relative: 5 %
Neutro Abs: 3.4 10*3/uL (ref 1.7–7.7)
Neutrophils Relative %: 85 %
Platelets: 99 10*3/uL — ABNORMAL LOW (ref 150–400)
RBC: 3.86 MIL/uL — ABNORMAL LOW (ref 3.87–5.11)
RDW: 13.4 % (ref 11.5–15.5)
WBC: 4 10*3/uL (ref 4.0–10.5)
nRBC: 1 % — ABNORMAL HIGH (ref 0.0–0.2)

## 2020-12-01 LAB — COMPREHENSIVE METABOLIC PANEL
ALT: 16 U/L (ref 0–44)
AST: 27 U/L (ref 15–41)
Albumin: 3.1 g/dL — ABNORMAL LOW (ref 3.5–5.0)
Alkaline Phosphatase: 80 U/L (ref 38–126)
Anion gap: 18 — ABNORMAL HIGH (ref 5–15)
BUN: 39 mg/dL — ABNORMAL HIGH (ref 8–23)
CO2: 19 mmol/L — ABNORMAL LOW (ref 22–32)
Calcium: 9.2 mg/dL (ref 8.9–10.3)
Chloride: 102 mmol/L (ref 98–111)
Creatinine, Ser: 1.84 mg/dL — ABNORMAL HIGH (ref 0.44–1.00)
GFR, Estimated: 29 mL/min — ABNORMAL LOW (ref >60)
Glucose, Bld: 75 mg/dL (ref 70–99)
Potassium: 4.2 mmol/L (ref 3.5–5.1)
Sodium: 139 mmol/L (ref 135–145)
Total Bilirubin: 1.6 mg/dL — ABNORMAL HIGH (ref 0.3–1.2)
Total Protein: 6.8 g/dL (ref 6.5–8.1)

## 2020-12-01 LAB — TROPONIN I (HIGH SENSITIVITY)
Troponin I (High Sensitivity): 77 ng/L — ABNORMAL HIGH (ref <18)
Troponin I (High Sensitivity): 90 ng/L — ABNORMAL HIGH (ref <18)
Troponin I (High Sensitivity): 80 ng/L — ABNORMAL HIGH (ref <18)
Troponin I (High Sensitivity): 85 ng/L — ABNORMAL HIGH (ref <18)

## 2020-12-01 LAB — MAGNESIUM: Magnesium: 2.2 mg/dL (ref 1.7–2.4)

## 2020-12-01 MED ORDER — SODIUM CHLORIDE 0.9 % IV BOLUS
500.0000 mL | Freq: Once | INTRAVENOUS | Status: AC
  Administered 2020-12-01: 500 mL via INTRAVENOUS

## 2020-12-01 MED ORDER — ENOXAPARIN SODIUM 300 MG/3ML IJ SOLN
15.0000 mg | Freq: Every day | INTRAMUSCULAR | Status: DC
  Administered 2020-12-02: 15 mg via SUBCUTANEOUS
  Filled 2020-12-01 (×3): qty 0.15

## 2020-12-01 MED ORDER — SODIUM CHLORIDE 0.9 % IV SOLN: INTRAVENOUS | Status: DC

## 2020-12-01 NOTE — ED Triage Notes (Signed)
"  She finished up with chemo around April and hasn't been back to the doctor since early May, she says she is eating and drinking, but we can tell she has not because she has lost a lot of weight the past month and we don't think she is walking around, but we have not seen her walk" per daughter Daughter is unsure how she has been getting around and going to the bathroom, etc.

## 2020-12-01 NOTE — ED Provider Notes (Signed)
Palliative care service requesting admission.  And then they can work on appropriate game plan.  Patient inherited by me.  Based on that I will go ahead and start some IV fluids.  Do COVID testing.  Patient did have a significant change in her troponins so those will need to be tracked.  Patient has had neck cancer.  Will probably need reevaluation may be by hematology oncology.  Patient does not have a primary care provider.   Fredia Sorrow, MD 12/01/20 [phone]

## 2020-12-01 NOTE — H&P (Addendum)
[facility] Admission History and Physical Service Pager: [phone]  Patient name: Tina Kennedy Medical record number: [id number] Date of birth: 06-19-1950 Age: 70 y.o. Gender: female  Primary Care Provider: Patient, No Pcp Per (Inactive) Consultants: Palliative Code Status: FULL Preferred Emergency Contact:  Contact Information     Name Relation Home Work Mobile   Altha Sister [phone]  [phone]   Liam Rogers [phone]  [phone]   Annabelle Harman Sister   [phone]        Chief Complaint: Weakness  Assessment and Plan: Tina Kennedy is a 70 y.o. female presenting with onset of weakness onset 3-4 weeks ago. 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 History from patient's sister as pt was confused in the ER. Patient was initially diagnosed with stage 3 SSC of hypopharynx in October 15, 2019 follows Dr. Alen Blew at [facility] on 07/07/20. At that point she was in remission based on PET scan obtained in December 2021. Pt has also not gone to any of her visits with her oncologist since March 2022 which her sister recently found out. Patient has lost a significant amount of weight, 30 to 40 pounds over the last month and is severely cachectic and malnourished (BMI 12, wt 31.8kg). Most likely cause of this is recurrence of her cancer and  metastatic disease. In the ER: Ca 7.5>9.2. CBC unremarkable. Chest x-ray and head and neck CT were unremarkable today.CT head negative. Has been tachycardiac to 107, she remains afebrile but has had episodes of tachypnea up to 31. Palliative team were consulted in the ER and recommended admission for further work up with oncology per family's request. Pt will likely need long term placement/hospice.  -Admit to med-tele, Dr. Gwendlyn Deutscher attending -Vitals per floor protocol -Up with assistance -SLP eval -NPO except sips until SLP eval -Palliative care has been consulted, appreciate rec -Consult heme-onc, appreciate recs  -Consider ENT consult -PT/OT to treat  -CBC, CMP, Mg, Phosp AM  -Lovenox for DVT ppx  Goals of care discussion  poor prognosis Sister, [name], has HCPOA.  Patient is confused, has limited ability, and does not have capacity to participate in goals of care discussion. Sister would like to discuss care and is very involved. Will discuss with other sisters, oncologist and palliative team during the day.  When talking to patient's sister she did express that the patient would want to be DNR. Per palliative's note, pt has a living will, and [name] wants to review this prior to changing to DNR   -Encourage [place] discussion  -Palliative care following, appreciate recs  -Currently full code  Elevated troponins  H/O NSTEMI  Denied chest pain on admission to the ED. Today, troponins were ordered in the ED and were 77>90>80>85. EKG showed NSR with no ST changes.  Trops have been flat, so we will not trend.  We have spoken to cardiology and they said likely that the trops were elevated due to AKI.  She has no symptoms and denies any chest pain at  this time so they do not feel the need to be consulted.  Pt was admitted in May 2022 for NSTEMI with troponin of 1016.  At that time she had an EKG that showed sinus rhythm with no clear ST elevations, though it was treated as NSTEMI. She had an LHC  performed on 08/13/20 that showed mild nonobstructive CAD with mild plaque of the proximal LCx and RCA, low normal left ventricular systolic function with apical wall motion c/w stress-induced cardiomyopathy with normal left ventricular filling pressure.  Echocardiogram on 08/11/2020 showed an ejection fraction of 60 to 65% with grade 1 diastolic dysfunction. On discharge from that admission patient was discharged with metoprolol succinate 12.5 mg daily and Losartan 25 mg daily but has not been taking any medications at this time.  -Continuous cardiac monitoring  -Holding metoprolol and losartan because pt denies taking any medications   AKI Likely prerenal 2/2 dehydration. Creatinine 1.84 which is up from baseline. On 08/13/2020 was 0.91.  GFR is 29 which is down from 57 on 08/14/20.  Patient has not been urinating well which is likely secondary to dehydration and poor p.o. intake.  However we will check to see if she is retaining urine with a bladder scan. -Monitor with repeat BMP -Avoid nephrotoxic agents  -S/p NS 532m bolus, can give another bolus as needed -Cont with maintenance IVF -Bladder scan -UA pending   H/o Dysphagia with PEG placement/Severe malnutrition Patient was originally on a feeding supplement, Ensure.  She no longer has a PEG tube (it has fallen out twice) and is not using a feeding supplement.  She has lost a 30-40lb weight over the last month.  Per her sister she has not been eating or drinking over the last several weeks and is concerned about her choking/aspirating. Electrolytes wnl.  -Nutrition consult -Monitor BMP -Mag and phosphate ordered -SLP evaluation -NPO with sips until SLP evaluation -PT evaluate and treat  H/o mallory weiss tear vs gastritis  In April 2022, he was admitted for coffee-ground emesis that was likely secondary to gastritis versus Mallory-Weiss tear.  She was treated with supportive care and observation.  At that time they did not do an EGD.  And she  was sent home with Protonix 40 mg p.o. twice a day for 2 months followed by 40 mg once daily.  Patient's hemoglobin was stable today at 12.3.  Patient has no active signs of bleeding at this time.  -Monitor hemoglobin  GERD Chronic history of GERD, patient is supposed to be taking pantoprazole 40 mg 2 times a day.  -Not taking anything, NPO with sips-can restart pantoprazole as able/needed   FEN/GI: NPO with sips  Prophylaxis: Lovenox   Disposition: med-tele  History of Present Illness:  Meleena Delores WJacalyn Soulsbyis a 70y.o. female presenting with weakness. 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   History per sister: Pt was brought to the ER because she was dehydrated and weak with poor po intake for 3 weeks to 1 month. She had a hospital admission for NSTEMI May 2020 to and since then she has had reduced PO intake. She has lost a lot of weight, 30-40lb in 1 month. Pt was last her normal self in June of this year. She was asking for food but then would not eat  any food. Sister tried to give her protein shakes and green smoothies, but the pt didn't drink them. The patient originally did not want to go to the hospital.  Sister also reports increased sleeping in August 2022. Pt has had many falls recently and has had gradual decline in mobility. Has had some rectal bleeding related to hemorrhoids. In March 2022, the oncologist said she was clear from cancer. However, she has missed appointments with her oncologist since April 2022. She gets confused with a lot of things and very muddled. She is very tired, and she is asking for people who have passed away. She has no oxygen requirement at home. She sometimes has urinary incontinence, and has  not been urinating much, only once today.   The pt lives alone, but her sister lives near.  Review Of Systems: Per HPI with the following additions:   Review of Systems  Constitutional:  Positive for activity change, appetite change and unexpected weight change. Negative for chills and fever.  HENT:  Negative for congestion, rhinorrhea and sore throat.   Respiratory:  Negative for cough, shortness of breath and wheezing.   Gastrointestinal:  Positive for blood in stool. Negative for abdominal pain, diarrhea, nausea and vomiting.  Genitourinary:  Positive for difficulty urinating. Negative for dysuria and frequency.  Neurological:  Positive for weakness.  Psychiatric/Behavioral:  Positive for confusion.     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[place]) 11/16/2019   Goals of care, counseling/discussion 10/31/2019   Hypopharyngeal cancer ([place]) 10/28/2019    Past Medical History: Past Medical History:  Diagnosis Date   Hypopharyngeal cancer ([place])    Prolonged QT interval     Past Surgical History: Past Surgical History:  Procedure Laterality Date   DIRECT LARYNGOSCOPY N/A 10/15/2019   Procedure: DIRECT LARYNGOSCOPY w/BIOPSY;  Surgeon: Izora Gala, MD;  Location: [place];  Service: ENT;  Laterality: N/A;   GASTROSTOMY N/A 12/04/2019   Procedure: LAPAROSCOPIC G TUBE PLACEMENT;  Surgeon: Ralene Ok, MD;  Location: [facility];  Service: General;  Laterality: N/A;   IR GASTROSTOMY TUBE MOD SED  11/15/2019   IR IMAGING  GUIDED PORT INSERTION  11/15/2019   IR [place] GASTRO/COLONIC TUBE PERCUT W/FLUORO  07/20/2020   LEFT HEART CATH AND CORONARY ANGIOGRAPHY N/A 08/13/2020   Procedure: LEFT HEART CATH AND CORONARY ANGIOGRAPHY;  Surgeon: Nelva Bush, MD;  Location: [facility];  Service: Cardiovascular;  Laterality: N/A;   RIGID ESOPHAGOSCOPY N/A 10/15/2019   Procedure: RIGID ESOPHAGOSCOPY;  Surgeon: Izora Gala, MD;  Location: [place];  Service: ENT;  Laterality: N/A;    Social History: Social History   Tobacco Use   Smoking status: Some Days    Packs/day: 0.25    Types: Cigarettes   Smokeless tobacco: Never   Tobacco comments:    Currently 1-2 cigarettes daily  Vaping Use   Vaping Use: Never used  Substance Use Topics   Alcohol use: Not Currently    Alcohol/week: 1.0 standard drink    Types: 1 Cans of beer per week    Comment: Occasional  on weekend   Drug use: Not Currently    Types: Marijuana    Comment: not used in 11/10/2019    Additional social history: See HPI  Please also refer to relevant sections of EMR.  Family History: Family History  Problem Relation Age of Onset   Hypertension Mother    Hypertension Sister      Allergies and Medications: No Known Allergies No current facility-administered medications on file prior to encounter.   Current Outpatient Medications on File Prior to Encounter  Medication Sig Dispense Refill   feeding supplement (ENSURE ENLIVE / ENSURE PLUS) LIQD Take 237 mLs by mouth 3 (three) times daily between meals. (Patient not taking: Reported on 12/01/2020) 237 mL 12   losartan (COZAAR) 25 MG tablet Take 1 tablet (25 mg total) by mouth daily. (Patient not taking: Reported on 12/01/2020) 30 tablet 1   metoprolol succinate (TOPROL-XL) 25 MG 24 hr tablet Take 0.5 tablets (12.5 mg total) by mouth daily. (Patient not taking: Reported on 12/01/2020) 30 tablet 1   polyethylene glycol (MIRALAX / GLYCOLAX) 17 g packet Take 17 g by mouth daily. (Patient not taking:  Reported on 12/01/2020) 14 each 0    Objective: BP 137/89   Pulse 76   Temp 97.8 F (36.6 C)   Resp 20   Ht '5\' 4"'$  (1.626 m)   Wt 31.8 kg   SpO2 100%   BMI 12.02 kg/m  Physical Exam Vitals reviewed.  Constitutional:      Appearance: She is ill-appearing.     Comments: Severely malnourished  HENT:     Head: Normocephalic and atraumatic.     Mouth/Throat:     Mouth: Mucous membranes are dry.  Cardiovascular:     Rate and Rhythm: Normal rate and regular rhythm.     Pulses: Normal pulses.     Heart sounds: No murmur heard. Pulmonary:     Effort: Pulmonary effort is normal. No respiratory distress.     Breath sounds: Normal breath sounds.  Abdominal:     General: Bowel sounds are normal.     Palpations: Abdomen is soft.     Tenderness: There is no abdominal tenderness.  Neurological:     Mental Status: She is alert and oriented to person, place, and time.     Motor: Weakness present.  Psychiatric:     Comments: Confused      Labs and Imaging: CBC BMET  Recent Labs  Lab 12/01/20 1439  WBC 4.0  HGB 12.3  HCT 36.5  PLT 99*   Recent Labs  Lab 12/01/20 1439  NA 139  K 4.2  CL 102  CO2 19*  BUN 39*  CREATININE 1.84*  GLUCOSE 75  CALCIUM 9.2     EKG: Sinus rhythm with no ST changes   C-xray: No active disease  CT of the head without contrast: No intracranial pathology  Tina Emery, MD 12/02/2020, 1:22 AM PGY-1, [place] Intern pager: [phone], text pages welcome  FPTS Upper-Level Resident Addendum   I have independently interviewed and examined the patient. I have discussed the above with the original author and agree with their documentation. My edits for correction/addition/clarification are in black. Please see also any attending notes.   Lattie Haw MD PGY-3, [facility] 12/02/2020 2:38 AM  FPTS Service pager: [phone] (text pages welcome through [facility])

## 2020-12-01 NOTE — Care Management (Addendum)
ED RN Care Manager reviewed  chart  noting that patient was receiving chemo up until May 2022, and has not been back since then. Patient presented today to  the ED and appearing  quite cachectic.According to previous ED CM note patient lives alone at home.  PT was also consulted to evaluate patient in the ED and noted that patient was  a 2 person assist  sh unable to stand, and upon attempting she  became orthostatic. This ED RN Care Manager met with patient and sister at bedside  on [facility].  Discussed goals of care.Patient reports that she eats despite a significant amount weight loss.,  " I want to do something and feel better" Discussed Palliative services with patient and sister, who seem to be agreeable with the idea. Discussed with EDP patient would benefit from Palliative consult, this patient has had  such significant weight loss and unable to care for self and she lives alone She will not be a candidate for [facility] services due to level of care needed. . Palliative consult pending.  Updated Nsg Staff  TOC will continue to follow for transitional care planning.

## 2020-12-01 NOTE — ED Provider Notes (Signed)
[facility] EMERGENCY DEPARTMENT Provider Note   CSN: [id number] Arrival date & time: 12/01/20  1308     History Chief Complaint  Patient presents with   Weakness    Tina Kennedy is a 70 y.o. female.  Presented to ER with concern for generalized weakness.  Patient states she is been dealing with some generalized weakness for the past few months.  Her sister at bedside states that she is concerned that she has not been eating and drinking very well.  Has lost a large amount of weight over the last few months.  She lives alone, has some family support from her siblings.  1 son who is not involved in caregiving.  Denies chest pain or difficulty in breathing at present.  No recent fevers.  Patient has history of hypopharyngeal cancer.  Not currently on any treatment.  She believes she is in remission at present.  HPI     Past Medical History:  Diagnosis Date   Hypopharyngeal cancer ([place])    Prolonged QT interval     Patient Active Problem List   Diagnosis Date Noted   Prolonged Q-T interval on ECG 08/14/2020   AKI (acute kidney injury) ([place]) 08/12/2020   Hyponatremia 08/12/2020   Hypokalemia 08/12/2020   Abdominal pain 08/12/2020   Protein-calorie malnutrition, severe 08/12/2020   NSTEMI (non-ST elevated myocardial infarction) ([place]) 08/11/2020   Hyperbilirubinemia 07/21/2020   Erythrocytosis 07/21/2020   COVID-19 virus infection 07/21/2020   Port-A-Cath in place 12/26/2019   Carcinoma of posterior hypopharyngeal wall ([place]) 11/16/2019   Goals of care, counseling/discussion 10/31/2019   Hypopharyngeal cancer ([place]) 10/28/2019    Past Surgical History:  Procedure Laterality Date   DIRECT LARYNGOSCOPY N/A 10/15/2019   Procedure: DIRECT LARYNGOSCOPY w/BIOPSY;  Surgeon: Izora Gala, MD;  Location: [place];  Service: ENT;  Laterality: N/A;   GASTROSTOMY N/A 12/04/2019   Procedure: LAPAROSCOPIC G TUBE PLACEMENT;  Surgeon: Ralene Ok, MD;   [facility];  Service: General;  Laterality: N/A;   IR GASTROSTOMY TUBE MOD SED  11/15/2019   IR IMAGING GUIDED PORT INSERTION  11/15/2019   IR [place] GASTRO/COLONIC TUBE PERCUT W/FLUORO  07/20/2020   LEFT HEART CATH AND CORONARY ANGIOGRAPHY N/A 08/13/2020   Procedure: LEFT HEART CATH AND CORONARY ANGIOGRAPHY;  Surgeon: Nelva Bush, MD;  Location: [place] [facility];  Service: Cardiovascular;  Laterality: N/A;   RIGID ESOPHAGOSCOPY N/A 10/15/2019   Procedure: RIGID ESOPHAGOSCOPY;  Surgeon: Izora Gala, MD;  Location: [place];  Service: ENT;  Laterality: N/A;     OB History   No obstetric history on file.     Family History  Problem Relation Age of Onset   Hypertension Mother    Hypertension Sister     Social History   Tobacco Use   Smoking status: Some Days    Packs/day: 0.25    Types: Cigarettes   Smokeless tobacco: Never   Tobacco comments:    Currently 1-2 cigarettes daily  Vaping Use   Vaping Use: Never used  Substance Use Topics   Alcohol use: Not Currently    Alcohol/week: 1.0 standard drink    Types: 1 Cans of beer per week    Comment: Occasional on weekend   Drug use: Not Currently    Types: Marijuana    Comment: not used in 11/10/2019     Home Medications Prior to Admission medications   Medication Sig Start Date End Date Taking? Authorizing Provider  feeding supplement (ENSURE ENLIVE /  ENSURE PLUS) LIQD Take 237 mLs by mouth 3 (three) times daily between meals. 08/14/20   Hosie Poisson, MD  losartan (COZAAR) 25 MG tablet Take 1 tablet (25 mg total) by mouth daily. 08/15/20   Hosie Poisson, MD  metoprolol succinate (TOPROL-XL) 25 MG 24 hr tablet Take 0.5 tablets (12.5 mg total) by mouth daily. 08/15/20   Hosie Poisson, MD  pantoprazole (PROTONIX) 40 MG tablet Take 40 mg by mouth 2 (two) times daily.    [provider]  polyethylene glycol (MIRALAX / GLYCOLAX) 17 g packet Take 17 g by mouth daily. 08/15/20   Hosie Poisson, MD    Allergies    Patient has no  known allergies.  Review of Systems   Review of Systems  Constitutional:  Positive for activity change, appetite change and fatigue. Negative for chills and fever.  HENT:  Negative for ear pain and sore throat.   Eyes:  Negative for pain and visual disturbance.  Respiratory:  Negative for cough and shortness of breath.   Cardiovascular:  Negative for chest pain and palpitations.  Gastrointestinal:  Negative for abdominal pain and vomiting.  Genitourinary:  Negative for dysuria and hematuria.  Musculoskeletal:  Negative for arthralgias and back pain.  Skin:  Negative for color change and rash.  Neurological:  Negative for seizures and syncope.  All other systems reviewed and are negative.  Physical Exam Updated Vital Signs BP (!) 124/93 (BP Location: Right Arm)   Pulse 98   Temp 97.8 F (36.6 C) (Oral)   Resp (!) 21   Ht '5\' 4"'$  (1.626 m)   Wt 31.8 kg   SpO2 98%   BMI 12.02 kg/m   Physical Exam Vitals and nursing note reviewed.  Constitutional:      General: She is not in acute distress.    Appearance: She is well-developed.     Comments: Cachectic, frail  HENT:     Head: Normocephalic and atraumatic.  Eyes:     Conjunctiva/sclera: Conjunctivae normal.  Cardiovascular:     Rate and Rhythm: Normal rate and regular rhythm.     Heart sounds: No murmur heard. Pulmonary:     Effort: Pulmonary effort is normal. No respiratory distress.     Breath sounds: Normal breath sounds.  Abdominal:     Palpations: Abdomen is soft.     Tenderness: There is no abdominal tenderness.  Musculoskeletal:        General: No deformity or signs of injury.     Cervical back: Neck supple.  Skin:    General: Skin is warm and dry.  Neurological:     General: No focal deficit present.     Mental Status: She is alert.     Comments: Normal strength and sensation in upper and lower extremities  Psychiatric:        Mood and Affect: Mood normal.    ED Results / Procedures / Treatments    Labs (all labs ordered are listed, but only abnormal results are displayed) Labs Reviewed  CBC WITH DIFFERENTIAL/PLATELET - Abnormal; Notable for the following components:      Result Value   RBC 3.86 (*)    nRBC 1.0 (*)    All other components within normal limits  COMPREHENSIVE METABOLIC PANEL  URINALYSIS, ROUTINE W REFLEX MICROSCOPIC  TROPONIN I (HIGH SENSITIVITY)  TROPONIN I (HIGH SENSITIVITY)    EKG None  Radiology DG Chest Portable 1 View  Result Date: 12/01/2020 CLINICAL DATA:  Dyspnea and weakness, hypopharynx squamous cell  carcinoma EXAM: PORTABLE CHEST 1 VIEW COMPARISON:  08/11/2020 chest radiograph. FINDINGS: Right internal jugular Port-A-Cath terminates in the lower third of the SVC. Stable cardiomediastinal silhouette with normal heart size. No pneumothorax. No pleural effusion. Lungs appear clear, with no acute consolidative airspace disease and no pulmonary edema. IMPRESSION: No active disease. Electronically Signed   By: Ilona Sorrel M.D.   On: 12/01/2020 14:34    Procedures Procedures   Medications Ordered in ED Medications - No data to display  ED Course  I have reviewed the triage vital signs and the nursing notes.  Pertinent labs & imaging results that were available during my care of the patient were reviewed by me and considered in my medical decision making (see chart for details).    MDM Rules/Calculators/A&P                           70 year old lady with past history of hypopharyngeal cancer presenting to ER with concern for generalized weakness.  On exam patient is frail, cachectic, chronically ill-appearing but not in any acute distress.  No focal weakness.  Her vital signs are stable.  Consulted TOC.  Check basic labs, UA, CXR.  While awaiting work-up and reassessment, signed out to Dr. Rogene [place].  Disposition pending.  Final Clinical Impression(s) / ED Diagnoses Final diagnoses:  Generalized weakness  Weight loss    Rx / DC Orders ED  Discharge Orders     None        Lucrezia Starch, MD 12/01/20 [phone]

## 2020-12-01 NOTE — Progress Notes (Signed)
Physical Therapy Evaluation Patient Details Name: Tina Kennedy MRN: [id number] DOB: [birth year] Today's Date: 12/01/2020   History of Present Illness  Pt is a 70yo female presenting to [facility] ED on 8/23 with reports of generalized weakness. Imaging negative for acute findings. Pt reports significant weight loss. 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   Clinical Impression  Pt presents with the impairments above and problems listed below. Pt required mod assist +2 during bed mobility and transfers. Pt demonstrated positive orthostatics upon standing so further mobility deferred (see vitals flowsheet). Pt reporting conflicting information throughout. Pt lives alone and does not have 24/7 assistance. Recommending SNF-level therapies upon discharge to address current deficits. We will continue to follow her acutely to promote independence with functional mobility.     Follow Up Recommendations SNF;Supervision/Assistance - 24 hour    Equipment Recommendations  Rolling walker with 5" wheels;3in1 (PT);Wheelchair (measurements PT);Wheelchair cushion (measurements PT)    Recommendations for Other Services       Precautions / Restrictions Precautions Precautions: Fall Restrictions Weight Bearing Restrictions: No      Mobility  Bed Mobility Overal bed mobility: Needs Assistance Bed Mobility: Supine to Sit;Sit to Supine     Supine to sit: Mod assist Sit to supine: Mod assist   General bed mobility comments: Pt able to advance LE off bed; required mod assist for trunk control to acheive sitting EOB. Pt reported mild dizziness with sitting upright, BP recorded 94/76, HR 107. With prolonged sitting, BP elevated to 102/mid 70s.    Transfers Overall transfer level: Needs assistance Equipment used: None;Rolling walker (2 wheeled) Transfers: Sit to/from Stand Sit to Stand: Mod assist;+2  physical assistance         General transfer comment: Pt attempted sit to stand transfer with RW and mod assist +2 for physical assist; unable to acheive full upright standing with multimodal cuing. Upon second attempt, no AD utilized, mod assist +2 for physical assist with tactile cuing for anterior translation of hips to acheive upright standing. Pt required blocking of BLE.  Pt reported increased symptoms of dizziness and weakness and was returned to supine position. BP in supine recorded at 99/81, HR of 110. RN notified  Ambulation/Gait                Stairs            Wheelchair Mobility    Modified Rankin (Stroke Patients Only)       Balance Overall balance assessment: Needs assistance Sitting-balance support: Single extremity supported;Feet unsupported Sitting balance-Leahy Scale: Poor Sitting balance - Comments: Pt utilizes single UE support during static sitting   Standing balance support: Bilateral upper extremity supported;During functional activity Standing balance-Leahy Scale: Poor Standing balance comment: Pt reliant on BUE support of PT +2 during static stance.                             Pertinent Vitals/Pain Pain Assessment: 0-10 Pain Score: 8  Pain Location: pt has hemorrhoids, reports three week duration. Pain Descriptors / Indicators: Burning;Discomfort Pain Intervention(s): Monitored during session;Limited activity within patient's tolerance    Home Living Family/patient expects to be discharged to:: Private residence Living Arrangements: Alone Available Help at Discharge: Family;Available PRN/intermittently Type of Home: House Home Access: Stairs to enter Entrance Stairs-Rails: None Entrance Stairs-Number of Steps: 4 Home Layout: One level Home Equipment: Toilet riser Additional Comments: Pt's sister Tina Kennedy  provided most of home environment information.    Prior Function Level of Independence: Needs assistance   Gait /  Transfers Assistance Needed: Pt reports she is able to walk; Pt sister reports "last time I saw her walk was May 2022"  ADL's / Homemaking Assistance Needed: Pt reports she accomplishes ADLs independently; sister unable to verify. Pt reports that she eats regularly, sister unable to confirm.  Comments: Pt's cognitive status limits accuracy of prior level of function information.     Hand Dominance        Extremity/Trunk Assessment   Upper Extremity Assessment Upper Extremity Assessment: Generalized weakness;RUE deficits/detail;LUE deficits/detail RUE Deficits / Details: strength 3/5 grossly LUE Deficits / Details: strength 3/5 grossly    Lower Extremity Assessment Lower Extremity Assessment: Generalized weakness    Cervical / Trunk Assessment Cervical / Trunk Assessment: Kyphotic  Communication   Communication: No difficulties  Cognition Arousal/Alertness: Awake/alert Behavior During Therapy: Flat affect Overall Cognitive Status: Impaired/Different from baseline Area of Impairment: Orientation;Memory;Awareness;Problem solving                 Orientation Level: Disoriented to;Time;Situation   Memory: Decreased short-term memory     Awareness: Emergent Problem Solving: Slow processing General Comments: Reports it was July of 1923. Pt reporting conflicting information throughout.      General Comments General comments (skin integrity, edema, etc.): Pt appearing very malnourished.    Exercises     Assessment/Plan    PT Assessment Patient needs continued PT services  PT Problem List Decreased strength;Decreased range of motion;Decreased activity tolerance;Decreased mobility;Decreased balance;Decreased coordination;Decreased cognition;Decreased knowledge of use of DME;Decreased safety awareness;Cardiopulmonary status limiting activity       PT Treatment Interventions DME instruction;Gait training;Stair training;Functional mobility training;Therapeutic  activities;Therapeutic exercise;Balance training;Cognitive remediation;Patient/family education;Wheelchair mobility training    PT Goals (Current goals can be found in the Care Plan section)  Acute Rehab PT Goals Patient Stated Goal: to get stronger PT Goal Formulation: With patient/family Time For Goal Achievement: 12/15/20 Potential to Achieve Goals: Fair    Frequency Min 2X/week   Barriers to discharge Decreased caregiver support;Inaccessible home environment      Co-evaluation               AM-PAC PT "6 Clicks" Mobility  Outcome Measure Help needed turning from your back to your side while in a flat bed without using bedrails?: A Lot Help needed moving from lying on your back to sitting on the side of a flat bed without using bedrails?: Total Help needed moving to and from a bed to a chair (including a wheelchair)?: Total Help needed standing up from a chair using your arms (e.g., wheelchair or bedside chair)?: Total Help needed to walk in hospital room?: Total Help needed climbing 3-5 steps with a railing? : Total 6 Click Score: 7    End of Session Equipment Utilized During Treatment: Gait belt Activity Tolerance: Patient limited by fatigue;Treatment limited secondary to medical complications (Comment) (dizziness) Patient left: in bed;with call bell/phone within reach;with family/visitor present (on stretcher in ED; sister Tina Kennedy) Nurse Communication: Mobility status;Other (comment) (Pt's BP) PT Visit Diagnosis: Muscle weakness (generalized) (M62.81);Adult, failure to thrive (R62.7);Difficulty in walking, not elsewhere classified (R26.2);Unsteadiness on feet (R26.81)    Time: [id number] PT Time Calculation (min) (ACUTE ONLY): 29 min   Charges:   PT Evaluation $PT Eval Moderate Complexity: 1 Mod PT Treatments $Therapeutic Activity: 8-22 mins        Dawayne Cirri, SPT Dawayne Cirri 12/01/2020, 5:01 PM

## 2020-12-01 NOTE — Discharge Planning (Signed)
RNCM met with pt and sister at bedside regarding disposition planning.  Sister is concerned about pt returning home alone in this deconditioned state.  RNCM discussed home health options with pt and sister.  Both are willing to explore this option should pt be deemed safe to return home alone.  Plan for ED staff to access gait and consult PT for evaluation if needed.  RNCM will refer pt for home health services.

## 2020-12-01 NOTE — Consult Note (Signed)
Consultation Note Date: 12/01/2020   Patient Name: Tina Kennedy  DOB: 04/24/1950  MRN: [id number]  Age / Sex: 70 y.o., female  PCP: Patient, No Pcp Per (Inactive) Referring Physician: Fredia Sorrow, MD  Reason for Consultation: Establishing goals of care "end stage head neck cancer"  HPI/Patient Profile: 70 y.o. 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  She was last seen by oncology in March 2021 with plan for observation and surveillance. She was recommended to follow-up in May, but did not show up for this appointment.  In the ED, labs significant for creatinine of 1.84 and troponin of 77. CT head negative for acute findings but does show mild global parenchymal volume loss and chronic white matter microangiopathy. Chest x-ray negative for acute findings.     Clinical Assessment and Goals of Care: I have reviewed medical records including EPIC notes, labs and imaging, and met at bedside with patient and her sister Tina Kennedy to discuss diagnosis, prognosis, GOC, EOL wishes, disposition, and options.  Tina Kennedy shares that she is the patient's HCPOA. Patient is currently confused and has limited ability to participate in [place] discussion.  I introduced Palliative Medicine as specialized medical care for people living with serious illness. It focuses on providing relief from the symptoms and stress of a serious illness.   We discussed a brief life review of the patient. She is originally from [place]. She is divorced. She has 1 son Tina Kennedy, who is currently in prison. She is retired, but worked at [employer] for many years. Patient has 5 sisters and 2 brothers, most of whom live in [place].   We  discussed her current illness and what it means in the larger context of her ongoing co-morbidities. We reviewed her oncology history. Patient states when she last saw oncology in March 2022, she was told "they got it all". Natural disease trajectory of advanced cancer was discussed.  As far as functional and nutritional status, there has been a dramatic decline over the past few months. Patient is currently unable to ambulate. Patient states she is eating and drinking, however has evidence of severe malnutrition. I let Tina Kennedy know that patient's current weight is 70 lbs. I expressed concern to Tina Kennedy that this degree of malnutrition and weight loss is consistent with advanced cancer.   We did discuss code status. Encouraged patient and sister to consider DNR/DNI status understanding evidenced based poor outcomes in similar hospitalized patients, as the cause of the arrest is likely associated with chronic/terminal disease rather than a reversible acute cardio-pulmonary event. Tina Kennedy seems to indicate that she would agree that DNR is appropriate. However, she states the patient has a living will and wants to review this with me prior to changing code status.   At this time Tina Kennedy wishes to initiate supportive interventions - IV fluid, lab work, monitoring, etc. She also wishes  for oncology to see patient while in the hospital.   Questions and concerns were addressed.  Tina Kennedy was provided with my contact card and was encouraged to call with questions or concerns.    Primary decision maker: Tina Kennedy (sister and HCPOA)    SUMMARY OF RECOMMENDATIONS   Full code full scope with ongoing discussion Sister Tina Kennedy is patient's HCPOA and will bring a copy of this document Tina Kennedy seems to indicate that DNR status is appropriate but wants to review patient' living will prior to changing code status Recommend oncology consult PMT will continue to follow   Code Status/Advance Care Planning: Full  code  Palliative Prophylaxis:  Oral Care and Turn Reposition  Additional Recommendations (Limitations, Scope, Preferences): Full Scope Treatment  Psycho-social/Spiritual:  Created space and opportunity for family to express thoughts and feelings regarding patient's current medical situation.  Emotional support provided   Prognosis:  poor  Discharge Planning: To Be Determined      Primary Diagnoses: Present on Admission: **None**   I have reviewed the medical record, interviewed the patient and family, and examined the patient. The following aspects are pertinent.  Past Medical History:  Diagnosis Date   Hypopharyngeal cancer ([place])    Prolonged QT interval      Family History  Problem Relation Age of Onset   Hypertension Mother    Hypertension Sister    Scheduled Meds: Continuous Infusions:  sodium chloride     sodium chloride     PRN Meds:. Medications Prior to Admission:  Prior to Admission medications   Medication Sig Start Date End Date Taking? Authorizing Provider  feeding supplement (ENSURE ENLIVE / ENSURE PLUS) LIQD Take 237 mLs by mouth 3 (three) times daily between meals. 08/14/20   Hosie Poisson, MD  losartan (COZAAR) 25 MG tablet Take 1 tablet (25 mg total) by mouth daily. 08/15/20   Hosie Poisson, MD  metoprolol succinate (TOPROL-XL) 25 MG 24 hr tablet Take 0.5 tablets (12.5 mg total) by mouth daily. 08/15/20   Hosie Poisson, MD  pantoprazole (PROTONIX) 40 MG tablet Take 40 mg by mouth 2 (two) times daily.    [provider]  polyethylene glycol (MIRALAX / GLYCOLAX) 17 g packet Take 17 g by mouth daily. 08/15/20   Hosie Poisson, MD   No Known Allergies Review of Systems  Unable to perform ROS: Mental status change   Physical Exam Vitals reviewed.  Constitutional:      General: She is not in acute distress.    Appearance: She is cachectic. She is ill-appearing.  Pulmonary:     Effort: Pulmonary effort is normal.  Neurological:     Mental  Status: She is alert. She is confused.     Motor: Weakness present.    Vital Signs: BP 109/89   Pulse 92   Temp 97.8 F (36.6 C)   Resp 18   Ht 5' 4" (1.626 m)   Wt 31.8 kg   SpO2 99%   BMI 12.02 kg/m  Pain Scale: 0-10   Pain Score: 0-No pain   SpO2: SpO2: 99 % O2 Device:SpO2: 99 % O2 Flow Rate: .   IO: Intake/output summary: No intake or output data in the 24 hours ending 12/01/20 1923  LBM:   Baseline Weight: Weight: 31.8 kg Most recent weight: Weight: 31.8 kg      Palliative Assessment/Data: PPS 20-30%     Time In: 1830 Time Out: 1940 Time Total: 70 minutes Greater than 50%  of this time was spent  counseling and coordinating care related to the above assessment and plan.  Signed by: Lavena Bullion, NP   Please contact Palliative Medicine Team phone at [phone] for questions and concerns.  For individual provider: See Shea Evans

## 2020-12-01 NOTE — Progress Notes (Addendum)
Family Medicine Teaching Service Daily Progress Note Intern Pager: [phone]  Patient name: Tina Kennedy Medical record number: [id number] Date of birth: 1950-11-13 Age: 70 y.o. Gender: female  Primary Care Provider: Patient, No Pcp Per (Inactive) Consultants: Cardiology, heme-onc, palliative Code Status: DNR  Pt Overview and Major Events to Date:  8/23: admitted  Assessment and Plan: Tina Kennedy is a 70 y/o F presenting with weakness over last 3 to 4 weeks and significant weight loss between 30 to 40 pounds in the last month. 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  Generalized weakness  AMS Patient was initially diagnosed 10/15/2019 and is seen by Dr. Alen Blew at [facility] on 07/07/2020.  At that point was in remission based on PET scan obtained in 03/2020.  Chest x-ray and head CT were unremarkable at presentation to ED. CT chest and soft tissue neck unremarkable therefore no current suspicion of recurrence of cancer.  She presented with generalized weakness likely 2/2 stage III SCC of the hypopharynx s/p direct laryngoscopy, cisplatin chemotherapy x6 cycles with radiation.  Patient does not appear to interpret chronology well or know the date despite knowing where she is.  Infectious, bleeding, trauma etiology ruled out with imaging and labs.  Patient is not hypoglycemic.  TSH B12 normal.  Folate low at 4.4.  At this time, I am suspicious of dementia as cause of patient's deteriorating state that is contributing to malnutrition.  Patient states she does not usually feel hungry.  She lives alone and does not ambulate well.  I am concerned that she lacks capacity to care for self and is inappropriately  neglecting nutritional needs. -Perform MoCA to assess cognitive status -MR brain with/without contrast, follow-up with neuro consult -Await SLP evaluation for swallowing  History of dysphagia with PEG placement/severe malnutrition Patient was originally on Ensure feeding supplement.  Electrolytes WNL on presentation.  Magnesium and phosphate normal.  -Await SLP evaluation for swallowing  Elevated troponins  history of NSTEMI History of NSTEMI May 2022 with troponin 1016.  EKG at that time showed sinus rhythm with no clear ST elevations.  Patient had left heart cath performed which showed mild nonobstructive CAD with mild plaque of the proximal LCx and RCA, low/normal LV systolic function with apical wall motion consistent with stress-induced cardiomyopathy with normal LV filling pressure.  Echocardiogram on 08/11/2020 showed EF 60-65% with G1DD.  Patient was discharged at that time with metoprolol succinate 12.5 mg daily and losartan 25 mg daily but was not actively taking and therefore not restarted.  Troponins no longer being trended.  Goals of care discussion Sister has HC POA.  Will discuss with other sisters, oncologist and palliative team.  DNR at this time. Received page from nurse was informed that patient will be DNR, per sister. -Change status to DNR  AKI Likely prerenal consistent with malnutrition.  CMP on admission showed electrolytes WNL.  Creatinine 1.84 from baseline 0.91.  Repeat BMP showed creatinine 1.66.  Patient is receiving NS at 100 mL/h.  We will change fluids to provide glucose as patient is n.p.o. at this time. -Change to D5 1/2NS at 171m/hr  Chronic hyponatremia Sodium on admission 139.  History of Mallory-Weiss tear versus gastritis In April 2022 admitted for coffee-ground emesis likely secondary to gastritis versus Mallory-Weiss tear.  Treated with supportive care and observation.  Did not receive EGD and sent home with Protonix 40 mg twice daily x2 months 40 mg  daily hemoglobin during admission 12.3 without active signs of bleeding.  Hemoglobin today 11.2.  FEN/GI: Clear liquid diet PPx: Lovenox Dispo: pending clinical improvement . Barriers include clinical diagnosis.   Subjective:  Patient does not list any concerns and states she is not in pain anywhere or having any difficulty breathing.  She states she does not generally have feelings of hunger.  When asked which month it is patient continues to state all of the months of the year.  Objective: Temp:  [97.4 F (36.3 C)-98.2 F (36.8 C)] 97.4 F (36.3 C) (08/24 1647) Pulse Rate:  [65-104] 72 (08/24 1647) Resp:  [13-31] 18 (08/24 1647) BP: (111-155)/(74-97) 137/94 (08/24 1647) SpO2:  [98 %-100 %] 100 % (08/24 1647) Weight:  [31.9 kg] 31.9 kg (08/24 1118) Physical Exam: General: Awake, alert, cachectic, frail, no acute distress, not oriented to date Cardiovascular: RRR, no murmurs auscultated Respiratory: CTA B, no increased work of breathing Abdomen: Soft, Tina Kennedy abdomen, normoactive bowel sounds Extremities: Cachectic, able to move all 4 extremities  Laboratory: Recent Labs  Lab 12/01/20 1439 12/02/20 0339  WBC 4.0 4.3  HGB 12.3 11.2*  HCT 36.5 32.2*  PLT 99* 82*   Recent Labs  Lab 12/01/20 1439 12/02/20 0339  NA 139 141  K 4.2 4.1  CL 102 107  CO2 19* 22  BUN 39* 37*  CREATININE 1.84* 1.66*  CALCIUM 9.2 8.6*  PROT 6.8 6.4*  BILITOT 1.6* 1.4*  ALKPHOS 80 66  ALT 16 15  AST 27 22  GLUCOSE 75 74   Mag: 2.2 wnl Phosphorus: 3.4 wnl TSH 1.073 wnl Vit B12: 458 wnl Folate: 4.4 decreased  Imaging/Diagnostic Tests: CT soft tissue neck w/o contrast: Compared to the CT neck from 09/13/2019:   1. Interval resolution of the marked proximal esophageal wall thickening. 2. Resolved enlarged right retropharyngeal lymph node. No new or progressive cervical lymphadenopathy.  CT chest w/o contrast: 1. Cachectic. No metastatic disease identified in the absence of  IV contrast. A 12 mm mostly sub solid right lung nodule is stable since July 2021 PET-CT. 2. Subacute appearing right rib fractures 5 through 7, the 5th rib is fractured in two places (flail segment). Chronic left side rib fractures are stable. 3. Calcified coronary artery and Aortic Atherosclerosis  Wells Guiles, DO 12/02/2020, Peter Garter PM PGY-1, [place] Intern pager: [phone], text pages welcome

## 2020-12-01 NOTE — ED Triage Notes (Signed)
"  I am just weak" per patient.  Patient denies any other complaints

## 2020-12-02 ENCOUNTER — Inpatient Hospital Stay (HOSPITAL_COMMUNITY): Payer: Medicare Other

## 2020-12-02 DIAGNOSIS — C139 Malignant neoplasm of hypopharynx, unspecified: Secondary | ICD-10-CM

## 2020-12-02 DIAGNOSIS — R634 Abnormal weight loss: Secondary | ICD-10-CM | POA: Diagnosis not present

## 2020-12-02 DIAGNOSIS — R531 Weakness: Secondary | ICD-10-CM | POA: Diagnosis not present

## 2020-12-02 DIAGNOSIS — Z66 Do not resuscitate: Secondary | ICD-10-CM

## 2020-12-02 DIAGNOSIS — E860 Dehydration: Secondary | ICD-10-CM

## 2020-12-02 LAB — COMPREHENSIVE METABOLIC PANEL
ALT: 15 U/L (ref 0–44)
AST: 22 U/L (ref 15–41)
Albumin: 3 g/dL — ABNORMAL LOW (ref 3.5–5.0)
Alkaline Phosphatase: 66 U/L (ref 38–126)
Anion gap: 12 (ref 5–15)
BUN: 37 mg/dL — ABNORMAL HIGH (ref 8–23)
CO2: 22 mmol/L (ref 22–32)
Calcium: 8.6 mg/dL — ABNORMAL LOW (ref 8.9–10.3)
Chloride: 107 mmol/L (ref 98–111)
Creatinine, Ser: 1.66 mg/dL — ABNORMAL HIGH (ref 0.44–1.00)
GFR, Estimated: 33 mL/min — ABNORMAL LOW (ref >60)
Glucose, Bld: 74 mg/dL (ref 70–99)
Potassium: 4.1 mmol/L (ref 3.5–5.1)
Sodium: 141 mmol/L (ref 135–145)
Total Bilirubin: 1.4 mg/dL — ABNORMAL HIGH (ref 0.3–1.2)
Total Protein: 6.4 g/dL — ABNORMAL LOW (ref 6.5–8.1)

## 2020-12-02 LAB — CBC
HCT: 32.2 % — ABNORMAL LOW (ref 36.0–46.0)
Hemoglobin: 11.2 g/dL — ABNORMAL LOW (ref 12.0–15.0)
MCH: 31.9 pg (ref 26.0–34.0)
MCHC: 34.8 g/dL (ref 30.0–36.0)
MCV: 91.7 fL (ref 80.0–100.0)
Platelets: 82 10*3/uL — ABNORMAL LOW (ref 150–400)
RBC: 3.51 MIL/uL — ABNORMAL LOW (ref 3.87–5.11)
RDW: 13.2 % (ref 11.5–15.5)
WBC: 4.3 10*3/uL (ref 4.0–10.5)
nRBC: 0.7 % — ABNORMAL HIGH (ref 0.0–0.2)

## 2020-12-02 LAB — FOLATE: Folate: 4.4 ng/mL — ABNORMAL LOW (ref >5.9)

## 2020-12-02 LAB — PHOSPHORUS: Phosphorus: 3.4 mg/dL (ref 2.5–4.6)

## 2020-12-02 LAB — TSH: TSH: 1.073 u[IU]/mL (ref 0.350–4.500)

## 2020-12-02 LAB — VITAMIN B12: Vitamin B-12: 458 pg/mL (ref 180–914)

## 2020-12-02 LAB — MAGNESIUM: Magnesium: 2.2 mg/dL (ref 1.7–2.4)

## 2020-12-02 MED ORDER — GADOBUTROL 1 MMOL/ML IV SOLN
3.0000 mL | Freq: Once | INTRAVENOUS | Status: AC | PRN
  Administered 2020-12-02: 3 mL via INTRAVENOUS

## 2020-12-02 MED ORDER — CHLORHEXIDINE GLUCONATE CLOTH 2 % EX PADS
6.0000 | MEDICATED_PAD | Freq: Every day | CUTANEOUS | Status: DC
  Administered 2020-12-02 – 2020-12-04 (×3): 6 via TOPICAL

## 2020-12-02 MED ORDER — DEXTROSE-NACL 5-0.45 % IV SOLN: INTRAVENOUS | Status: DC

## 2020-12-02 NOTE — Progress Notes (Signed)
FPTS Brief Progress Note  S: Patient is doing well with no complaints.  She reports she has really bad eyesight and does not currently have her glasses with her to her knowledge.  O: BP 127/90 (BP Location: Left Arm)   Pulse 71   Temp 98 F (36.7 C)   Resp 18   Ht '5\' 4"'$  (1.626 m)   Wt 31.9 kg   SpO2 100%   BMI 12.07 kg/m   General: NAD, resting comfortably in bed   A/P: Plan per day team  -Attempted to get a MoCA for the patient. However, it was not possible due to her inability to see.  - Orders reviewed. Labs for AM not ordered, which was adjusted as needed-BMP and CBC for the morning.    Erskine Emery, MD 12/02/2020, 9:35 PM PGY-1, [facility] Health Family Medicine Night Resident  Please page [phone] with questions.

## 2020-12-02 NOTE — Progress Notes (Signed)
Family Medicine Teaching Service Daily Progress Note Intern Pager: [phone]  Patient name: Tina Kennedy Medical record number: [id number] Date of birth: [birth year] Age: 70 y.o. Gender: female  Primary Care Provider: Patient, No Pcp Per (Inactive) Consultants: Cardiology, Heme-onc, Palliative Code Status: DNR  Pt Overview and Major Events to Date:  8/23: admitted 8/24: CT negative for cancer recurrence  Assessment and Plan: Tina Kennedy is a 70 y/o F presenting with weakness over last 3-4 weeks and significant weight loss between 30-40 lbs in the last month. 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 Gastritis, GERD.   Generalized weakness  AMS Pt was initially diagnosed 10/15/19 with stage 3 SCC of hypopharynx followed by Dr. Alen Blew last seen at [facility] 07/07/20. Remission since PET scan on 03/2020. CXR, head/neck/chest CT unremarkable since admission. No current suspicion for recurrence. Infectious, bleeding, trauma etiology ruled out at this time. Glucose, TSH, B12 normal. Pt appears to interpret chronology poorly. Leading on differential at this time is dementia. Pt lives alone and ambulates poorly. Concern for lack of self-care causing neglect of nutritional needs.  Adjusted MoCA performed without additional complaints.  Scored 10/24.  Even with visual components perfect, would have scored 16/30 which is correlated to moderate degree impairment.  Patient's living will does allude to the fact that she would not want her life prolonged by artificial nutrition/hydration if she has an incurable condition.  Patient is overall condition appears to be to the point of end-of-life given excess fatigue, disinterest in eating, deteriorating mental status. -Palliative care  on board, appreciate assistance  History of dysphagia  severe malnutrition/failure to thrive Presented with BMI 12. Electrolytes wnl on presentation including magnesium and phosphorus. Concern for refeeding syndrome at this time. -SLP evaluation today  AKI Likely prerenal c/w malnutrition. Creatinine at 1.84 on admission, baseline 0.91. Creatinine now 1.14 after 2 days of continuous fluids. Currently receiving D5 1/2NS at 134m/hr and NPO due to unknown swallowing status.  Goals of care discussion Sister is HCPOA. Palliative care is onboard and having continued discussions with sister. Recently changed to DNR status.   Hyperkalemia Potassium 2.9 today.  Possibly delusional with hypomagnesemia component.  Magnesium previously normal at 2.2.  Concern for refeeding syndrome at this time. -Replete with 10 mEq K x6 IV -Recheck renal function panel and magnesium -Consult nutrition  Thrombocytopenia Thrombocytopenia suspected to be caused by minimal nutrients.  Unlikely to be HIT as thrombocytopenia was present before placed on Lovenox.  Patient is not currently on other medications that may cause drug-induced thrombocytopenia. -Discontinue Lovenox, start SCDs  FEN/GI: NPO until swallow study PPx: SCDs Dispo:SNF . Barriers include ongoing palliative care discussion.   Subjective:  Patient was awoken for rounding.  States she has not been hungry and is interested in sleeping.  Did not have any questions or concerns  Objective: Temp:  [97.4 F (36.3 C)-98.2 F (36.8 C)] 98 F (36.7 C) (08/24 2021) Pulse Rate:  [65-83] 71 (08/24 2021) Resp:  [13-24] 18 (08/24 2021) BP: (116-155)/(74-95) 127/90 (08/24 2021) SpO2:  [100 %] 100 % (08/24 2021) Weight:  [31.9 kg] 31.9 kg (08/24 1118) Physical Exam: General: Awake, alert, fatigued, no acute distress, cachectic and frail Cardiovascular: RRR, no murmurs auscultated Respiratory: CTA B, no wheezing or crackles Abdomen: Soft, nontender,  normoactive bowel sounds Extremities: Cachectic  Laboratory: Recent Labs  Lab 12/01/20 1439 12/02/20 0339  WBC 4.0 4.3  HGB 12.3 11.2*  HCT 36.5 32.2*  PLT 99* 82*   Recent Labs  Lab 12/01/20 1439 12/02/20 0339  NA 139 141  K 4.2 4.1  CL 102 107  CO2 19* 22  BUN 39* 37*  CREATININE 1.84* 1.66*  CALCIUM 9.2 8.6*  PROT 6.8 6.4*  BILITOT 1.6* 1.4*  ALKPHOS 80 66  ALT 16 15  AST 27 22  GLUCOSE 75 74   Imaging/Diagnostic Tests: MR brain w/wo contrast: No acute intracranial abnormality. Negative for metastatic disease. Negative for acute infarct   Moderate atrophy. Mild white matter changes consistent with chronic microvascular ischemia.  Wells Guiles, DO 12/02/2020, 9:06 PM PGY-1, [place] Intern pager: [phone], text pages welcome

## 2020-12-02 NOTE — ED Notes (Signed)
Attempted to give reportx1 

## 2020-12-02 NOTE — ED Notes (Signed)
Pt placed on purewic 

## 2020-12-02 NOTE — Progress Notes (Signed)
Pt sister [name] (healthcare power of attorney) wants to be notified before decisions are made  and for updates of care. Number is in the chart.

## 2020-12-02 NOTE — Progress Notes (Signed)
NEW ADMISSION NOTE New Admission Note:   Arrival Method: stretcher  Mental Orientation: A&O X4 Telemetry: 5M19 Assessment: Completed Skin: intact dry and flaky IV: port right chest Pain: 0/10 Tubes: none Safety Measures: Safety Fall Prevention Plan has been given, discussed and signed Admission: Completed [facility] Orientation: Patient has been orientated to the room, unit and staff.  Family:  at bedside  Orders have been reviewed and implemented. Will continue to monitor the patient. Call light has been placed within reach and bed alarm has been activated.   Devron Cohick S Manju Kulkarni, RN

## 2020-12-02 NOTE — Progress Notes (Signed)
   12/02/20 1626  ECG Monitoring  Cardiac Rhythm SVT (17 bts-RN Kaye Mitro notified @ 1628)   Dr. Curly Rim and notified. Pt not symptomatic. Pt in NSR will continue to monitor.

## 2020-12-02 NOTE — Progress Notes (Signed)
Interim Progress Note  Discussed case with Dr. Alen Blew, patients oncologist. He suggests that we continue work up for cancer recurrence with CT neck and CT chest. Can be done without contrast if her kidney function is worsened (which it is).   Will place orders.

## 2020-12-02 NOTE — Progress Notes (Signed)
Daily Progress Note   Patient Name: Tina Kennedy       Date: 12/02/2020 DOB: 10/30/1950  Age: 70 y.o. MRN#: [id number] Attending Physician: Lind Covert, MD Primary Care Physician: Patient, No Pcp Per (Inactive) Admit Date: 12/01/2020  Reason for Consultation/Follow-up: goals of care  Subjective: Update received from bedside RN. She reports that sister stated to her today that patient would want DNR status.   I spoke with sister Tina Kennedy by phone. I reviewed with her the results of the CT scans of the neck and abdomen, which do not show a recurrence of cancer. Discussed that while this is good news, patient is still in a very debilitated and frail state. Her functional and nutritional status has declined significantly over the past few months. Discussed that patient's clinical presentation is consistent with a possible diagnosis of dementia. Detailed discussion was had regarding the diagnosis of dementia and its natural trajectory. I provided education that dementia is a progressive, non-curable disease underlying the patient's current acute medical conditions. We reviewed specific indicators of end stage dementia, including bed bound/non-ambulatory status, decreased oral intake, and  incontinence of bowel/bladder.  Discussed the issue of patient's severe malnutrition and decreased oral intake. Provided education that decreased oral intake in the setting of advanced illness is often a sign of end of life. Additional discussion was had around the issue of artificial feeding. Discussed that artifical feeding is not shown to prolong life or promote quality of life for patients with advanced illness, especially dementia. Tina Kennedy is receptive of this information but also states "we  can't let her just not eat".  Of note, patient's living will does elude to the fact she would not want her life prolonged by artificial nutrition/hydration if she has an incurable condition.   I did discuss code status with Tina Kennedy. Confirmed that she is agreeable to DNR/DNI with understanding that in the event of cardiopulmonary arrest, resuscitation efforts (CPR, defibrillation, ACLS medications, intubation) would not be initiated. Emphasized that DNR/DNI is a protective measure to keep [place] from harming the patient in their last moments of life.     Length of Stay: 1   Vital Signs: BP (!) 137/94   Pulse 72   Temp (!) 97.4 F (36.3 C) (Oral)   Resp 18   Ht '5\' 4"'$  (1.626  m)   Wt 31.9 kg   SpO2 100%   BMI 12.07 kg/m  SpO2: SpO2: 100 % O2 Device: O2 Device: Room Air O2 Flow Rate: O2 Flow Rate (L/min): 4 L/min  Intake/output summary:  Intake/Output Summary (Last 24 hours) at 12/02/2020 1934 Last data filed at 12/02/2020 1900 Gross per 24 hour  Intake 369.2 ml  Output --  Net 369.2 ml   LBM:   Baseline Weight: Weight: 31.8 kg Most recent weight: Weight: 31.9 kg       Palliative Assessment/Data: PPS 10% (NPO status)      Palliative Care Assessment & Plan   HPI/Patient Profile: 70 y.o. female  with past medical history of hypopharyngeal squamous cell carcinoma diagnosed July 2021 and status post chemotherapy and radiation now presenting to [facility] emergency department on 12/01/2020 with concern for generalized weakness.  She was last seen by oncology in March 2021 with plan for observation and surveillance. She was recommended to follow-up in May, but did not show up for this appointment.  In the ED, labs significant for creatinine of 1.84 and troponin of 77. CT head negative for acute findings but does show mild global parenchymal volume loss and chronic white matter microangiopathy. Chest x-ray negative for acute findings.   Assessment: - generalized weakness - severe  malnutrition - history of head and neck cancer with no current suspicion for recurrence - confusion, ?dementia  Recommendations/Plan: Continue current medical care with watchful waiting Advanced directive documents have been reviewed and will be scanned into [employer] Sister will need ongoing education and discussion around patient's decreased intake/malnutrition PMT will continue to follow  Goals of Care and Additional Recommendations: Limitations on Scope of Treatment: Full Scope Treatment  Code Status: DNR/DNI  Prognosis:  Unable to determine  Discharge Planning: To Be Determined  Care plan was discussed with Dr. Madison Hickman  Thank you for allowing the Palliative Medicine Team to assist in the care of this patient.   Total Time 65 minutes Prolonged Time Billed  yer       Greater than 50%  of this time was spent counseling and coordinating care related to the above assessment and plan.  Lavena Bullion, NP  Please contact Palliative Medicine Team phone at [phone] for questions and concerns.

## 2020-12-02 NOTE — Evaluation (Signed)
Occupational Therapy Evaluation Patient Details Name: Tina Kennedy MRN: [id number] DOB: Dec 20, 1950 Today's Date: 12/02/2020    History of Present Illness Pt is a 70yo female presenting [facility] ED on 8/23 with reports of generalized weakness. Imaging negative for acute findings. Pt reports significant weight loss. 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   Clinical Impression   PTA, pt lives alone and reports Independence with ADLs/mobility but noted with increasing difficulty caring for self per family. Pt presents now with significant impairments in strength, endurance and balance. Pt overall Min A for bed mobility but unable to tolerate sitting EOB long enough for BP reading or ADL tasks due to fatigue. Pt requires Mod A for UB ADLs and Total A for LB ADLs bed level, including peri care during session today. Pt's sister present and interested in rehab to hopefully improve strength. Will continue to follow acutely.   BP lying 137/83 BP sitting: 118/92    Follow Up Recommendations  SNF;Supervision/Assistance - 24 hour    Equipment Recommendations  Hospital bed    Recommendations for Other Services       Precautions / Restrictions Precautions Precautions: Fall;Other (comment) Precaution Comments: monitor orthostatic BPs, subacute rib fx (does not appear to be painful) Restrictions Weight Bearing Restrictions: No      Mobility Bed Mobility Overal bed mobility: Needs Assistance Bed Mobility: Rolling;Supine to Sit;Sit to Supine Rolling: Supervision   Supine to sit: Min assist Sit to supine: Min assist   General bed mobility comments: Able to roll to assist with peri care after purewick malfunction. Min A for handheld assist to lift trunk to sit EOB, Min A to get LE back into bed    Transfers                 General transfer comment: unable    Balance Overall balance  assessment: Needs assistance Sitting-balance support: Single extremity supported;Feet unsupported Sitting balance-Leahy Scale: Poor Sitting balance - Comments: reliant on UE support EOB, R lateral lean/drift due to quick fatigue                                   ADL either performed or assessed with clinical judgement   ADL Overall ADL's : Needs assistance/impaired Eating/Feeding: Minimal assistance;Sitting   Grooming: Moderate assistance;Sitting   Upper Body Bathing: Moderate assistance;Bed level   Lower Body Bathing: Total assistance;Bed level   Upper Body Dressing : Moderate assistance;Bed level   Lower Body Dressing: Total assistance;Bed level       Toileting- Clothing Manipulation and Hygiene: Total assistance;Bed level Toileting - Clothing Manipulation Details (indicate cue type and reason): purewick malfunction, Total A for clean up bed level       General ADL Comments: Severely impaired endurance/strength, unable to sit EOB long enough to assess BP or participate in ADLs     Vision Ability to See in Adequate Light: 0 Adequate Patient Visual Report: No change from baseline Vision Assessment?: No apparent visual deficits     Perception     Praxis      Pertinent Vitals/Pain Pain Assessment: Faces Faces Pain Scale: Hurts a little bit Pain Location: generalized with movement Pain Descriptors / Indicators: Discomfort;Grimacing;Moaning Pain Intervention(s): Monitored during session;Limited activity within patient's tolerance     Hand Dominance Right   Extremity/Trunk Assessment Upper Extremity Assessment Upper Extremity Assessment: Generalized weakness (very weak)  Lower Extremity Assessment Lower Extremity Assessment: Defer to PT evaluation   Cervical / Trunk Assessment Cervical / Trunk Assessment: Kyphotic   Communication Communication Communication: No difficulties   Cognition Arousal/Alertness: Awake/alert Behavior During Therapy:  Flat affect Overall Cognitive Status: Impaired/Different from baseline Area of Impairment: Orientation;Memory;Awareness;Problem solving                 Orientation Level: Disoriented to;Time;Situation   Memory: Decreased short-term memory     Awareness: Emergent Problem Solving: Slow processing;Requires verbal cues General Comments: Conflicting PLOF reports (confirmed with sister who was present); able to acknowledge need to urinate, etc. Unaware of deficits, reports she can stand and get around though unable to maintain sitting balance EOB   General Comments  Pt appearing very malnourished. Sister Tina Kennedy present, asking about palliative care visit follow-up    Exercises     Shoulder Instructions      Home Living Family/patient expects to be discharged to:: Private residence Living Arrangements: Alone Available Help at Discharge: Family;Available PRN/intermittently Type of Home: House Home Access: Stairs to enter [employer] of Steps: 4 Entrance Stairs-Rails: None Home Layout: One level     Bathroom Shower/Tub: Occupational psychologist: Standard (toilet riser)     Home Equipment: Toilet riser;Cane - single point;Shower seat   Additional Comments: Pt's sister Tina Kennedy provided most of home environment information though conflicting info noted. Reports family live nearby but unable to consistently provide 24/7 assist      Prior Functioning/Environment Level of Independence: Needs assistance  Gait / Transfers Assistance Needed: Pt reports she is able to walk; Pt sister reports "last time I saw her walk was May 2022". Sister reports pt with difficulty getting up, unable to get up prior to admission ADL's / Homemaking Assistance Needed: Pt reports she accomplishes ADLs independently though sister is unsure of accuracy. pt reports wearing depends. Pt reports that she cooks, etc but sister denies pt ability to do this recently   Comments: Pt's cognitive  status limits accuracy of prior level of function information.        OT Problem List: Decreased strength;Decreased activity tolerance;Impaired balance (sitting and/or standing);Decreased cognition;Decreased safety awareness;Decreased knowledge of use of DME or AE      OT Treatment/Interventions: Self-care/ADL training;Therapeutic exercise;Energy conservation;DME and/or AE instruction;Therapeutic activities;Patient/family education    OT Goals(Current goals can be found in the care plan section) Acute Rehab OT Goals Patient Stated Goal: would like for pt to go to rehab to get stronger OT Goal Formulation: With patient/family Time For Goal Achievement: 12/16/20 Potential to Achieve Goals: Fair  OT Frequency: Min 2X/week   Barriers to D/C:            Co-evaluation              AM-PAC OT "6 Clicks" Daily Activity     Outcome Measure Help from another person eating meals?: A Little Help from another person taking care of personal grooming?: A Little Help from another person toileting, which includes using toliet, bedpan, or urinal?: Total Help from another person bathing (including washing, rinsing, drying)?: A Lot Help from another person to put on and taking off regular upper body clothing?: A Lot Help from another person to put on and taking off regular lower body clothing?: Total 6 Click Score: 12   End of Session Nurse Communication: Mobility status;Other (comment) (request for palliative follow up)  Activity Tolerance: Patient limited by fatigue Patient left: in bed;with family/visitor present;Other (comment) (transport taking  pt to MRI)  OT Visit Diagnosis: Unsteadiness on feet (R26.81);Other abnormalities of gait and mobility (R26.89);Muscle weakness (generalized) (M62.81);History of falling (Z91.81);Other symptoms and signs involving cognitive function;Adult, failure to thrive (R62.7)                Time: [id number] OT Time Calculation (min): 22 min Charges:  OT  General Charges $OT Visit: 1 Visit OT Evaluation $OT Eval Moderate Complexity: 1 Mod  Malachy Chamber, OTR/L Acute Rehab Services Office: [phone]   Layla Maw 12/02/2020, 2:41 PM

## 2020-12-02 NOTE — Progress Notes (Signed)
Spoke with Alfred Levins, NP for cardiology regarding elevated troponins. Pt has no chest pain. Elevated troponins likely AKI. No need for cardiology involvement right now. Pt was not discharged on ASA post NSTEMI, only metoprolol 12.'5mg'$  which she is not taking.   Lattie Haw MD PGY-3, [place] [facility]

## 2020-12-03 DIAGNOSIS — E878 Other disorders of electrolyte and fluid balance, not elsewhere classified: Secondary | ICD-10-CM | POA: Diagnosis not present

## 2020-12-03 DIAGNOSIS — R634 Abnormal weight loss: Secondary | ICD-10-CM | POA: Diagnosis not present

## 2020-12-03 LAB — CBC
HCT: 31.6 % — ABNORMAL LOW (ref 36.0–46.0)
Hemoglobin: 11.4 g/dL — ABNORMAL LOW (ref 12.0–15.0)
MCH: 31.9 pg (ref 26.0–34.0)
MCHC: 36.1 g/dL — ABNORMAL HIGH (ref 30.0–36.0)
MCV: 88.5 fL (ref 80.0–100.0)
Platelets: 54 10*3/uL — ABNORMAL LOW (ref 150–400)
RBC: 3.57 MIL/uL — ABNORMAL LOW (ref 3.87–5.11)
RDW: 12.8 % (ref 11.5–15.5)
WBC: 2.7 10*3/uL — ABNORMAL LOW (ref 4.0–10.5)
nRBC: 0.7 % — ABNORMAL HIGH (ref 0.0–0.2)

## 2020-12-03 LAB — RENAL FUNCTION PANEL
Albumin: 2.7 g/dL — ABNORMAL LOW (ref 3.5–5.0)
Anion gap: 10 (ref 5–15)
BUN: 22 mg/dL (ref 8–23)
CO2: 19 mmol/L — ABNORMAL LOW (ref 22–32)
Calcium: 8.1 mg/dL — ABNORMAL LOW (ref 8.9–10.3)
Chloride: 107 mmol/L (ref 98–111)
Creatinine, Ser: 1.09 mg/dL — ABNORMAL HIGH (ref 0.44–1.00)
GFR, Estimated: 55 mL/min — ABNORMAL LOW (ref >60)
Glucose, Bld: 116 mg/dL — ABNORMAL HIGH (ref 70–99)
Phosphorus: 1.1 mg/dL — ABNORMAL LOW (ref 2.5–4.6)
Potassium: 4 mmol/L (ref 3.5–5.1)
Sodium: 136 mmol/L (ref 135–145)

## 2020-12-03 LAB — RPR: RPR Ser Ql: NONREACTIVE

## 2020-12-03 LAB — BASIC METABOLIC PANEL
Anion gap: 10 (ref 5–15)
BUN: 26 mg/dL — ABNORMAL HIGH (ref 8–23)
CO2: 19 mmol/L — ABNORMAL LOW (ref 22–32)
Calcium: 7.9 mg/dL — ABNORMAL LOW (ref 8.9–10.3)
Chloride: 107 mmol/L (ref 98–111)
Creatinine, Ser: 1.14 mg/dL — ABNORMAL HIGH (ref 0.44–1.00)
GFR, Estimated: 52 mL/min — ABNORMAL LOW (ref >60)
Glucose, Bld: 172 mg/dL — ABNORMAL HIGH (ref 70–99)
Potassium: 2.9 mmol/L — ABNORMAL LOW (ref 3.5–5.1)
Sodium: 136 mmol/L (ref 135–145)

## 2020-12-03 LAB — FOLATE: Folate: 3.6 ng/mL — ABNORMAL LOW (ref >5.9)

## 2020-12-03 LAB — MAGNESIUM: Magnesium: 2 mg/dL (ref 1.7–2.4)

## 2020-12-03 MED ORDER — SODIUM PHOSPHATES 45 MMOLE/15ML IV SOLN
40.0000 mmol | Freq: Once | INTRAVENOUS | Status: AC
  Administered 2020-12-04: 40 mmol via INTRAVENOUS
  Filled 2020-12-03: qty 13.33

## 2020-12-03 MED ORDER — ADULT MULTIVITAMIN W/MINERALS CH
1.0000 | ORAL_TABLET | Freq: Every day | ORAL | Status: DC
  Administered 2020-12-03: 1 via ORAL
  Filled 2020-12-03: qty 1

## 2020-12-03 MED ORDER — ENSURE ENLIVE PO LIQD
237.0000 mL | Freq: Two times a day (BID) | ORAL | Status: DC
  Administered 2020-12-03: 237 mL via ORAL

## 2020-12-03 MED ORDER — FOLIC ACID 1 MG PO TABS
1.0000 mg | ORAL_TABLET | Freq: Every day | ORAL | Status: DC
  Administered 2020-12-03: 1 mg via ORAL
  Filled 2020-12-03: qty 1

## 2020-12-03 MED ORDER — POTASSIUM CHLORIDE 10 MEQ/100ML IV SOLN
10.0000 meq | INTRAVENOUS | Status: AC
  Administered 2020-12-03 (×6): 10 meq via INTRAVENOUS
  Filled 2020-12-03 (×5): qty 100

## 2020-12-03 MED ORDER — THIAMINE HCL 100 MG PO TABS
100.0000 mg | ORAL_TABLET | Freq: Every day | ORAL | Status: DC
  Administered 2020-12-03: 100 mg via ORAL
  Filled 2020-12-03: qty 1

## 2020-12-03 MED ORDER — ONDANSETRON HCL 4 MG/2ML IJ SOLN
4.0000 mg | Freq: Once | INTRAMUSCULAR | Status: AC
  Administered 2020-12-03: 4 mg via INTRAVENOUS
  Filled 2020-12-03: qty 2

## 2020-12-03 NOTE — Progress Notes (Signed)
Physical Therapy Treatment Patient Details Name: Tina Kennedy MRN: [id number] DOB: 14-Feb-1951 Today's Date: 12/03/2020    History of Present Illness Pt is a 70yo female presenting to [facility] ED on 8/23 with reports of generalized weakness. Imaging negative for acute findings. Pt reports significant weight loss. Per notes, working diagnosis is dementia. 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    PT Comments    Pt with slow progression towards goals. Remains limited secondary to + orthostatics. Required min A for bed mobility and mod A to stand. BP in supine at 118/89, in sitting at 103/87, and following standing 94/72. Pt able to perform supine HEP. Current recommendations appropriate. Will continue to follow acutely.    Follow Up Recommendations  SNF;Supervision/Assistance - 24 hour     Equipment Recommendations  Rolling walker with 5" wheels;3in1 (PT);Wheelchair (measurements PT);Wheelchair cushion (measurements PT)    Recommendations for Other Services       Precautions / Restrictions Precautions Precautions: Fall;Other (comment) Precaution Comments: monitor orthostatic BPs, subacute rib fx (does not appear to be painful) Restrictions Weight Bearing Restrictions: No    Mobility  Bed Mobility Overal bed mobility: Needs Assistance Bed Mobility: Supine to Sit;Sit to Supine;Rolling Rolling: Supervision   Supine to sit: Min assist Sit to supine: Min assist   General bed mobility comments: Able to roll to assist with linen change using bed rails. Min A for handheld assist to lift trunk to sit EOB, Min A to get LE back into bed.    Transfers Overall transfer level: Needs assistance Equipment used: None Transfers: Sit to/from Stand Sit to Stand: Mod assist         General transfer comment: Mod A for lift assist and steadying to stand. Pt able to stand ~1-2 mins prior to  having to return to sitting. BP checked during and was at 94/72.  Ambulation/Gait             General Gait Details: Unable   Stairs             Wheelchair Mobility    Modified Rankin (Stroke Patients Only)       Balance Overall balance assessment: Needs assistance Sitting-balance support: Single extremity supported;Feet unsupported Sitting balance-Leahy Scale: Poor Sitting balance - Comments: reliant on UE support EOB                                    Cognition Arousal/Alertness: Awake/alert Behavior During Therapy: Flat affect Overall Cognitive Status: Impaired/Different from baseline Area of Impairment: Memory;Awareness;Problem solving                     Memory: Decreased short-term memory     Awareness: Emergent Problem Solving: Slow processing;Requires verbal cues        Exercises General Exercises - Lower Extremity Ankle Circles/Pumps: AROM;Both;10 reps Heel Slides: AAROM;Both;5 reps;Supine    General Comments General comments (skin integrity, edema, etc.): Pt's sister present. BP in supine at 118/89, in sitting at 103/87, and following standing 94/72.      Pertinent Vitals/Pain Pain Assessment: Faces Faces Pain Scale: Hurts a little bit Pain Location: generalized with movement Pain Descriptors / Indicators: Discomfort;Grimacing;Moaning Pain Intervention(s): Limited activity within patient's tolerance;Monitored during session;Repositioned    Home Living  Prior Function            PT Goals (current goals can now be found in the care plan section) Acute Rehab PT Goals Patient Stated Goal: "to take a nap" PT Goal Formulation: With patient/family Time For Goal Achievement: 12/15/20 Potential to Achieve Goals: Fair Progress towards PT goals: Progressing toward goals    Frequency    Min 2X/week      PT Plan Current plan remains appropriate    Co-evaluation               AM-PAC PT "6 Clicks" Mobility   Outcome Measure  Help needed turning from your back to your side while in a flat bed without using bedrails?: A Little Help needed moving from lying on your back to sitting on the side of a flat bed without using bedrails?: A Little Help needed moving to and from a bed to a chair (including a wheelchair)?: A Lot Help needed standing up from a chair using your arms (e.g., wheelchair or bedside chair)?: A Lot Help needed to walk in hospital room?: Total Help needed climbing 3-5 steps with a railing? : Total 6 Click Score: 12    End of Session Equipment Utilized During Treatment: Gait belt Activity Tolerance: Treatment limited secondary to medical complications (Comment) (+orthostatics) Patient left: in bed;with call bell/phone within reach;with bed alarm set;with family/visitor present Nurse Communication: Mobility status;Other (comment) (Pt's BP) PT Visit Diagnosis: Muscle weakness (generalized) (M62.81);Adult, failure to thrive (R62.7);Difficulty in walking, not elsewhere classified (R26.2);Unsteadiness on feet (R26.81)     Time: [place]:[id number] PT Time Calculation (min) (ACUTE ONLY): 23 min  Charges:  $Therapeutic Activity: 23-37 mins                     Lou Miner, DPT  Acute Rehabilitation Services  Pager: [phone] Office: [phone]    Rudean Hitt 12/03/2020, 11:35 AM

## 2020-12-03 NOTE — Progress Notes (Signed)
Pt had one episode of emesis after trying to eat.

## 2020-12-03 NOTE — Evaluation (Signed)
Clinical/Bedside Swallow Evaluation Patient Details  Name: Tina Kennedy MRN: [id number] Date of Birth: 1951-01-18  Today's Date: 12/03/2020 Time: SLP Start Time (ACUTE ONLY): 1204 SLP Stop Time (ACUTE ONLY): 1235 SLP Time Calculation (min) (ACUTE ONLY): 31 min  Past Medical History:  Past Medical History:  Diagnosis Date   Hypopharyngeal cancer ([place])    Prolonged QT interval    Past Surgical History:  Past Surgical History:  Procedure Laterality Date   DIRECT LARYNGOSCOPY N/A 10/15/2019   Procedure: DIRECT LARYNGOSCOPY w/BIOPSY;  Surgeon: Izora Gala, MD;  Location: [facility] OR;  Service: ENT;  Laterality: N/A;   GASTROSTOMY N/A 12/04/2019   Procedure: LAPAROSCOPIC G TUBE PLACEMENT;  Surgeon: Ralene Ok, MD;  Location: [facility] ORS;  Service: General;  Laterality: N/A;   IR GASTROSTOMY TUBE MOD SED  11/15/2019   IR IMAGING GUIDED PORT INSERTION  11/15/2019   IR [place] GASTRO/COLONIC TUBE PERCUT W/FLUORO  07/20/2020   LEFT HEART CATH AND CORONARY ANGIOGRAPHY N/A 08/13/2020   Procedure: LEFT HEART CATH AND CORONARY ANGIOGRAPHY;  Surgeon: Nelva Bush, MD;  Location: [facility];  Service: Cardiovascular;  Laterality: N/A;   RIGID ESOPHAGOSCOPY N/A 10/15/2019   Procedure: RIGID ESOPHAGOSCOPY;  Surgeon: Izora Gala, MD;  Location: [facility] OR;  Service: ENT;  Laterality: N/A;   HPI:  Pt is a 70 y/o female who presented to the ED on 8/23 with reports of generalized weakness and wight loss. Pt's sister reported to MD that the pt's oral intake has reduced over the past months, and the "sometimes she will choke on solid food after chewing it". MRI brain & CXR negative for acute findings. CT soft tissue neck 8/24: Interval resolution of the marked proximal esophageal wall thickening. Resolved enlarged right retropharyngeal lymph node. No new or progressive cervical lymphadenopathy. 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 MBS 08/27/19: silent penetration/aspiration thin, penetration NTL, improved with left head turn. Pt received outpatient SLP servised for dyspahagia and was discharged 04/30/20 with HEP and on a dysphagia 3 diet with thin liquids.   Assessment / Plan / Recommendation Clinical Impression  Pt was seen for bedside swallow evaluation with her sisters and nephew present. Pt and her family reported that the pt was consuming dysphagia 3 solids and thin liquids at home. Pt's sisters reported coughing with p.o. intake and reduced intake since June. Oral inspection revealed a white coating on pt's gums and superior lingual surface which was reduced/eliminated with oral care provided by SLP. Pt was edentulous and denied use of dentures. No s/sx of aspiration were noted with solids or liquids. Mastication was significantly prolonged for dysphagia 3 solids and pt's family reported that length of mastication was atypical for the pt. Bolus manipulation was prolonged for dysphagia 1 solids, but functional and oral clearance adequate. Following discussing with family regarding initiation of a dysphagia 2/dysphagia 1 diet, all parties agreed that they would rather the pt be started on dysphagia 1 and pt stated that she believes this would be easier for her. A dysphagia 1 diet with thin liquids is recommended at this time with observance of swallowing precautions. SLP will follow pt to ensure diet tolerance and for diet advancement as clinically indicated. SLP Visit Diagnosis: Dysphagia, unspecified (R13.10)    Aspiration Risk  Mild aspiration risk  Diet Recommendation Dysphagia 1 (Puree);Thin liquid   Liquid Administration via: Cup;Straw Medication Administration: Crushed with puree Supervision: Staff to assist with  self feeding Compensations: Slow rate;Small sips/bites;Minimize environmental distractions Postural Changes: Seated upright at 90 degrees    Other  Recommendations Oral Care Recommendations: Oral care BID;Staff/trained caregiver to provide oral care   Follow up Recommendations  (TBD)      Frequency and Duration min 2x/week  1 week       Prognosis Prognosis for Safe Diet Advancement: Fair Barriers to Reach Goals: Time post onset;Severity of deficits      Swallow Study   General Date of Onset: 08/27/19 HPI: Pt is a 70 y/o female who presented to the ED on 8/23 with reports of generalized weakness and wight loss. Pt's sister reported to MD that the pt's oral intake has reduced over the past months, and the "sometimes she will choke on solid food after chewing it". MRI brain & CXR negative for acute findings. CT soft tissue neck 8/24: Interval resolution of the marked proximal esophageal wall thickening. Resolved enlarged right retropharyngeal lymph node. No new or progressive cervical lymphadenopathy. 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 MBS 08/27/19: silent penetration/aspiration thin, penetration NTL, improved with left head turn. Pt received outpatient SLP servised for dyspahagia and was discharged 04/30/20 with HEP and on a dysphagia 3 diet with thin liquids. Type of Study: Bedside Swallow Evaluation Previous Swallow Assessment: See HPI Diet Prior to this Study: NPO Temperature Spikes Noted: No Respiratory Status: Room air History of Recent Intubation: No Behavior/Cognition: Alert;Cooperative;Pleasant mood Oral Cavity Assessment: Within Functional Limits Oral Care Completed by SLP: Yes Oral Cavity - Dentition:  Edentulous Vision: Functional for self-feeding Self-Feeding Abilities: Able to feed self Patient Positioning: Upright in bed;Postural control adequate for testing Baseline Vocal Quality: Low vocal intensity Volitional Cough: Weak Volitional Swallow: Able to elicit    Oral/Motor/Sensory Function Overall Oral Motor/Sensory Function: Within functional limits   Ice Chips Ice chips: Within functional limits Presentation: Spoon   Thin Liquid Thin Liquid: Within functional limits Presentation: Straw;Cup    Nectar Thick Nectar Thick Liquid: Not tested   Honey Thick Honey Thick Liquid: Not tested   Puree Puree: Impaired Presentation: Spoon Oral Phase Functional Implications: Prolonged oral transit   Solid     Solid: Impaired Presentation: Self Fed Oral Phase Impairments: Impaired mastication Oral Phase Functional Implications: Impaired mastication     Thornton Dohrmann I. Hardin Negus, [place], [place] Office number [phone] Pager [place] 12/03/2020,3:45 PM

## 2020-12-03 NOTE — Progress Notes (Signed)
Initial Nutrition Assessment  DOCUMENTATION CODES:   Underweight, Severe malnutrition in context of chronic illness  INTERVENTION:  -Ensure Enlive po BID, each supplement provides 350 kcal and 20 grams of protein  NUTRITION DIAGNOSIS:   Severe Malnutrition related to chronic illness (dementia) as evidenced by percent weight loss, energy intake < or equal to 75% for > or equal to 1 month, severe fat depletion, severe muscle depletion.  GOAL:   Patient will meet greater than or equal to 90% of their needs  MONITOR:   PO intake, Supplement acceptance, Diet advancement, Weight trends, Labs, I & O's  REASON FOR ASSESSMENT:   Consult Assessment of nutrition requirement/status, Malnutrition Eval  ASSESSMENT:   Pt with PMH significant for Stage 3 SCC of hypopharynx s/p direct laryngoscopy, cisplatin chemo x6 cycles w/ radiation, h/o NSTEMI, chronic hyponatremia, h/o dysphagia w/ PEG placement w/ severe malnutrition (no longer has PEG), h/o Mallory-Weiss tear vs. Gastritis, GERD now presenting with AMS, FTT, and generalized weakness.  Pt last seen by oncology in March 2021 with plan for observation and surveillance. It was recommended pt follow-up in May, but pt did not show up for this appointment.   Pt's functional and nutritional status have declined significantly over the past few months. Per MD, this presentation is consistent with possible diagnosis of dementia as CT is negative for cancer recurrence.   Pt is on a Dysphagia 1 diet with thin liquids, but has had 0% intake x3 recorded meals since admission. PMT assessed pt and noted that pt's living will states that she would not want artificial nutrition if she has an incurable condition. PMT plans to continue to follow. SLP also following.   Reviewed weight history. Pt weighed 41.3 kg on 08/13/20 and now weighs 31.9 kg. This indicates a clinically significant 22.8% weight loss over 3 months.   Medications: MVI with minerals,  thiamine Labs: K+ 2.9 (L), BUN 26 (H), Cr 1.14 (H), Folate 3.6 (L)  UOP: 69m + 1 unmeasured occurrence x24 hours I/O: +1.539msince admit  NUTRITION - FOCUSED PHYSICAL EXAM:  Flowsheet Row Most Recent Value  Orbital Region Severe depletion  Upper Arm Region Severe depletion  Thoracic and Lumbar Region Severe depletion  Buccal Region Severe depletion  Temple Region Severe depletion  Clavicle Bone Region Severe depletion  Clavicle and Acromion Bone Region Severe depletion  Scapular Bone Region Severe depletion  Dorsal Hand Severe depletion  Patellar Region Severe depletion  Anterior Thigh Region Severe depletion  Posterior Calf Region Severe depletion  Edema (RD Assessment) None  Hair Reviewed  Eyes Reviewed  Mouth Reviewed  Skin Reviewed  Nails Reviewed       Diet Order:   Diet Order             DIET - DYS 1 Room service appropriate? Yes with Assist; Fluid consistency: Thin  Diet effective now                   EDUCATION NEEDS:   Not appropriate for education at this time  Skin:  Skin Assessment: Reviewed RN Assessment  Last BM:  PTA  Height:   Ht Readings from Last 1 Encounters:  12/02/20 '5\' 4"'$  (1.626 m)    Weight:  Wt Readings from Last 10 Encounters:  12/02/20 31.9 kg  08/13/20 41.3 kg  07/21/20 44.5 kg  07/07/20 44.4 kg  04/15/20 46.8 kg  04/08/20 44.7 kg  02/04/20 47.1 kg  01/31/20 46.3 kg  01/10/20 47.2 kg  12/26/19 48.8 kg  BMI:  Body mass index is 12.07 kg/m.  Estimated Nutritional Needs:   Kcal:  1300-1500  Protein:  65-75 grams  Fluid:  >1.3L    Larkin Ina, MS, RD, LDN (she/her/hers) RD pager number and weekend/on-call pager number located in [place].

## 2020-12-03 NOTE — Progress Notes (Signed)
Family Medicine Teaching Service Daily Progress Note Intern Pager: [phone]  Patient name: Quinnly Carls Medical record number: [id number] Date of birth: 1951-03-03 Age: 70 y.o. Gender: female  Primary Care Provider: Patient, No Pcp Per (Inactive) Consultants: Cardiology, Heme-onc, palliative Code Status: DNR  Pt Overview and Major Events to Date:  8/23: admitted 8/24: CT negative for cancer recurrence 8/25: continued conversation between palliative and family, emesis episode while eating  Assessment and Plan: Mitra Delores Loany Carmack is a 70 y/o F presenting with weakness 3 to 4 weeks with significant weight loss between 30 to 40 pounds in the last month. PMHx significant for stage III SCC of hypopharynx s/p direct laryngoscopy, cisplatin chemo x6 cycles with radiation, history of NSTEMI, chronic hyponatremia, history of dysphagia with PEG placement severe malnutrition, history of Mallory-Weiss tear versus gastritis, GERD.  Generalized weakness  AMS Patient was initially diagnosed 10/15/2019 with stage III SCC of hypopharynx followed by Dr. Alen Blew last seen at [place] 07/07/2020.  Remission since PET scan on 12/21.  CXR, head/neck/chest CT unremarkable since admission.  No current suspicion for recurrence.  Infectious, bleeding, trauma etiology ruled out at this time.  Glucose, TSH, B12 normal.  Patient appears to interpret chronology poorly.  Patient lives alone and ambulates poorly.  Adjusted MoCA performed without visual components scored 10/24.  Even with visual components perfect, this places patient in moderate degree impairment.  Patient is doing well alludes to patient not desiring life-prolonging artificial nutrition/hydration if she has an incurable condition.  Patient's overall condition appears to be the point of end-of-life given excessive fatigue, disinterest in eating, deteriorating mental status.  She does not currently fit to take care of  self.  Family is currently deciding between SNF/rehab versus comfort care/hospice.  Sister is HC POA. -Palliative care on board for [place] discussion, appreciate assistance   History of dysphagia  severe malnutrition/failure to thrive  refeeding syndrome  folate deficiency Presented with BMI 12.  Electrolytes on presentation within normal limits but now requiring repletion and fluids.  Required repletion of potassium and phosphorus thus far.  Currently receiving D5 1/2NS at 151m/hr. Changed to dysphagia 1 diet SLP evaluation.  Also presenting with folate deficiency.  We will continue to supplement vitamins.  Patient underwent episode of vomiting last night and is unable to tolerate eating.  Placed back on n.p.o. overnight.  Phosphorus was repleted and elevated lab but according to pharmacy, this is due to actively receiving phosphorus.  Abdominal exam unremarkable but concern of upper abdominal obstruction due to patient's emesis being directly after attempting to eat.  Continues investigation of etiology will depend on discussion with family for long-term care goals.  Will get KUB today, and hold on CT or endoscopy at this time pending conversation between family and palliative care. -f/u Mag and Phos recheck -Continue thiamine [id number] daily folic acid 1 mg daily, multivitamin daily -Continue Ensure Plus twice daily - KUB today  AKI: Resolved Likely prerenal in the setting of dehydration/malnutrition.  Creatinine at 1.84 on admission, baseline 0.91.  Creatinine now 1.09.   Pancytopenia Suspected due to malnutrition as patient has not been on medications frequently presented pancytopenic.  FEN/GI: NPO PPx: SCDs Dispo: Pending family decision after palliative medicine discussion    Subjective:  Patient says she slept well overnight and is not feeling hungry at this time.  She desires to sleep more.  Denies any questions or concerns.  Denies pain of any sort.  Objective: Temp:  [[id number]  F (36.7  C)-98.2 F (36.8 C)] 98 F (36.7 C) (08/25 1753) Pulse Rate:  [62-90] 90 (08/25 1753) Resp:  [14-18] 18 (08/25 1753) BP: (101-124)/(81-86) 101/81 (08/25 1753) SpO2:  [99 %-100 %] 99 % (08/25 1753) Physical Exam: General: Sleeping but arousable, able to precipitate participate in conversation, no acute distress Cardiovascular: RRR, no murmurs auscultated Respiratory: CTA B, no increased work of breathing Abdomen: Soft, nontender, shallow abdomen, normoactive bowel sounds Extremities: Cachectic, frail  Laboratory: Recent Labs  Lab 12/01/20 1439 12/02/20 0339 12/03/20 0443  WBC 4.0 4.3 2.7*  HGB 12.3 11.2* 11.4*  HCT 36.5 32.2* 31.6*  PLT 99* 82* 54*   Recent Labs  Lab 12/01/20 1439 12/02/20 0339 12/03/20 0443 12/03/20 1647  NA 139 141 136 136  K 4.2 4.1 2.9* 4.0  CL 102 107 107 107  CO2 19* 22 19* 19*  BUN 39* 37* 26* 22  CREATININE 1.84* 1.66* 1.14* 1.09*  CALCIUM 9.2 8.6* 7.9* 8.1*  PROT 6.8 6.4*  --   --   BILITOT 1.6* 1.4*  --   --   ALKPHOS 80 66  --   --   ALT 16 15  --   --   AST 27 22  --   --   GLUCOSE 75 74 172* 116*    Imaging/Diagnostic Tests: No new imaging/diagnostic tests at this time.  Wells Guiles, DO 12/03/2020, 8:57 PM PGY-1, [place] Intern pager: [phone], text pages welcome

## 2020-12-03 NOTE — Progress Notes (Signed)
Daily Progress Note   Patient Name: Tina Kennedy       Date: 12/03/2020 DOB: [birth year]  Age: 70 y.o. MRN#: [id number] Attending Physician: Martyn Malay, MD Primary Care Physician: Patient, No Pcp Per (Inactive) Admit Date: 12/01/2020  Reason for Consultation/Follow-up: goals of care  Subjective: I met with multiple family members in the 54M waiting room. Present were patient's sister/HCPOA [name], 2 other sisters, niece, and 2 nephews. I introduced myself and the role of Palliative Medicine.   Reviewed at length patient's past medical history, hospital course, and current medical condition. Discussed that imaging has not showed a recurrence of head and neck cancer. Emphasized that while this is good news, patient is still in a very debilitated and frail state. Her functional and nutritional status has declined significantly over the past few months. Discussed that patient's clinical presentation is consistent with a diagnosis of dementia and failure to thrive. Detailed discussion was had regarding the diagnosis of dementia and its natural trajectory. I provided education that dementia is a progressive, non-curable disease. We reviewed specific indicators of end stage dementia, including bed bound/non-ambulatory status, decreased oral intake, and  incontinence of bowel/bladder.   Discussed the issue of patient's severe malnutrition and decreased oral intake. Provided education that decreased oral intake in the setting of advanced illness is often a sign of end of life  Discussed pros and cons of SNF/rehab. Expressed concern that patient will not do well with rehab, as it will not ultimately change her illness trajectory or prognosis.  The difference between full scope medical  intervention and comfort care was considered. Reviewed the concept of a comfort path to family, emphasizing that this path involves de-escalating and stopping full scope medical interventions, allowing a natural course to occur. Discussed that the goal is comfort and dignity rather than prolonging life. Introduced hospice philosophy.   Family expresses appreciation for information and for PMT support. Discussed the importance of continued conversation with family and the medical team regarding overall plan of care and treatment options, ensuring decisions are within the context of the patients values and GOCs.   Length of Stay: 2    Vital Signs: BP 123/83 (BP Location: Left Arm)   Pulse 64   Temp 98.2 F (36.8 C)   Resp 16   Ht $R'5\' 4"'zX$  (  1.626 m)   Wt 31.9 kg   SpO2 100%   BMI 12.07 kg/m  SpO2: SpO2: 100 % O2 Device: O2 Device: Room Air O2 Flow Rate: O2 Flow Rate (L/min): 4 L/min  Intake/output summary:  Intake/Output Summary (Last 24 hours) at 12/03/2020 1458 Last data filed at 12/03/2020 1300 Gross per 24 hour  Intake 1600.86 ml  Output 50 ml  Net 1550.86 ml   LBM:   Baseline Weight: Weight: 31.8 kg Most recent weight: Weight: 31.9 kg       Palliative Assessment/Data: PPS 20%      Palliative Care Assessment & Plan   HPI/Patient Profile: 70 y.o. female  with past medical history of hypopharyngeal squamous cell carcinoma diagnosed July 2021 and status post chemotherapy and radiation now presenting to [facility] emergency department on 12/01/2020 with concern for generalized weakness.  She was last seen by oncology in March 2021 with plan for observation and surveillance. She was recommended to follow-up in May, but did not show up for this appointment.  In the ED, labs significant for creatinine of 1.84 and troponin of 77. CT head negative for acute findings but does show mild global parenchymal volume loss and chronic white matter microangiopathy. Chest x-ray negative for acute  findings.    Assessment: - generalized weakness - severe malnutrition - history of head and neck cancer with no current suspicion for recurrence - confusion, ?dementia  Recommendations/Plan: Continue current plan of care Family is deciding between SNF/rehab versus comfort care/hospice PMT will follow up tomorrow  Goals of Care and Additional Recommendations: Limitations on Scope of Treatment: No Artificial Feeding  Code Status: DNR/DNI  Prognosis:  Likely weeks if natural course occurs  Discharge Planning: To Be Determined  Care plan was discussed with FMTS  Thank you for allowing the Palliative Medicine Team to assist in the care of this patient.   Total Time 67 minutes Prolonged Time Billed  yes       Greater than 50%  of this time was spent counseling and coordinating care related to the above assessment and plan.  Lavena Bullion, NP  Please contact Palliative Medicine Team phone at [phone] for questions and concerns.

## 2020-12-03 NOTE — Hospital Course (Addendum)
Tina Kennedy is a 70 y.o. female presenting with onset of weakness onset 3-4 weeks ago. 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  Hospital course as outlined below.   Generalized Weakness likely 2/2 dementia  Tina Kennedy is a 70 yr old female who presented with 3-4 week history of generalized weakness and decline and significant weight loss.  Initially there was concern for recurrence of the cancer that she has had SCC of the hypopharynx. However, none of the work-up and scans that were performed showed any evidence that the cancer had recurred.  Neck CT head CT chest x-ray and CT were all unremarkable.  The patient has had confusion that has seems to have worsened over the last couple of months.  Vitamin B12 TSH and RPR normal.  Folate was a little low at 4.4. Work-up was done for recurrence of cancer during this admission.  There is no current suspicion for recurrence given the findings.  Multiple etiologies have been ruled out at this time.  Adjusted MoCA performed without visual components was scored 10/24.  Patient has a significant degree of impairment likely 2/2 dementia.  Currently the patient is experiencing severe's fatigue, disinterest in eating, and a deteriorating mental status.  Patient's family and palliative care had goals of care discussions in depth.  Decision was made to continue with full comfort measures.  History of dysphagia  severe malnutrition/failure to thrive  refeeding syndrome  folate deficiency Presented with BMI 12.  Electrolytes on presentation within normal limits but now requiring repletion and fluids.  Required repletion of potassium and phosphorus thus far.  Started receiving D5 1/2NS at  167m/hr. Changed to dysphagia 1 diet SLP evaluation.  Also presenting with folate deficiency.   Patient underwent episode of vomiting after changed to dysphagia 1 diet and was unable to tolerate eating.  Placed on n.p.o. overnight.  Electrolytes were repleted as necessary. Abdominal exam unremarkable.  Investigation of etiology was discontinued due to decision to continue with full comfort measures by family with palliative care team.  Continued with IV fluids at a rate of 30 mL/hour.  Continued with comfort care feeds as well.   Elevated troponins  H/O NSTEMI  Pt denied chest pain on this admission. Pt was admitted in May 2022 for NSTEMI with troponin of 1016.  At that time she had an EKG that showed sinus rhythm with no clear ST elevations, though it was treated as NSTEMI. 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  Echocardiogram on 08/11/2020 showed an ejection fraction of 60 to 65% with grade 1 diastolic dysfunction. On discharge from that admission patient was discharged with metoprolol succinate 12.5 mg daily and Losartan 25 mg daily but has not been taking any medications at this time. Troponins in the ED 77>90>80>85. EKG showed NSR with no ST changes. Spoke to cardiology, elevated trops likely were elevated due to AKI and they did not feel the need to be consulted. Trops were flat, so there was no need to trend them.   AKI, resolved Likely prerenal consistent with malnutrition.  CMP on admission showed electrolytes WNL.  Creatinine 1.84 from baseline 0.91.  Creatinine did trend downward with a final creatinine of 1.09, and GFR 55.  As mentioned above, comfort care measures were started.

## 2020-12-03 NOTE — Progress Notes (Addendum)
FPTS Brief Progress Note  S: Patient had 1 episode of vomiting with day team.  No new episodes of vomiting or nausea per the patient.  However, she does not feel the need to eat and does not want to eat.  Per sister, she has been moaning with urination, like she is in pain.  Patient denies pain with urination but reports that it takes a little bit to start to urinate.   O: BP 98/72 (BP Location: Left Arm)   Pulse 88   Temp 98.4 F (36.9 C) (Oral)   Resp 18   Ht '5\' 4"'$  (1.626 m)   Wt 31.9 kg   SpO2 100%   BMI 12.07 kg/m   General: Patient is resting comfortably in bed, cachectic and malnourished Pulm: Normal WOB MSK: Moving all extremities   A/P: Plan per day team -P.m. labs -phosphorus is 1.1, creatinine was 1.09, corrected calcium 9.1, mag pending -Replace phosphorus-IV phosphorus 40 mmol - Orders reviewed. Labs for AM ordered, which was adjusted as needed.  -D/Ced Heparin for low platelets -Ordered UA given patient's symptoms, afebrile and vital signs are stable  Erskine Emery, MD 12/03/2020, 10:48 PM PGY-1, [facility] Family Medicine Night Resident  Please page [phone] with questions.

## 2020-12-03 NOTE — Progress Notes (Signed)
FMTS Brief Note   Into see patient this afternoon with her sister.  Her diet has been advanced.  The patient took a few bites of food and then rejected most further foods in question why she even need food.  Her mental status continues to wax and wane she provides a variable history. Given presentation, imaging and labs so far I strongly suspect that she has developed dementia whether this be due to vascular dementia or other etiology.  Appreciate palliative care consultation and care.  I discussed with the patient's Sister [name] that did not think that the patient would have significant benefit from rehab if she was unable to sustain her nutritional status.  We will watch her nutritional status.  I suspect that given her marked weight loss in the past few months, decline in her abilities at home and electrolyte abnormalities here that are requiring significant repletion, she would be a hospice candidate and unlikely to benefit from rehabilitation.  I also discussed that I thought the patient be unlikely to benefit from a PEG tube at this time.  The patient has previously actually removed 3 of these and did not want another one placed.  We discussed this and the sister agreed that tube feed was most likely not appropriate at this time nor with the patient benefit from invasive interventions.  Dorris Singh, MD  Family Medicine Teaching Service

## 2020-12-04 ENCOUNTER — Inpatient Hospital Stay (HOSPITAL_COMMUNITY): Payer: Medicare Other

## 2020-12-04 DIAGNOSIS — R638 Other symptoms and signs concerning food and fluid intake: Secondary | ICD-10-CM

## 2020-12-04 DIAGNOSIS — K920 Hematemesis: Secondary | ICD-10-CM

## 2020-12-04 DIAGNOSIS — R531 Weakness: Secondary | ICD-10-CM | POA: Diagnosis not present

## 2020-12-04 DIAGNOSIS — R634 Abnormal weight loss: Secondary | ICD-10-CM | POA: Diagnosis not present

## 2020-12-04 DIAGNOSIS — Z789 Other specified health status: Secondary | ICD-10-CM

## 2020-12-04 DIAGNOSIS — R1113 Vomiting of fecal matter: Secondary | ICD-10-CM | POA: Diagnosis not present

## 2020-12-04 DIAGNOSIS — R54 Age-related physical debility: Secondary | ICD-10-CM

## 2020-12-04 LAB — MAGNESIUM
Magnesium: 1.9 mg/dL (ref 1.7–2.4)
Magnesium: 1.6 mg/dL — ABNORMAL LOW (ref 1.7–2.4)
Magnesium: 1.9 mg/dL (ref 1.7–2.4)

## 2020-12-04 LAB — CBC
HCT: 26.5 % — ABNORMAL LOW (ref 36.0–46.0)
Hemoglobin: 9.6 g/dL — ABNORMAL LOW (ref 12.0–15.0)
MCH: 31.7 pg (ref 26.0–34.0)
MCHC: 36.2 g/dL — ABNORMAL HIGH (ref 30.0–36.0)
MCV: 87.5 fL (ref 80.0–100.0)
Platelets: 45 10*3/uL — ABNORMAL LOW (ref 150–400)
RBC: 3.03 MIL/uL — ABNORMAL LOW (ref 3.87–5.11)
RDW: 12.9 % (ref 11.5–15.5)
WBC: 6.5 10*3/uL (ref 4.0–10.5)
nRBC: 0.6 % — ABNORMAL HIGH (ref 0.0–0.2)

## 2020-12-04 LAB — BASIC METABOLIC PANEL
Anion gap: 9 (ref 5–15)
BUN: 20 mg/dL (ref 8–23)
CO2: 21 mmol/L — ABNORMAL LOW (ref 22–32)
Calcium: 7.4 mg/dL — ABNORMAL LOW (ref 8.9–10.3)
Chloride: 103 mmol/L (ref 98–111)
Creatinine, Ser: 1.09 mg/dL — ABNORMAL HIGH (ref 0.44–1.00)
GFR, Estimated: 55 mL/min — ABNORMAL LOW (ref >60)
Glucose, Bld: 220 mg/dL — ABNORMAL HIGH (ref 70–99)
Potassium: 3.6 mmol/L (ref 3.5–5.1)
Sodium: 133 mmol/L — ABNORMAL LOW (ref 135–145)

## 2020-12-04 LAB — PHOSPHORUS
Phosphorus: 5.6 mg/dL — ABNORMAL HIGH (ref 2.5–4.6)
Phosphorus: 4.7 mg/dL — ABNORMAL HIGH (ref 2.5–4.6)

## 2020-12-04 MED ORDER — GLYCOPYRROLATE 1 MG PO TABS
1.0000 mg | ORAL_TABLET | ORAL | Status: DC | PRN
  Filled 2020-12-04: qty 1

## 2020-12-04 MED ORDER — ONDANSETRON HCL 4 MG/2ML IJ SOLN
4.0000 mg | Freq: Once | INTRAMUSCULAR | Status: AC
  Administered 2020-12-04: 4 mg via INTRAVENOUS
  Filled 2020-12-04: qty 2

## 2020-12-04 MED ORDER — POLYVINYL ALCOHOL 1.4 % OP SOLN: 1.0000 [drp] | Freq: Four times a day (QID) | OPHTHALMIC | Status: DC | PRN

## 2020-12-04 MED ORDER — MAGNESIUM SULFATE IN D5W 1-5 GM/100ML-% IV SOLN
1.0000 g | Freq: Once | INTRAVENOUS | Status: AC
  Administered 2020-12-04: 1 g via INTRAVENOUS
  Filled 2020-12-04: qty 100

## 2020-12-04 MED ORDER — ACETAMINOPHEN 650 MG RE SUPP: 650.0000 mg | Freq: Four times a day (QID) | RECTAL | Status: DC | PRN

## 2020-12-04 MED ORDER — GLYCOPYRROLATE 0.2 MG/ML IJ SOLN: 0.2000 mg | INTRAMUSCULAR | Status: DC | PRN

## 2020-12-04 MED ORDER — SODIUM CHLORIDE 0.9% FLUSH
10.0000 mL | Freq: Two times a day (BID) | INTRAVENOUS | Status: DC
  Administered 2020-12-05: 10 mL

## 2020-12-04 MED ORDER — HALOPERIDOL LACTATE 5 MG/ML IJ SOLN: 2.0000 mg | Freq: Four times a day (QID) | INTRAMUSCULAR | Status: DC | PRN

## 2020-12-04 MED ORDER — BIOTENE DRY MOUTH MT LIQD
15.0000 mL | Freq: Two times a day (BID) | OROMUCOSAL | Status: DC
  Administered 2020-12-05: 15 mL via TOPICAL

## 2020-12-04 MED ORDER — LORAZEPAM 1 MG PO TABS: 1.0000 mg | ORAL_TABLET | ORAL | Status: DC | PRN

## 2020-12-04 MED ORDER — SODIUM CHLORIDE 0.9% FLUSH: 10.0000 mL | INTRAVENOUS | Status: DC | PRN

## 2020-12-04 MED ORDER — MORPHINE SULFATE (PF) 2 MG/ML IV SOLN
2.0000 mg | INTRAVENOUS | Status: DC | PRN
  Administered 2020-12-05 (×2): 2 mg via INTRAVENOUS
  Filled 2020-12-04 (×2): qty 1

## 2020-12-04 MED ORDER — ACETAMINOPHEN 325 MG PO TABS: 650.0000 mg | ORAL_TABLET | Freq: Four times a day (QID) | ORAL | Status: DC | PRN

## 2020-12-04 MED ORDER — ONDANSETRON 4 MG PO TBDP: 4.0000 mg | ORAL_TABLET | Freq: Four times a day (QID) | ORAL | Status: DC | PRN

## 2020-12-04 MED ORDER — LORAZEPAM 2 MG/ML IJ SOLN
1.0000 mg | INTRAMUSCULAR | Status: DC | PRN
  Administered 2020-12-05 (×2): 1 mg via INTRAVENOUS
  Filled 2020-12-04 (×2): qty 1

## 2020-12-04 MED ORDER — LORAZEPAM 2 MG/ML PO CONC: 1.0000 mg | ORAL | Status: DC | PRN

## 2020-12-04 MED ORDER — HALOPERIDOL 1 MG PO TABS
2.0000 mg | ORAL_TABLET | Freq: Four times a day (QID) | ORAL | Status: DC | PRN
  Filled 2020-12-04: qty 2

## 2020-12-04 MED ORDER — PROCHLORPERAZINE EDISYLATE 10 MG/2ML IJ SOLN: 10.0000 mg | INTRAMUSCULAR | Status: DC | PRN

## 2020-12-04 MED ORDER — ENSURE ENLIVE PO LIQD: 237.0000 mL | Freq: Every day | ORAL | Status: DC | PRN

## 2020-12-04 MED ORDER — MAGNESIUM SULFATE 50 % IJ SOLN: 1.0000 g | Freq: Once | INTRAMUSCULAR | Status: DC

## 2020-12-04 MED ORDER — HALOPERIDOL LACTATE 2 MG/ML PO CONC
2.0000 mg | Freq: Four times a day (QID) | ORAL | Status: DC | PRN
  Filled 2020-12-04: qty 1

## 2020-12-04 MED ORDER — ONDANSETRON HCL 4 MG/2ML IJ SOLN: 4.0000 mg | Freq: Four times a day (QID) | INTRAMUSCULAR | Status: DC | PRN

## 2020-12-04 NOTE — Progress Notes (Signed)
Manufacturing engineer [facility]) Hospital Liaison note.    Received request from [place] for family interest in [facility]. Chart and pt information under review by [facility] physician.  Hospice eligibility pending at this time. Visited at bedside and spoke with bedside RN and NT.  Pt did not rouse to voice.  [facility] is unable to offer a room today. Hospital Liaison will follow up tomorrow or sooner if a room becomes available. Please do not hesitate to call with questions.    Thank you for the opportunity to participate in this patient's care.  Domenic Moras, BSN, RN [facility] Liaison (listed on [facility] under Hospice/[facility])    [phone] (24h on call)

## 2020-12-04 NOTE — Progress Notes (Addendum)
Daily Progress Note   Patient Name: Tina Kennedy       Date: 12/04/2020 DOB: 05/30/1950  Age: 70 y.o. MRN#: [id number] Attending Physician: Martyn Malay, MD Primary Care Physician: Patient, No Pcp Per (Inactive) Admit Date: 12/01/2020  Reason for Consultation/Follow-up: Disposition and Establishing goals of care  Subjective: Chart review performed. Noted episodes of vomiting. Received report from primary RN - no acute concerns other than emesis episodes. RN reports patient is not eating/drinking - states she attempted to eat about two teaspoons yesterday but not tolerate.  Went to visit patient at bedside - sister/[name] and Dr. Madison Hickman at bedside. Dr. Madison Hickman providing medical updates/answering questions. KUB was unremarkable and next recommendation, if family wish to pursue, would be endoscopy. Reviewed that, unfortunatly, nothing acute has been found which the medical team feels would be reversible and that patient's decline is likely due to dementia progression.  Met with [name] to continue [place]. Reviewed interval history since PMT visit yesterday. Discussed again the pros/cons of rehab in patient's current state per [name]'s request. Reviewed that to have a positive rehabilitation experience patient would need adequate nutritional intake as well as motivation to work with aggressive therapies. Expressed concern that patient would likely not do well in rehab. Expressed concern over patient's incremental decline in functional status which are in line with dementia disease trajectory. Natural disease trajectory for dementia was discussed.  We reviewed specific indicators of end stage dementia, including inability to communicate, bed bound/non-ambulatory status, decreased oral  intake, swallowing dysfunction, and incontinence of bowel/bladder. Education provided that dementia is a progressive, non-curable disease underlying the patient's current acute medical conditions. Discussed the risks/benefits of pursuing endoscopy - after discussion, [name] decided not to pursue invasive procedures.  Reviewed dispo options as outlined by Gregary Signs NP/PMT yesterday.  [name] states she does not feel patient would benefit from rehab. She was hopeful patient could gain strength through nutritional intake - she expresses disappointment that patient is now experiencing N/V. We discussed the possibilities of patient's N/V. Natural trajectory at EOL was discussed. Therapeutic listening provided as [name] reflects on losing her mother - she states she "has gone through this with her mom" and can see that the patient is approaching EOL. She requests more information on hospice. Provided education and counseling at length on the philosophy and  benefits of hospice care. Discussed that it offers a holistic approach to care in the setting of end-stage illness, and is about supporting the patient where they are allowing nature to take it's course. Discussed the hospice team includes RNs, physicians, social workers, and chaplains. They can provide personal care, support for the family, and help keep patient out of the hospital as well as assist with DME needs for home hospice. Education provided on the difference between home vs residential hospice. In light of patient no longer tolerating oral intake, prognostication was reviewed to likely be less than two weeks. After discussion, [name] would like patient transferred to residential hospice facility, requesting [facility].   We talked about transition to comfort measures in house and what that would entail inclusive of medications to control pain, dyspnea, agitation, nausea, itching, and hiccups. We discussed stopping all unnecessary measures such as blood  draws, needle sticks, oxygen, antibiotics, CBGs/insulin, cardiac monitoring, IVF, and frequent vital signs. [name] is agreeable for transition to full comfort measures in house today, but she would like to continue IVF - she is agreeable to continue them at a lower rate. [name] understands that IVF would be stopped and not continued after transfer to [facility]. Verified DNR/DNI status.  Discussed risk/benefits of comfort feeds vs NPO status - family are ok with comfort feeds and understand risk for aspiration/nausea/vomiting. Discussed managing N/V with comfort medications.  Current COVID19 EOL visitation policy was reviewed.   Therapeutic listening and emotional support provided as [name] reflected on happy memories with the patient as they grew up. [name] explains that patient has recently "started talking to her mom," whom is deceased. We discussed this as another indicator patient is likely approaching EOL. [name] expressed understanding and agreed.  [name] expressed appreciation for visit today.  All questions and concerns addressed. Encouraged to call with questions and/or concerns. PMT card provided.    Length of Stay: 3  Current Medications: Scheduled Meds:  . Chlorhexidine Gluconate Cloth  6 each Topical Daily  . feeding supplement  237 mL Oral BID BM  . folic acid  1 mg Oral Daily  . multivitamin with minerals  1 tablet Oral Daily  . thiamine  100 mg Oral Daily    Continuous Infusions: . dextrose 5 % and 0.45% NaCl 100 mL/hr at 12/04/20 0959    PRN Meds:   Physical Exam Vitals and nursing note reviewed.  Constitutional:      General: She is not in acute distress. Pulmonary:     Effort: No respiratory distress.  Skin:    General: Skin is warm and dry.  Neurological:     Mental Status: She is alert and oriented to person, place, and time.  Psychiatric:        Cognition and Memory: Cognition is impaired. Memory is impaired.            Vital Signs: BP 96/67 (BP  Location: Right Arm)   Pulse 78   Temp (!) 97.5 F (36.4 C) (Oral)   Resp 20   Ht $R'5\' 4"'nU$  (1.626 m)   Wt 31.9 kg   SpO2 95%   BMI 12.07 kg/m  SpO2: SpO2: 95 % O2 Device: O2 Device: Room Air O2 Flow Rate: O2 Flow Rate (L/min): 4 L/min  Intake/output summary:  Intake/Output Summary (Last 24 hours) at 12/04/2020 1405 Last data filed at 12/04/2020 0716 Gross per 24 hour  Intake 2427.47 ml  Output 400 ml  Net 2027.47 ml   LBM:  Baseline Weight: Weight: 31.8 kg Most recent weight: Weight: 31.9 kg       Palliative Assessment/Data: PPS 10-20%      Patient Active Problem List   Diagnosis Date Noted  . Dehydration   . Generalized weakness   . Weight loss   . Prolonged Q-T interval on ECG 08/14/2020  . AKI (acute kidney injury) ([place]) 08/12/2020  . Hyponatremia 08/12/2020  . Hypokalemia 08/12/2020  . Abdominal pain 08/12/2020  . Protein-calorie malnutrition, severe 08/12/2020  . NSTEMI (non-ST elevated myocardial infarction) ([place]) 08/11/2020  . Hyperbilirubinemia 07/21/2020  . Erythrocytosis 07/21/2020  . COVID-19 virus infection 07/21/2020  . Port-A-Cath in place 12/26/2019  . 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 Goals of care, counseling/discussion 10/31/2019  . Hypopharyngeal cancer ([place]) 10/28/2019    Palliative Care Assessment & Plan   Patient Profile: 70 y.o. 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  She was last seen by oncology in March 2021 with plan for observation and surveillance. She was recommended to follow-up in May, but did not show up for this appointment.  In the ED, labs significant for creatinine of 1.84 and troponin of 77. CT head negative for acute findings but does show mild global parenchymal volume loss and chronic white matter  microangiopathy. Chest x-ray negative for acute findings.   Assessment: Generalized weakness Severe malnutrition Failure to thrive AKI Pancytopenia History of head and neck cancer with no current suspicion for recurrence Confusion, ?dementia Terminal care  Recommendations/Plan: Initiated full comfort measures Continue DNR/DNI as previously documented - durable DNR form completed and placed in shadow chart. Copy was made and will be scanned into Vynca/ACP tab Family would like patient transferred to residential hospice facility, requesting [facility]. [facility] does not have beds available today and evaluation is pending Added orders for EOL symptom management and to reflect full comfort measures, as well as discontinued orders that were not focused on comfort Family would like to continue IVF until discharge - they were ok to decreased rate to 55ml/hr and understand IVF will not be continued at hospice facility Family are ok with comfort feeds with understanding of risk for aspiration/nausea/vomiting Unrestricted visitation orders were placed per current [place] EOL visitation policy  Provide frequent assessments and administer PRN medications as clinically necessary to ensure EOL comfort PMT will continue to follow holistically   Goals of Care and Additional Recommendations: Limitations on Scope of Treatment: Full Comfort Care  Code Status:    Code Status Orders  (From admission, onward)           Start     Ordered   12/02/20 1606  Do not attempt resuscitation (DNR)  Continuous       Question Answer Comment  In the event of cardiac or respiratory ARREST Do not call a "code blue"   In the event of cardiac or respiratory ARREST Do not perform Intubation, CPR, defibrillation or ACLS   In the event of cardiac or respiratory ARREST Use medication by any route, position, wound care, and other measures to relive pain and suffering. May use oxygen, suction and  manual treatment of airway obstruction as needed for comfort.      12/02/20 1605           Code Status History     Date Active Date Inactive  Code Status Order ID Comments User Context   12/02/2020 0014 12/02/2020 1605 Full Code [id number]  Lattie Haw, MD ED   12/01/2020 2358 12/02/2020 0014 DNR [id number]  Lattie Haw, MD ED   08/11/2020 2119 08/14/2020 2149 Full Code [id number]  Jonnie Finner, DO ED   07/21/2020 2320 07/23/2020 2053 Full Code [id number]  Kerney Elbe, DO ED       Prognosis:  < 2 weeks in light of patient now not not being able to tolerate PO intake  Discharge Planning: Hospice facility  Care plan was discussed with primary RN, Dr. Alba Cory, Dr. Madison Hickman, [facility] liaison  Thank you for allowing the Palliative Medicine Team to assist in the care of this patient.   Total Time 70 minutes Prolonged Time Billed  yes       Greater than 50%  of this time was spent counseling and coordinating care related to the above assessment and plan.  Lin Landsman, NP  Please contact Palliative Medicine Team phone at [phone] for questions and concerns.

## 2020-12-04 NOTE — Progress Notes (Signed)
CM updated by palliative care that family has decided on residential hospice at [facility]. Palliative care updated Tina Kennedy with [facility].  [facility] doesn't have beds today but they will follow and update on bed availability. TOC following.

## 2020-12-04 NOTE — Care Management Important Message (Signed)
Important Message  Patient Details  Name: Tina Kennedy MRN: [id number] Date of Birth: 07-06-1950   Medicare Important Message Given:  Yes     Sh[place] Forker [address] 12/04/2020, 4:29 PM

## 2020-12-04 NOTE — Significant Event (Signed)
Saw the patient at approximately 0300 because family member was concerned with multiple episodes of vomiting.  Per her sister, the patient had a bite of food at lunch which was followed subsequently by vomiting brown fluid.  At dinnertime, the patient had a bite of food as well and vomited again.  The sister says overall the patient has vomited 4-5 times, all brown fluid.  Pt has had bites of a brown pureed food. The patient does not have an appetite currently and is not tolerating dysphagia diet well.  Patient currently denies any abdominal pain. She received 1 dose of Zofran by the day team on 8/25.  There is no sign of infection, vital signs have been stable, abdominal exam is benign. Pt has been shivering while in the room. She also has not been urinating much (two times yesterday with net positive output of 1800 ml, output of 0.22m/kg/hr) and has been receiving fluids. Most likely the pt is not tolerating her diet.  However we will rule out infectious etiology, UA pending and we will continue with the bladder scan.  Currently patient has a pure wick. We will continue with another dose of Zofran to alleviate the vomiting.  QTC on last EKG was 445.  Also, we have changed the dysphagia diet back to n.p.o. with sips. Asked nurse to get a rectal temperature.  We will follow-up on bladder scan and UA.

## 2020-12-04 NOTE — Progress Notes (Signed)
SLP Cancellation Note  Patient Details Name: Tina Kennedy MRN: [id number] DOB: 11-12-1950   Cancelled treatment:       Reason Eval/Treat Not Completed: Other (comment) (Pt's family has decided on comfort measures and SLP orders have been cancelled by PMT.)  Jayne Peckenpaugh I. Hardin Negus, [place], [place] Office number [phone] Pager [phone]  Horton Marshall 12/04/2020, 5:06 PM

## 2020-12-04 NOTE — Progress Notes (Signed)
Rectal temp 97.6, bladder scan 178 no complaints of distress vss pt tolerated will continue to monitor

## 2020-12-05 DIAGNOSIS — R778 Other specified abnormalities of plasma proteins: Secondary | ICD-10-CM

## 2020-12-05 DIAGNOSIS — Z515 Encounter for palliative care: Secondary | ICD-10-CM | POA: Diagnosis not present

## 2020-12-05 DIAGNOSIS — N179 Acute kidney failure, unspecified: Secondary | ICD-10-CM | POA: Diagnosis not present

## 2020-12-05 DIAGNOSIS — K920 Hematemesis: Secondary | ICD-10-CM

## 2020-12-05 DIAGNOSIS — R634 Abnormal weight loss: Secondary | ICD-10-CM | POA: Diagnosis not present

## 2020-12-05 MED ORDER — MORPHINE SULFATE (PF) 2 MG/ML IV SOLN: 2.0000 mg | INTRAVENOUS | 0 refills | Status: AC | PRN

## 2020-12-05 MED ORDER — HALOPERIDOL 2 MG PO TABS: 2.0000 mg | ORAL_TABLET | Freq: Four times a day (QID) | ORAL | Status: AC | PRN

## 2020-12-05 MED ORDER — PROCHLORPERAZINE EDISYLATE 10 MG/2ML IJ SOLN: 10.0000 mg | INTRAMUSCULAR | Status: AC | PRN

## 2020-12-05 MED ORDER — HALOPERIDOL LACTATE 5 MG/ML IJ SOLN: 2.0000 mg | Freq: Four times a day (QID) | INTRAMUSCULAR | Status: AC | PRN

## 2020-12-05 MED ORDER — GLYCOPYRROLATE 0.2 MG/ML IJ SOLN: 0.2000 mg | INTRAMUSCULAR | Status: AC | PRN

## 2020-12-05 MED ORDER — LORAZEPAM 2 MG/ML IJ SOLN: 1.0000 mg | INTRAMUSCULAR | 0 refills | Status: AC | PRN

## 2020-12-05 MED ORDER — HALOPERIDOL LACTATE 2 MG/ML PO CONC: 2.0000 mg | Freq: Four times a day (QID) | ORAL | 0 refills | Status: AC | PRN

## 2020-12-05 MED ORDER — ONDANSETRON HCL 4 MG/2ML IJ SOLN: 4.0000 mg | Freq: Four times a day (QID) | INTRAMUSCULAR | 0 refills | Status: AC | PRN

## 2020-12-05 MED ORDER — ENSURE ENLIVE PO LIQD: 237.0000 mL | Freq: Every day | ORAL | 12 refills | Status: AC | PRN

## 2020-12-05 MED ORDER — GLYCOPYRROLATE 1 MG PO TABS: 1.0000 mg | ORAL_TABLET | ORAL | Status: AC | PRN

## 2020-12-05 MED ORDER — CHLORHEXIDINE GLUCONATE CLOTH 2 % EX PADS: 6.0000 | MEDICATED_PAD | Freq: Every day | CUTANEOUS | Status: AC

## 2020-12-05 MED ORDER — CHLORHEXIDINE GLUCONATE CLOTH 2 % EX PADS
6.0000 | MEDICATED_PAD | Freq: Every day | CUTANEOUS | Status: DC
  Administered 2020-12-05: 6 via TOPICAL

## 2020-12-05 MED ORDER — ACETAMINOPHEN 325 MG PO TABS: 650.0000 mg | ORAL_TABLET | Freq: Four times a day (QID) | ORAL | Status: AC | PRN

## 2020-12-05 MED ORDER — HEPARIN SOD (PORK) LOCK FLUSH 100 UNIT/ML IV SOLN
500.0000 [IU] | INTRAVENOUS | Status: AC | PRN
  Administered 2020-12-05: 500 [IU]
  Filled 2020-12-05: qty 5

## 2020-12-05 MED ORDER — LORAZEPAM 2 MG/ML PO CONC: 1.0000 mg | ORAL | 0 refills | Status: AC | PRN

## 2020-12-05 MED ORDER — LORAZEPAM 1 MG PO TABS: 1.0000 mg | ORAL_TABLET | ORAL | 0 refills | Status: AC | PRN

## 2020-12-05 MED ORDER — ONDANSETRON 4 MG PO TBDP: 4.0000 mg | ORAL_TABLET | Freq: Four times a day (QID) | ORAL | 0 refills | Status: AC | PRN

## 2020-12-05 MED ORDER — POLYVINYL ALCOHOL 1.4 % OP SOLN: 1.0000 [drp] | Freq: Four times a day (QID) | OPHTHALMIC | 0 refills | Status: AC | PRN

## 2020-12-05 NOTE — Progress Notes (Addendum)
Daily Progress Note   Patient Name: Tina Kennedy       Date: 12/05/2020 DOB: [birth year]  Age: 70 y.o. MRN#: [id number] Attending Physician: Martyn Malay, MD Primary Care Physician: Patient, No Pcp Per (Inactive) Admit Date: 12/01/2020  Reason for Consultation/Follow-up: Disposition, Non pain symptom management, Pain control, Psychosocial/spiritual support, and Terminal Care  Subjective: Was notified by RN of concern that patient was no longer stable for transport. Chart review performed.   Went to visit patient at bedside - ex-husband/Glen present. Patient was lying in bed - she does not wake to voice/gentle touch. She is very ill and frail appearing. No signs or non-verbal gestures of pain or discomfort noted. No respiratory distress or secretions; slight increased work of breathing noted. At this time, she does appear stable for transfer; however, window for low risk of passing during transport is likely closing very soon. Discussed this with Monica Martinez - he expressed understanding. Prognostication was reviewed.   Emotional support and therapeutic listening provided to [place]. He questioned if her current state was due to her cancer. Reviewed that per imaging during this hospitalization, we did not see any recurrence of her head/neck cancer. Reviewed that medical team feel her decline is possibly related to undiagnosed dementia - natural disease trajectory of dementia reviewed. We reviewed specific indicators of end stage dementia, including inability to communicate, bed bound/non-ambulatory status, decreased oral intake, swallowing dysfunction, and incontinence of bowel/bladder. He states that he has seen some of these signs in the patient over the last several months.   Discussed with  RN staff that patient is likely still stable for transport at this time. Informed them I would check on patient again before I leave for my shift to determine if she was still stable. Unit staff called [facility] on update on arrive, stated that patient was 7th on their list. RN's had recently given morphine for patient's increased work of breathing - can also give ativan if needed.   Went back to patient's room to notify Monica Martinez patient was 7th on transport list. Patient's sister/Diane and cousin/Ann arrived. Reviewed plan for transfer to [facility] today if she remains stable. Prognostication was reviewed per their request - family were understandably tearful. Offered chaplain services - they were agreeable and appreciative. Therapeutic listening and emotional support provided.  All questions and concerns addressed. Encouraged to call with questions and/or concerns. PMT card provided.  Updated TOC, Dr. Owens Shark, and [facility] liaison that patient may not be stable for transfer if [facility] does not arrive soon. TOC was able to call [facility] and arrange for patient to move up their list - she should be picked up next. I greatly appreciate TOC assistance.   **ADDENDUM** 6:00 PM Received notification from RN that they called [facility] for update and were told patient was still 6th on their list. RN requested I reassess patient before I left. Asked RN to call and inquire why patient was not moved up their list as previously informed.   7:00 PM  Went to visit patient at bedside - as I arrived [facility] also arrived to bedside. PTAR stated they escalated patient's transport request. Ex-husband/Glen was still present - provided emotional support. Family were hopeful patient could transfer today. I feel patient can transport at this time, but will likely pass away within the next several hours to 24 hours. Reviewed with RNs that patient will discharge to [facility].    Length of Stay: 4  Current Medications: Scheduled Meds:  .  antiseptic oral rinse  15 mL Topical BID  . Chlorhexidine Gluconate Cloth  6 each Topical Daily  . sodium chloride flush  10-40 mL Intracatheter Q12H    Continuous Infusions: . dextrose 5 % and 0.45% NaCl 30 mL/hr at 12/05/20 0532    PRN Meds: acetaminophen **OR** acetaminophen, feeding supplement, glycopyrrolate **OR** glycopyrrolate **OR** glycopyrrolate, haloperidol **OR** haloperidol **OR** haloperidol lactate, heparin lock flush, LORazepam **OR** LORazepam **OR** LORazepam, morphine injection, ondansetron **OR** ondansetron (ZOFRAN) IV, polyvinyl alcohol, prochlorperazine, sodium chloride flush  Physical Exam Vitals and nursing note reviewed.  Constitutional:      General: She is not in acute distress.    Appearance: She is cachectic. She is ill-appearing.  Pulmonary:     Effort: No respiratory distress.     Comments: Slight increased work of breathing Skin:    General: Skin is cool and dry.  Neurological:     Mental Status: She is unresponsive.     Motor: Weakness present.  Psychiatric:        Speech: She is noncommunicative.            Vital Signs: BP (!) 92/55 (BP Location: Left Arm)   Pulse (!) 101   Temp 97.8 F (36.6 C) (Oral)   Resp 16   Ht '5\' 4"'$  (1.626 m)   Wt 31.9 kg   SpO2 90%   BMI 12.07 kg/m  SpO2: SpO2: 90 % O2 Device: O2 Device: Room Air O2 Flow Rate: O2 Flow Rate (L/min): 4 L/min  Intake/output summary:  Intake/Output Summary (Last 24 hours) at 12/05/2020 1614 Last data filed at 12/05/2020 1500 Gross per 24 hour  Intake 1770.55 ml  Output 0 ml  Net 1770.55 ml   LBM:   Baseline Weight: Weight: 31.8 kg Most recent weight: Weight: 31.9 kg       Palliative Assessment/Data: PPS 10%      Patient Active Problem List   Diagnosis Date Noted  . Dehydration   . Generalized weakness   . Weight loss   . Prolonged Q-T interval on ECG 08/14/2020  . AKI (acute kidney injury) ([place]) 08/12/2020  . Hyponatremia 08/12/2020  . Hypokalemia 08/12/2020   . Abdominal pain 08/12/2020  . Protein-calorie malnutrition, severe 08/12/2020  . NSTEMI (non-ST elevated myocardial infarction) ([place]) 08/11/2020  . Hyperbilirubinemia 07/21/2020  . Erythrocytosis  07/21/2020  . COVID-19 virus infection 07/21/2020  . Port-A-Cath in place 12/26/2019  . Carcinoma of posterior hypopharyngeal wall ([place]) 11/16/2019  . Goals of care, counseling/discussion 10/31/2019  . Hypopharyngeal cancer ([place]) 10/28/2019    Palliative Care Assessment & Plan   Patient Profile: 70 y.o. 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  She was last seen by oncology in March 2021 with plan for observation and surveillance. She was recommended to follow-up in May, but did not show up for this appointment.  In the ED, labs significant for creatinine of 1.84 and troponin of 77. CT head negative for acute findings but does show mild global parenchymal volume loss and chronic white matter microangiopathy. Chest x-ray negative for acute findings.  Assessment: Generalized weakness Severe malnutrition Failure to thrive AKI Pancytopenia History of head and neck cancer with no current suspicion for recurrence Confusion, ?dementia Terminal care  Recommendations/Plan: Continue full comfort measures Continue DNR/DNI as previously documented Currently waiting on [facility] for transfer to [facility]. Patient seems stable for transfer at this time; however, window for low risk of passing during transport is likely closing very soon - would recommend hospital death if patient becomes unstable Continue current comfort focused medication regimen Chaplain paged - requested he visit with family per their request during this emotional time Discontinue IVF at discharge PMT will continue to follow and support  holistically   Goals of Care and Additional Recommendations: Limitations on Scope of Treatment: Full Comfort Care  Code Status:    Code Status Orders  (From admission, onward)           Start     Ordered   12/04/20 1543  Do not attempt resuscitation (DNR)  Continuous       Question Answer Comment  In the event of cardiac or respiratory ARREST Do not call a "code blue"   In the event of cardiac or respiratory ARREST Do not perform Intubation, CPR, defibrillation or ACLS   In the event of cardiac or respiratory ARREST Use medication by any route, position, wound care, and other measures to relive pain and suffering. May use oxygen, suction and manual treatment of airway obstruction as needed for comfort.      12/04/20 1552           Code Status History     Date Active Date Inactive Code Status Order ID Comments User Context   12/02/2020 1605 12/04/2020 1552 DNR [id number]  Wells Guiles, DO Inpatient   12/02/2020 0014 12/02/2020 1605 Full Code [id number]  Lattie Haw, MD ED   12/01/2020 2358 12/02/2020 0014 DNR [id number]  Lattie Haw, MD ED   08/11/2020 2119 08/14/2020 2149 Full Code [id number]  Jonnie Finner, DO ED   07/21/2020 2320 07/23/2020 2053 Full Code [id number]  Kerney Elbe, DO ED       Prognosis:  Hours - Days  Discharge Planning: Hospice facility  Care plan was discussed with primary RN, TOC, Dr. Owens Shark, [facility] liaison, patient's family  Thank you for allowing the Palliative Medicine Team to assist in the care of this patient.   Total Time 65 minutes Prolonged Time Billed  no       Greater than 50%  of this time was spent counseling and coordinating care related to the above assessment and plan.  Lin Landsman, NP  Please  contact Palliative Medicine Team phone at [phone] for questions and concerns.

## 2020-12-05 NOTE — Plan of Care (Signed)
  Problem: Health Behavior/Discharge Planning: Goal: Ability to manage health-related needs will improve Outcome: Not Progressing   Problem: Clinical Measurements: Goal: Ability to maintain clinical measurements within normal limits will improve Outcome: Not Progressing Goal: Will remain free from infection Outcome: Not Progressing Goal: Diagnostic test results will improve Outcome: Not Progressing Goal: Respiratory complications will improve Outcome: Not Progressing Goal: Cardiovascular complication will be avoided Outcome: Not Progressing   Problem: Activity: Goal: Risk for activity intolerance will decrease Outcome: Not Progressing   Problem: Nutrition: Goal: Adequate nutrition will be maintained Outcome: Not Progressing   Problem: Coping: Goal: Level of anxiety will decrease Outcome: Not Progressing   Problem: Elimination: Goal: Will not experience complications related to bowel motility Outcome: Not Progressing Goal: Will not experience complications related to urinary retention Outcome: Not Progressing   Problem: Pain Managment: Goal: General experience of comfort will improve Outcome: Not Progressing   Problem: Safety: Goal: Ability to remain free from injury will improve Outcome: Not Progressing   Problem: Skin Integrity: Goal: Risk for impaired skin integrity will decrease Outcome: Not Progressing   

## 2020-12-05 NOTE — Progress Notes (Signed)
Talked patient's RN that patient need order for remain port of cath with D/C to [facility]. Patient's RN said that patient is not stable to transfer at this time. Patient's RN will put in the order if patient is going to [facility]. HS [employer]

## 2020-12-05 NOTE — Progress Notes (Addendum)
FPTS Brief Progress Note  S: Patient was sleeping in bed, woke with the patient, and she denies any complaints at this time.  She has no pain and denies chest pain abdominal pain, or shortness of breath.    O: BP (!) 89/52 (BP Location: Right Arm)   Pulse (!) 101   Temp 98 F (36.7 C)   Resp 19   Ht '5\' 4"'$  (1.626 m)   Wt 31.9 kg   SpO2 96%   BMI 12.07 kg/m   General: Resting comfortably in bed in no acute distress  A/P: Plan per day team - Orders reviewed. Labs for AM not ordered, which was adjusted as needed.  -Patient has transitioned to comfort care and will go to [facility]  -No new updates at this time  Erskine Emery, MD 12/05/2020, 12:42 AM PGY-1, [facility] Family Medicine Night Resident  Please page [phone] with questions.

## 2020-12-05 NOTE — Progress Notes (Signed)
Manufacturing engineer [facility])  [facility] has a bed for Ms. Neuwirth today.   Sister [name] updated and would like to accept the bed today.  Necessary consents have to be completed prior to transport being arranged. Once these are finished, [facility] will update TOC manager to schedule transport.  RN staff, you may call report to [phone] at any time. Room is assigned when report is called.   Please leave foley in place and leave port accessed (but discontinue IV fluids).  Please send completed DNR.  Thank you, Venia Carbon RN, BSN, [facility] Liaison

## 2020-12-05 NOTE — Progress Notes (Signed)
Manufacturing engineer [facility]) [facility]  Received chat regarding stability of Tina Kennedy to transfer to [facility].  Should decision be made for a hospital death, please call [facility] to make them aware that she will not be coming, [phone].  Thank you, Venia Carbon RN, BSN, [facility] Liaison

## 2020-12-05 NOTE — Progress Notes (Signed)
[facility] called to [facility]. Spoke to [employer], Therapist, sports. All questions answered.

## 2020-12-05 NOTE — Progress Notes (Signed)
Brief Palliative Medicine Progress Note:  Chart reviewed. Noted Tina Kennedy has offered patient a bed today and family have accepted.  No PMT needs at this time.  Thank you for allowing PMT to assist in the care of this patient.  Tina Kennedy M. Tamala Julian [facility] Palliative Medicine Team Team Phone: [phone] NO CHARGE

## 2020-12-05 NOTE — Progress Notes (Signed)
Family Medicine Teaching Service Daily Progress Note Intern Pager: [phone]  Patient name: Neytiri Vanpelt Medical record number: [id number] Date of birth: [birth year] Age: 70 y.o. Gender: female  Primary Care Provider: Patient, No Pcp Per (Inactive) Consultants: Cardiology, heme-onc, palliative Code Status: DNR  Pt Overview and Major Events to Date:  8/23: admitted 8/24: CT negative for cancer recurrence 8/25: continued conversation between palliative and family, emesis episode while eating 8/26: Decided to continue with comfort care measures  Assessment and Plan:  Demeshia Kessa Ledwith is a 70 year old female presenting with weakness onset 3 to 4 weeks ago with significant weight loss between 30 to 40 pounds in the last month.  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  There is no current suspicion for recurrence given the findings.  Multiple etiologies have been ruled out at this time.  Adjusted MoCA performed without visual components was scored 10/24.  Patient has a significant degree of impairment likely 2/2 dementia.  Currently the patient is experiencing severe's fatigue, disinterest in eating, and a deteriorating mental status.  Patient's family and palliative care had goals of care discussions in depth.  Decision was made to continue with full comfort measures. -Palliative following, appreciate recs -Continue DNR/DNI -Family prefers [facility] for placement and we will continue to work on this -Palliative has provided orders for EOL management full comfort measures -IV  fluids to be continued until discharge at a decreased rate of 30 mL/hour, family understands this will not continue at [place] with comfort feeds -Unrestricted visitation -Frequent assessments and as needed medications per palliative  History of dysphagia  severe malnutrition/failure to thrive  refeeding syndrome  folate deficiency As we have decided to continue with comfort care measures at this time, palliative discussed the option of comfort care feeds with the patient family and they understand the risk of aspiration/nausea/vomiting.  Family would like to continue with comfort care feeds at this time. -IVF continued until discharge at a decreased rate -Comfort care feeds  FEN/GI: Comfort Care feeds, regular diet PPx: None  Dispo: [facility] pending placement   Subjective:  Pt reports that she has had no pain and is sleeping well.   Objective: Temp:  [97.5 F (36.4 C)-98.1 F (36.7 C)] 98.1 F (36.7 C) (08/27 0502) Pulse Rate:  [78-102] 102 (08/27 0502) Resp:  [18-20] 18 (08/27 0502) BP: (89-96)/(52-67) 94/59 (08/27 0502) SpO2:  [89 %-96 %] 89 % (08/27 0502) Physical Exam Constitutional:      Appearance: She is ill-appearing.     Comments: Cachetic and malnourished   Cardiovascular:     Rate and Rhythm: Normal rate and regular rhythm.  Pulmonary:     Effort: Pulmonary effort is normal.  Abdominal:     General: Bowel sounds are normal. There is no distension.     Palpations: Abdomen is soft.  Skin:    General: Skin is warm and dry.  Neurological:     Mental Status: She is alert.     Laboratory: Recent Labs  Lab 12/02/20 0339 12/03/20 0443 12/04/20 0535  WBC 4.3 2.7* 6.5  HGB 11.2* 11.4* 9.6*  HCT 32.2* 31.6* 26.5*  PLT 82* 54*  45*   Recent Labs  Lab 12/01/20 1439 12/02/20 0339 12/03/20 0443 12/03/20 1647 12/04/20 0535  NA 139 141 136 136 133*  K 4.2 4.1 2.9* 4.0 3.6  CL 102 107 107 107 103  CO2 19* 22 19* 19* 21*  BUN 39* 37* 26*  22 20  CREATININE 1.84* 1.66* 1.14* 1.09* 1.09*  CALCIUM 9.2 8.6* 7.9* 8.1* 7.4*  PROT 6.8 6.4*  --   --   --   BILITOT 1.6* 1.4*  --   --   --   ALKPHOS 80 66  --   --   --   ALT 16 15  --   --   --   AST 27 22  --   --   --   GLUCOSE 75 74 172* 116* 220*     Erskine Emery, MD 12/05/2020, 5:29 AM PGY-1, [place] Intern pager: [phone], text pages welcome

## 2020-12-05 NOTE — Progress Notes (Signed)
Pt unresponsive family at bedside. They requested prayers be said. The chaplain offered caring and supportive presence, prayers and blessings.

## 2020-12-05 NOTE — Discharge Summary (Signed)
[facility] Discharge Summary  Patient name: Tina Kennedy Medical record number: [id number] Date of birth: 04/11/1951 Age: 70 y.o. Gender: female Date of Admission: 12/01/2020  Date of Discharge: 12/05/2020 Admitting Physician: Kinnie Feil, MD  Primary Care Provider: Patient, No Pcp Per (Inactive) Consultants: Cardiology, heme-onc, palliative  Indication for Hospitalization: Generalized weakness and altered mental status with significant weight loss  Discharge Diagnoses/Problem List:  Active Problems:   Hypopharyngeal cancer (Ray)   Disposition: [facility]  Discharge Condition: Stable  Discharge Exam:  Blood pressure (!) 92/55, pulse (!) 101, temperature 97.8 F (36.6 C), temperature source Oral, resp. rate 16, height '5\' 4"'$  (1.626 m), weight 31.9 kg, SpO2 90 %.  Physical Exam Constitutional:      Appearance: She is ill-appearing.     Comments: Cachetic and malnourished   Cardiovascular:     Rate and Rhythm: Normal rate and regular rhythm.  Pulmonary:     Effort: Pulmonary effort is normal.  Abdominal:     General: Bowel sounds are normal. There is no distension.     Palpations: Abdomen is soft.  Skin:    General: Skin is warm and dry.  Neurological:     Mental Status: She is alert.   Brief Hospital Course:  Tina Kennedy is a 70 y.o. female presenting with onset of weakness onset 3-4 weeks ago. 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  Hospital course as outlined below.   Generalized Weakness likely 2/2 dementia  Tina Kennedy is a 70 yr old female who presented with 3-4 week history of generalized weakness and decline and significant weight  loss.  Initially there was concern for recurrence of the cancer that she has had SCC of the hypopharynx. However, none of the work-up and scans that were performed showed any evidence that the cancer had recurred.  Neck CT head CT chest x-ray and CT were all unremarkable.  The patient has had confusion that has seems to have worsened over the last couple of months.  Vitamin B12 TSH and RPR normal.  Folate was a little low at 4.4. Work-up was done for recurrence of cancer during this admission.  There is no current suspicion for recurrence given the findings.  Multiple etiologies have been ruled out at this time.  Adjusted MoCA performed without visual components was scored 10/24.  Patient has a significant degree of impairment likely 2/2 dementia.  Currently the patient is experiencing severe's fatigue, disinterest in eating, and a deteriorating mental status.  Patient's family and palliative care had goals of care discussions in depth.  Decision was made to continue with full comfort measures.  History of dysphagia  severe malnutrition/failure to thrive  refeeding syndrome  folate deficiency Presented with BMI 12.  Electrolytes on presentation within normal limits but now requiring repletion and fluids.  Required repletion of potassium and phosphorus thus far.  Started receiving D5 1/2NS at 184m/hr. Changed to dysphagia 1 diet SLP evaluation.  Also presenting with folate deficiency.   Patient underwent episode of vomiting after changed to dysphagia 1 diet and was unable to tolerate eating.  Placed on n.p.o. overnight.  Electrolytes were repleted as necessary. Abdominal exam unremarkable.  Investigation of etiology was discontinued due to decision to continue with full comfort measures by  family with palliative care team.  Continued with IV fluids at a rate of 30 mL/hour.  Continued with comfort care feeds as well.   Elevated troponins  H/O NSTEMI  Pt denied chest pain on this admission. Pt was admitted  in May 2022 for NSTEMI with troponin of 1016.  At that time she had an EKG that showed sinus rhythm with no clear ST elevations, though it was treated as NSTEMI. 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  Echocardiogram on 08/11/2020 showed an ejection fraction of 60 to 65% with grade 1 diastolic dysfunction. On discharge from that admission patient was discharged with metoprolol succinate 12.5 mg daily and Losartan 25 mg daily but has not been taking any medications at this time. Troponins in the ED 77>90>80>85. EKG showed NSR with no ST changes. Spoke to cardiology, elevated trops likely were elevated due to AKI and they did not feel the need to be consulted. Trops were flat, so there was no need to trend them.   AKI, resolved Likely prerenal consistent with malnutrition.  CMP on admission showed electrolytes WNL.  Creatinine 1.84 from baseline 0.91.  Creatinine did trend downward with a final creatinine of 1.09, and GFR 55.  As mentioned above, comfort care measures were started.     Significant Labs and Imaging:  Recent Labs  Lab 12/02/20 0339 12/03/20 0443 12/04/20 0535  WBC 4.3 2.7* 6.5  HGB 11.2* 11.4* 9.6*  HCT 32.2* 31.6* 26.5*  PLT 82* 54* 45*   Recent Labs  Lab 12/01/20 1439 12/01/20 2045 12/02/20 0339 12/03/20 0443 12/03/20 1115 12/03/20 1647 12/04/20 0535 12/04/20 1417  NA 139  --  141 136  --  136 133*  --   K 4.2  --  4.1 2.9*  --  4.0 3.6  --   CL 102  --  107 107  --  107 103  --   CO2 19*  --  22 19*  --  19* 21*  --   GLUCOSE 75  --  74 172*  --  116* 220*  --   BUN 39*  --  37* 26*  --  22 20  --   CREATININE 1.84*  --  1.66* 1.14*  --  1.09* 1.09*  --   CALCIUM 9.2  --  8.6* 7.9*  --  8.1* 7.4*  --   MG  --    < > 2.2 2.0 1.9  --  1.6* 1.9   PHOS  --   --  3.4  --   --  1.1* 5.6* 4.7*  ALKPHOS 80  --  66  --   --   --   --   --   AST 27  --  22  --   --   --   --   --   ALT 16  --  15  --   --   --   --   --   ALBUMIN 3.1*  --  3.0*  --   --  2.7*  --   --    < > = values in this interval not displayed.     Discharge Medications:  Allergies as of 12/05/2020   No Known Allergies      Medication List     STOP  taking these medications    losartan 25 MG tablet Commonly known as: COZAAR   metoprolol succinate 25 MG 24 hr tablet Commonly known as: TOPROL-XL   polyethylene glycol 17 g packet Commonly known as: MIRALAX / GLYCOLAX       TAKE these medications    acetaminophen 325 MG tablet Commonly known as: TYLENOL Take 2 tablets (650 mg total) by mouth every 6 (six) hours as needed for mild pain (or Fever >/= 101).   Chlorhexidine Gluconate Cloth 2 % Pads Apply 6 each topically daily. Start taking on: December 06, 2020   feeding supplement Liqd Take 237 mLs by mouth daily as needed (as desires for EOL). What changed:  when to take this reasons to take this   glycopyrrolate 1 MG tablet Commonly known as: ROBINUL Take 1 tablet (1 mg total) by mouth every 4 (four) hours as needed (excessive secretions).   glycopyrrolate 0.2 MG/ML injection Commonly known as: ROBINUL Inject 1 mL (0.2 mg total) into the skin every 4 (four) hours as needed (excessive secretions).   glycopyrrolate 0.2 MG/ML injection Commonly known as: ROBINUL Inject 1 mL (0.2 mg total) into the vein every 4 (four) hours as needed (excessive secretions).   haloperidol 2 MG tablet Commonly known as: HALDOL Take 1 tablet (2 mg total) by mouth every 6 (six) hours as needed for agitation (or delirium).   haloperidol 2 MG/ML solution Commonly known as: HALDOL Place 1 mL (2 mg total) under the tongue every 6 (six) hours as needed for agitation (or delirium).   haloperidol lactate 5 MG/ML injection Commonly known as: HALDOL Inject 0.4 mLs  (2 mg total) into the vein every 6 (six) hours as needed (or delirium).   LORazepam 1 MG tablet Commonly known as: ATIVAN Take 1 tablet (1 mg total) by mouth every hour as needed for anxiety, seizure, sedation or sleep (distress, respiratory rate >25, increased work of breathing).   LORazepam 2 MG/ML concentrated solution Commonly known as: ATIVAN Place 0.5 mLs (1 mg total) under the tongue every hour as needed for anxiety, seizure, sedation or sleep (distress, respiratory rate >25, increased work of breathing).   LORazepam 2 MG/ML injection Commonly known as: ATIVAN Inject 0.5 mLs (1 mg total) into the vein every hour as needed for anxiety, seizure or sedation (distress, respiratory rate >25, increased work of breathing).   morphine 2 MG/ML injection Inject 1 mL (2 mg total) into the vein every 2 (two) hours as needed (or dyspnea, distress, respiratory rate >25, increased work of breathing).   ondansetron 4 MG disintegrating tablet Commonly known as: ZOFRAN-ODT Take 1 tablet (4 mg total) by mouth every 6 (six) hours as needed for nausea.   ondansetron 4 MG/2ML Soln injection Commonly known as: ZOFRAN Inject 2 mLs (4 mg total) into the vein every 6 (six) hours as needed for nausea.   polyvinyl alcohol 1.4 % ophthalmic solution Commonly known as: LIQUIFILM TEARS Place 1 drop into both eyes 4 (four) times daily as needed for dry eyes.   prochlorperazine 10 MG/2ML injection Commonly known as: COMPAZINE Inject 2 mLs (10 mg total) into the vein every 4 (four) hours as needed.        Discharge Instructions: Please refer to Patient Instructions section of EMR for full details.  Patient was counseled important signs and symptoms that should prompt return to medical care, changes in medications, dietary instructions, activity restrictions, and follow up appointments.   Follow-Up Appointments: N/A  Orvis Brill, DO 12/05/20  1:04pm PGY-1

## 2020-12-10 DEATH — deceased

## 2020-12-15 ENCOUNTER — Ambulatory Visit: Payer: Medicare Other | Admitting: Radiation Oncology

## 2022-05-05 IMAGING — CT NM PET TUM IMG INITIAL (PI) SKULL BASE T - THIGH
1 of 7 series · 1 of 25 positions shown · non-contrast
Comparison: Neck CT 09/13/2019

CLINICAL DATA: Initial treatment strategy for staging of
hypopharyngeal cancer.

EXAM:
NUCLEAR MEDICINE PET SKULL BASE TO THIGH
TECHNIQUE: 5.5 mCi F-18 FDG was injected intravenously. Full-ring PET imaging
was performed from the skull base to thigh after the radiotracer. CT
data was obtained and used for attenuation correction and anatomic
localization.
Fasting blood glucose: 112 mg/dl

[Series 4: ct [id number] 5.0 b31f · axial · 5.0mm · 0.98mm/px · 1 of 207 slices shown]
[im 207/207  brain]
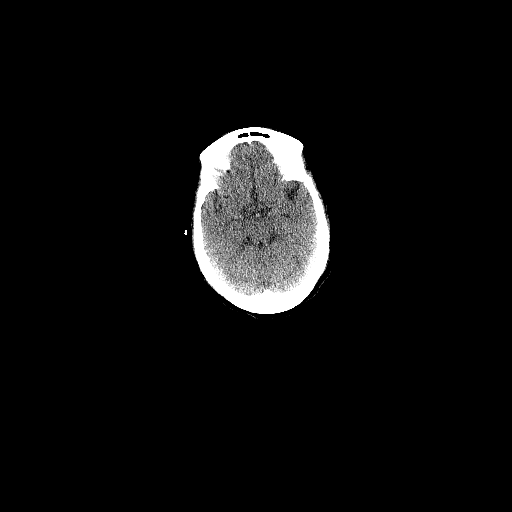

[1 of 25 positions shown; findings below may reference images not displayed]

FINDINGS: Mediastinal blood pool activity: SUV max

Liver activity: SUV max NA

NECK: Right retropharyngeal node with corresponding hypermetabolism.
Example 2.3 x 2.1 cm and a S.U.V. max of 17.4 on [DATE]. This is
relatively similar to on the prior diagnostic CT.

Bulky proximal esophageal mass measures a S.U.V. max of 33.0,
including on 33/4.

Incidental CT findings: Bilateral carotid atherosclerosis. No
interval finding since the prior diagnostic CT.

CHEST: No pulmonary parenchymal or thoracic nodal hypermetabolism.

Incidental CT findings: Aortic atherosclerosis. Lad coronary artery
atherosclerosis. Centrilobular and paraseptal emphysema. Vague
ground-glass nodularity along the right minor fissure at 11 mm on
70/4.

ABDOMEN/PELVIS: No abdominopelvic nodal hypermetabolism. Low anal
hypermetabolism measures a S.U.V. max of 11.5. This area
suboptimally evaluated at CT secondary to pelvic floor laxity and
suggestion of rectal prolapse.

Incidental CT findings: Normal adrenal glands. Abdominal aortic
atherosclerosis. Large amount of stool in the rectum.

SKELETON: No abnormal marrow activity.

Incidental CT findings: Mild osteopenia. Nonacute lateral left rib
fractures.
IMPRESSION: 1. Proximal esophageal primary with right retropharyngeal nodal
metastasis.
2. No evidence of extracervical metastatic disease.
3. Low anal hypermetabolism which could be physiologic or
neoplastic. Consider physical exam correlation.
4. A right-sided ground-glass nodule is nonspecific, warranting
follow-up attention.
5. Incidental findings, including: Aortic atherosclerosis
([id number]-DVF.F), coronary artery atherosclerosis and emphysema
([id number].H).
